# Patient Record
Sex: Female | Born: 1980 | Race: Black or African American | Hispanic: No | Marital: Married | Smoking: Never smoker
Health system: Southern US, Community
[De-identification: ages and names within clinical notes are randomized; demographics above are authoritative.]

## PROBLEM LIST (undated history)

## (undated) DIAGNOSIS — K8050 Calculus of bile duct without cholangitis or cholecystitis without obstruction: Secondary | ICD-10-CM

## (undated) DIAGNOSIS — R002 Palpitations: Secondary | ICD-10-CM

## (undated) DIAGNOSIS — E282 Polycystic ovarian syndrome: Secondary | ICD-10-CM

## (undated) DIAGNOSIS — R030 Elevated blood-pressure reading, without diagnosis of hypertension: Secondary | ICD-10-CM

## (undated) DIAGNOSIS — Z8619 Personal history of other infectious and parasitic diseases: Secondary | ICD-10-CM

## (undated) DIAGNOSIS — G4733 Obstructive sleep apnea (adult) (pediatric): Principal | ICD-10-CM

## (undated) DIAGNOSIS — E785 Hyperlipidemia, unspecified: Secondary | ICD-10-CM

## (undated) DIAGNOSIS — E538 Deficiency of other specified B group vitamins: Principal | ICD-10-CM

## (undated) DIAGNOSIS — I10 Essential (primary) hypertension: Secondary | ICD-10-CM

## (undated) HISTORY — DX: Personal history of other infectious and parasitic diseases: Z86.19

## (undated) HISTORY — DX: Deficiency of other specified B group vitamins: E53.8

## (undated) HISTORY — PX: TONSILLECTOMY: SUR1361

## (undated) HISTORY — PX: WISDOM TOOTH EXTRACTION: SHX21

## (undated) HISTORY — DX: Elevated blood-pressure reading, without diagnosis of hypertension: R03.0

## (undated) HISTORY — DX: Essential (primary) hypertension: I10

## (undated) HISTORY — DX: Calculus of bile duct without cholangitis or cholecystitis without obstruction: K80.50

## (undated) HISTORY — DX: Obstructive sleep apnea (adult) (pediatric): G47.33

## (undated) HISTORY — DX: Hyperlipidemia, unspecified: E78.5

## (undated) HISTORY — DX: Palpitations: R00.2

## (undated) HISTORY — PX: DILATION AND CURETTAGE OF UTERUS: SHX78

---
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
## 2009-04-26 ENCOUNTER — Ambulatory Visit (HOSPITAL_COMMUNITY): Admission: RE | Admit: 2009-04-26 | Discharge: 2009-04-26 | Payer: Self-pay | Admitting: Obstetrics and Gynecology

## 2009-04-26 ENCOUNTER — Encounter (INDEPENDENT_AMBULATORY_CARE_PROVIDER_SITE_OTHER): Payer: Self-pay | Admitting: Obstetrics and Gynecology

## 2010-09-20 LAB — BASIC METABOLIC PANEL
BUN: 6 mg/dL (ref 6–23)
Chloride: 105 mEq/L (ref 96–112)
GFR calc Af Amer: >60 mL/min (ref >60)
GFR calc non Af Amer: >60 mL/min (ref >60)
Potassium: 3.4 mEq/L — ABNORMAL LOW (ref 3.5–5.1)

## 2010-09-20 LAB — CBC
HCT: 35.5 % — ABNORMAL LOW (ref 36.0–46.0)
MCV: 78.4 fL (ref 78.0–100.0)
Platelets: 379 10*3/uL (ref 150–400)
RBC: 4.52 MIL/uL (ref 3.87–5.11)
WBC: 9.3 10*3/uL (ref 4.0–10.5)

## 2010-09-20 LAB — PREGNANCY, URINE: Preg Test, Ur: NEGATIVE

## 2010-10-30 ENCOUNTER — Emergency Department (HOSPITAL_BASED_OUTPATIENT_CLINIC_OR_DEPARTMENT_OTHER)
Admission: EM | Admit: 2010-10-30 | Discharge: 2010-10-30 | Disposition: A | Discharge status: Home / Self Care | Payer: Managed Care, Other (non HMO) | Attending: Emergency Medicine | Admitting: Emergency Medicine

## 2010-10-30 DIAGNOSIS — L089 Local infection of the skin and subcutaneous tissue, unspecified: Secondary | ICD-10-CM | POA: Insufficient documentation

## 2010-10-30 LAB — GLUCOSE, CAPILLARY

## 2011-03-19 LAB — HM PAP SMEAR: HM Pap smear: NORMAL

## 2011-05-15 ENCOUNTER — Emergency Department (HOSPITAL_BASED_OUTPATIENT_CLINIC_OR_DEPARTMENT_OTHER)
Admission: EM | Admit: 2011-05-15 | Discharge: 2011-05-15 | Disposition: A | Discharge status: Home / Self Care | Payer: Managed Care, Other (non HMO) | Attending: Emergency Medicine | Admitting: Emergency Medicine

## 2011-05-15 ENCOUNTER — Encounter: Payer: Self-pay | Admitting: Student

## 2011-05-15 ENCOUNTER — Emergency Department (INDEPENDENT_AMBULATORY_CARE_PROVIDER_SITE_OTHER): Payer: Managed Care, Other (non HMO)

## 2011-05-15 DIAGNOSIS — R059 Cough, unspecified: Secondary | ICD-10-CM

## 2011-05-15 DIAGNOSIS — R05 Cough: Secondary | ICD-10-CM

## 2011-05-15 DIAGNOSIS — R0989 Other specified symptoms and signs involving the circulatory and respiratory systems: Secondary | ICD-10-CM

## 2011-05-15 HISTORY — DX: Polycystic ovarian syndrome: E28.2

## 2011-05-15 MED ORDER — HYDROCOD POLST-CHLORPHEN POLST 10-8 MG/5ML PO LQCR: 5.0000 mL | Freq: Two times a day (BID) | ORAL | Status: DC | PRN

## 2011-05-15 NOTE — ED Notes (Addendum)
Pt in with c/o URI s/sx x several days with associated cough,congestion, nasal congestion, aches and chills. Pt also reports sore throat and recent exposure to flu and pneumonia from co workers.

## 2011-05-15 NOTE — ED Provider Notes (Signed)
History     CSN: [id number] Arrival date & time: 05/15/2011  4:20 PM   First MD Initiated Contact with Patient 05/15/11 1627      Chief Complaint  Patient presents with  . URI    (Consider location/radiation/quality/duration/timing/severity/associated sxs/prior treatment) Patient is a 30 y.o. female presenting with URI. The history is provided by the patient. No language interpreter was used.  URI The primary symptoms include sore throat and cough. Primary symptoms do not include nausea or vomiting. The current episode started 3 to 5 days ago. This is a new problem. The problem has not changed since onset. Symptoms associated with the illness include rhinorrhea. The illness is not associated with facial pain.    Past Medical History  Diagnosis Date  . PCOS (polycystic ovarian syndrome)     Past Surgical History  Procedure Date  . Tonsillectomy   . Wisdom tooth extraction   . Dilation and curettage of uterus     No family history on file.  History  Substance Use Topics  . Smoking status: Never Smoker   . Smokeless tobacco: Not on file  . Alcohol Use: Yes    OB History    Grav Para Term Preterm Abortions TAB SAB Ect Mult Living                  Review of Systems  HENT: Positive for sore throat and rhinorrhea.   Respiratory: Positive for cough.   Gastrointestinal: Negative for nausea and vomiting.  All other systems reviewed and are negative.    Allergies  Flagyl and Other  Home Medications   Current Outpatient Rx  Name Route Sig Dispense Refill  . ZICAM COLD REMEDY PO TBDP Oral Take 1 tablet by mouth every 3 (three) hours as needed. For congestion     . MEDROXYPROGESTERONE ACETATE 10 MG PO TABS Oral Take 10 mg by mouth daily.        BP 140/76  Pulse 125  Temp(Src) 98.5 F (36.9 C) (Oral)  Resp 18  Wt 279 lb (126.554 kg)  SpO2 98%  LMP 08/14/2010  Physical Exam  Nursing note and vitals reviewed. Constitutional: She appears well-developed and  well-nourished.  HENT:  Head: Normocephalic and atraumatic.  Right Ear: External ear normal.  Left Ear: External ear normal.  Nose: Rhinorrhea present.  Mouth/Throat: Posterior oropharyngeal erythema present.  Neck: Normal range of motion. Neck supple.  Cardiovascular: Normal rate and regular rhythm.   Pulmonary/Chest: Effort normal and breath sounds normal.  Musculoskeletal: Normal range of motion.  Neurological: She is alert.    ED Course  Procedures (including critical care time)  Labs Reviewed - No data to display Dg Chest 2 View  05/15/2011  *RADIOLOGY REPORT*  Clinical Data: Cough and chest congestion.  CHEST - 2 VIEW  Comparison: None.  Findings: Normal sized heart.  Clear lungs.  Minimal scoliosis.  IMPRESSION: No acute abnormality.  Original Report Authenticated By: Darrol Angel, M.D.     1. Cough       MDM  No acute finding noted  On x-ray will treat symptomatically:symptoms likely viral        Teressa Lower, NP 05/15/11 1830

## 2011-05-16 NOTE — ED Provider Notes (Signed)
Medical screening examination/treatment/procedure(s) were performed by non-physician practitioner and as supervising physician I was immediately available for consultation/collaboration.   Darneshia Demary, MD 05/16/11 0012 

## 2011-11-12 ENCOUNTER — Encounter (HOSPITAL_BASED_OUTPATIENT_CLINIC_OR_DEPARTMENT_OTHER): Payer: Self-pay | Admitting: *Deleted

## 2011-11-12 ENCOUNTER — Emergency Department (HOSPITAL_BASED_OUTPATIENT_CLINIC_OR_DEPARTMENT_OTHER)
Admission: EM | Admit: 2011-11-12 | Discharge: 2011-11-12 | Disposition: A | Discharge status: Home / Self Care | Payer: Managed Care, Other (non HMO) | Attending: Emergency Medicine | Admitting: Emergency Medicine

## 2011-11-12 DIAGNOSIS — J040 Acute laryngitis: Secondary | ICD-10-CM | POA: Insufficient documentation

## 2011-11-12 DIAGNOSIS — J309 Allergic rhinitis, unspecified: Secondary | ICD-10-CM | POA: Insufficient documentation

## 2011-11-12 MED ORDER — FLUTICASONE PROPIONATE 50 MCG/ACT NA SUSP: 2.0000 | Freq: Every day | NASAL | Status: DC

## 2011-11-12 NOTE — ED Notes (Signed)
MD at bedside. 

## 2011-11-12 NOTE — ED Notes (Signed)
Pt amb to triage with quick steady gait in nad. Pt reports hoarse voice x 5 days, denies sore throat or fevers or any other c/o.

## 2011-11-12 NOTE — ED Provider Notes (Signed)
History     CSN: [id number]  Arrival date & time 11/12/11  1506   First MD Initiated Contact with Patient 11/12/11 1517      Chief Complaint  Patient presents with  . Hoarse    (Consider location/radiation/quality/duration/timing/severity/associated sxs/prior treatment) HPI 31 yo female with history of seasonal allergies here with complaint of hoarseness.  States that symptoms began ~4-5 days ago.  Symptoms are starting to improve  She has had nasal congestion and drainage along with mild cough.  She denies any difficulty swallowing of shortness of breath.  No nausea or reflux symptoms.  Past Medical History  Diagnosis Date  . PCOS (polycystic ovarian syndrome)     Past Surgical History  Procedure Date  . Tonsillectomy   . Wisdom tooth extraction   . Dilation and curettage of uterus     History reviewed. No pertinent family history.  History  Substance Use Topics  . Smoking status: Never Smoker   . Smokeless tobacco: Not on file  . Alcohol Use: Yes    OB History    Grav Para Term Preterm Abortions TAB SAB Ect Mult Living                  Review of Systems  Constitutional: Negative for fever.  HENT: Positive for congestion, voice change and postnasal drip. Negative for sore throat and trouble swallowing.   Respiratory: Positive for cough. Negative for chest tightness and shortness of breath.   Cardiovascular: Negative for chest pain.    Allergies  Flagyl and Other  Home Medications   Current Outpatient Rx  Name Route Sig Dispense Refill  . LOESTRIN 24 FE PO Oral Take 1 tablet by mouth daily.    Marland Kitchen FLUTICASONE PROPIONATE 50 MCG/ACT NA SUSP Nasal Place 2 sprays into the nose daily. 16 g 0  . MEDROXYPROGESTERONE ACETATE 10 MG PO TABS Oral Take 10 mg by mouth daily.        BP 153/100  Pulse 100  Temp(Src) 98.5 F (36.9 C) (Oral)  Resp 18  Ht 5\' 4"  (1.626 m)  Wt 270 lb (122.471 kg)  BMI 46.35 kg/m2  SpO2 99%  LMP 10/13/2011  Physical Exam    Constitutional: She is oriented to person, place, and time. She appears well-nourished. No distress.       Hoarseness with speaking.   HENT:  Head: Normocephalic and atraumatic.  Mouth/Throat: Oropharynx is clear and moist.  Neck: Neck supple.  Cardiovascular: Normal rate, regular rhythm and normal heart sounds.   Pulmonary/Chest: Effort normal and breath sounds normal. No respiratory distress. She has no wheezes.  Lymphadenopathy:    She has no cervical adenopathy.  Neurological: She is alert and oriented to person, place, and time.    ED Course  Procedures (including critical care time)  Labs Reviewed - No data to display No results found.   1. Allergic rhinitis   2. Laryngitis       MDM  Laryngitis likely induced by post nasal drip.  No symptoms of reflux.  Could be viral, given symptoms of congestion, mild cough.  Will give her rx for for flonase to see if this helps improve her symptoms as she likely has some allergic rhinitis contributing to post nasal drip.  3:54 PM         Everrett Coombe, DO 11/12/11 1554

## 2011-11-12 NOTE — Discharge Instructions (Signed)
Allergic Rhinitis  Allergic rhinitis is when the mucous membranes in the nose respond to allergens. Allergens are particles in the air that cause your body to have an allergic reaction. This causes you to release allergic antibodies. Through a chain of events, these eventually cause you to release histamine into the blood stream (hence the use of antihistamines). Although meant to be protective to the body, it is this release that causes your discomfort, such as frequent sneezing, congestion and an itchy runny nose.    CAUSES    The pollen allergens may come from grasses, trees, and weeds. This is seasonal allergic rhinitis, or "hay fever." Other allergens cause year-round allergic rhinitis (perennial allergic rhinitis) such as house dust mite allergen, pet dander and mold spores.    SYMPTOMS     Nasal stuffiness (congestion).   Runny, itchy nose with sneezing and tearing of the eyes.   There is often an itching of the mouth, eyes and ears.  It cannot be cured, but it can be controlled with medications.  DIAGNOSIS    If you are unable to determine the offending allergen, skin or blood testing may find it.  TREATMENT     Avoid the allergen.   Medications and allergy shots (immunotherapy) can help.   Hay fever may often be treated with antihistamines in pill or nasal spray forms. Antihistamines block the effects of histamine. There are over-the-counter medicines that may help with nasal congestion and swelling around the eyes. Check with your caregiver before taking or giving this medicine.  If the treatment above does not work, there are many new medications your caregiver can prescribe. Stronger medications may be used if initial measures are ineffective. Desensitizing injections can be used if medications and avoidance fails. Desensitization is when a patient is given ongoing shots until the body becomes less sensitive to the allergen. Make sure you follow up with your caregiver if problems continue.  SEEK  MEDICAL CARE IF:     You develop fever (more than 100.5 F (38.1 C).   You develop a cough that does not stop easily (persistent).   You have shortness of breath.   You start wheezing.   Symptoms interfere with normal daily activities.  Document Released: 02/27/2001 Document Revised: 05/24/2011 Document Reviewed: 09/08/2008  Exit[employer] Patient Information 2012 [employer].

## 2011-11-12 NOTE — ED Provider Notes (Signed)
I , reviewed the resident's note and I agree with the findings and plan.     Nelia Shi, MD 11/12/11 [phone]

## 2012-02-27 ENCOUNTER — Telehealth: Payer: Self-pay | Admitting: *Deleted

## 2012-02-27 ENCOUNTER — Telehealth: Payer: Self-pay | Admitting: Family

## 2012-02-27 ENCOUNTER — Encounter: Payer: Self-pay | Admitting: Family

## 2012-02-27 ENCOUNTER — Ambulatory Visit (INDEPENDENT_AMBULATORY_CARE_PROVIDER_SITE_OTHER): Payer: Managed Care, Other (non HMO) | Admitting: Family

## 2012-02-27 VITALS — BP 140/98 | HR 98 | Temp 98.5°F | Resp 16 | Ht 64.5 in | Wt 266.0 lb

## 2012-02-27 DIAGNOSIS — Z23 Encounter for immunization: Secondary | ICD-10-CM

## 2012-02-27 DIAGNOSIS — E282 Polycystic ovarian syndrome: Secondary | ICD-10-CM | POA: Insufficient documentation

## 2012-02-27 DIAGNOSIS — I10 Essential (primary) hypertension: Secondary | ICD-10-CM | POA: Insufficient documentation

## 2012-02-27 DIAGNOSIS — Z0000 Encounter for general adult medical examination without abnormal findings: Secondary | ICD-10-CM | POA: Insufficient documentation

## 2012-02-27 DIAGNOSIS — R030 Elevated blood-pressure reading, without diagnosis of hypertension: Secondary | ICD-10-CM

## 2012-02-27 DIAGNOSIS — R002 Palpitations: Secondary | ICD-10-CM | POA: Insufficient documentation

## 2012-02-27 DIAGNOSIS — M722 Plantar fascial fibromatosis: Secondary | ICD-10-CM | POA: Insufficient documentation

## 2012-02-27 MED ORDER — EFLORNITHINE HCL 13.9 % EX CREA: TOPICAL_CREAM | CUTANEOUS | Status: DC

## 2012-02-27 NOTE — Progress Notes (Signed)
  Subjective:    Patient ID: Tina Figueroa, female    DOB: [birth year], 31 y.o.   MRN: [id number]  HPI  [name] is a 31 yr old female who presents today to establish care.  She reports that on Saturday she was walking in flip flops and stepped on a nail. Nail barely broke the skin.  She is requesting a tetanus shot. Sunday.  Last tetanus was 2001.    Preventative- Dr. Cherly Hensen- GYN.  Not exercising.  Diet is fair.  Pap is up to date.  Mother died 5 weeks ago from heart failure complications. She is very upset about this.    Hx PCOS- reports intolerant to metformin.  Has facial hair that she would like addressed by dermatology.  Plantar fasciitis- has hx and reports that this has worsened.  She is requesting referral to podiatry.  Hx of palpitations- reports episodes of palpitations of and on for years.  She is concerned about her mother's cardiac hx and is requesting referral to cardiology.    Review of Systems  Constitutional:       Reports 15 pound weight loss in 1 month since death of her mother  HENT: Negative for hearing loss.   Eyes: Negative for visual disturbance.  Respiratory: Negative for shortness of breath.   Cardiovascular: Negative for chest pain.  Gastrointestinal: Negative for nausea, vomiting and diarrhea.  Genitourinary: Negative for dysuria and frequency.       Periods are irregular- plans follow up with Dr. Cherly Hensen.  Musculoskeletal: Negative for myalgias and arthralgias.  Skin: Negative for rash.  Neurological: Negative for headaches.  Hematological: Negative for adenopathy.  Psychiatric/Behavioral:       Denies depression/anxiety + stress       Objective:   Physical Exam Physical Exam  Constitutional: Pleasant AA female. She is oriented to person, place, and time. She appears overweight.No distress.  HENT:  Head: Normocephalic and atraumatic.  Right Ear: Tympanic membrane and ear canal normal.  Left Ear: Tympanic membrane and ear canal normal.    Mouth/Throat: Oropharynx is clear and moist.  Eyes: Pupils are equal, round, and reactive to light. No scleral icterus.  Neck: Normal range of motion. No thyromegaly present.  Cardiovascular: Normal rate and regular rhythm.   No murmur heard. Pulmonary/Chest: Effort normal and breath sounds normal. No respiratory distress. He has no wheezes. She has no rales. She exhibits no tenderness.  Abdominal: Soft. Bowel sounds are normal. He exhibits no distension and no mass. There is no tenderness. There is no rebound and no guarding.  Musculoskeletal: She exhibits no edema.  Lymphadenopathy:    She has no cervical adenopathy.  Neurological: She is alert and oriented to person, place, and time. She has normal reflexes. She exhibits normal muscle tone. Coordination normal.  Skin: Skin is warm and dry. , hirsuit on lower face/neck. Slight shallow break in skin of right sole.  No erythema.  Psychiatric: She has a normal mood and affect. Her behavior is normal. Judgment and thought content normal.  Breast/GYN: deferred          Assessment & Plan:         Assessment & Plan:

## 2012-02-27 NOTE — Telephone Encounter (Signed)
Yes, Tina Figueroa will arrange and call her with details.

## 2012-02-27 NOTE — Assessment & Plan Note (Signed)
Refer to podiatry

## 2012-02-27 NOTE — Telephone Encounter (Signed)
Pt aware.

## 2012-02-27 NOTE — Telephone Encounter (Signed)
Please call pt and let her know that I sent rx for Vaniqa for her to save her the trip to dermatology.  I recommend that she try this for a few months.  If no improvement, she would likely need to consider laser hair removal.

## 2012-02-27 NOTE — Patient Instructions (Addendum)
Please return fasting for lab work.   Follow up in 1 month.  Welcome to [facility]!

## 2012-02-27 NOTE — Assessment & Plan Note (Signed)
EKG in office today shows NSR.  Pt is requesting referral to cardiology.

## 2012-02-27 NOTE — Assessment & Plan Note (Signed)
?   White coat htn.  Repeat in 1 month.

## 2012-02-27 NOTE — Assessment & Plan Note (Addendum)
Pt counseled on healthy diet, exercise.  Tetanus today due to recent injury with nail.Declines flu shot.  Up to date on Pap.

## 2012-02-27 NOTE — Assessment & Plan Note (Addendum)
She follows with GYN.  Pt would like to try vaniqa for her facial hair.

## 2012-02-27 NOTE — Telephone Encounter (Signed)
Pt wanted to know if we were going to refer her to dermatology and podiatry?  Please advise.

## 2012-02-28 NOTE — Telephone Encounter (Signed)
Left message on home # for pt to call me back this afternoon.

## 2012-03-06 ENCOUNTER — Telehealth: Payer: Self-pay | Admitting: *Deleted

## 2012-03-06 DIAGNOSIS — Z0000 Encounter for general adult medical examination without abnormal findings: Secondary | ICD-10-CM

## 2012-03-06 DIAGNOSIS — R7303 Prediabetes: Secondary | ICD-10-CM

## 2012-03-06 NOTE — Telephone Encounter (Signed)
Future orders entered and given to the lab. 

## 2012-03-06 NOTE — Telephone Encounter (Signed)
Message copied by Kathi Simpers on Thu Mar 06, 2012  4:08 PM ------      Message from: O'SULLIVAN, MELISSA      Created: Wed Feb 27, 2012  3:33 PM       BMET      CBC      LFT      FLP      TSH      UA with reflex micro      V70.0            A1C (diagnosis borderline diabetes)

## 2012-03-11 ENCOUNTER — Encounter: Payer: Self-pay | Admitting: *Deleted

## 2012-03-11 ENCOUNTER — Encounter: Payer: Self-pay | Admitting: Cardiology

## 2012-03-11 NOTE — Telephone Encounter (Signed)
Reached pt on her cell# and notified her of instruction below. Pt voices understanding.

## 2012-03-12 ENCOUNTER — Ambulatory Visit (INDEPENDENT_AMBULATORY_CARE_PROVIDER_SITE_OTHER): Payer: Managed Care, Other (non HMO) | Admitting: Cardiology

## 2012-03-12 ENCOUNTER — Encounter: Payer: Self-pay | Admitting: Cardiology

## 2012-03-12 VITALS — BP 140/84 | HR 90 | Ht 64.0 in | Wt 264.0 lb

## 2012-03-12 DIAGNOSIS — R002 Palpitations: Secondary | ICD-10-CM

## 2012-03-12 NOTE — Patient Instructions (Addendum)
Your physician recommends that you schedule a follow-up appointment in: 6-8 WEEKS WITH DR Jens Som IN [place]  Your physician has requested that you have an echocardiogram. Echocardiography is a painless test that uses sound waves to create images of your heart. It provides your doctor with information about the size and shape of your heart and how well your heart's chambers and valves are working. This procedure takes approximately one hour. There are no restrictions for this procedure.AT [facility] OFFICE  Your physician has recommended that you wear an event monitor. Event monitors are medical devices that record the heart's electrical activity. Doctors most often [place] these monitors to diagnose arrhythmias. Arrhythmias are problems with the speed or rhythm of the heartbeat. The monitor is a small, portable device. You can wear one while you do your normal daily activities. This is usually used to diagnose what is causing palpitations/syncope (passing out). AT [facility] OFFICE

## 2012-03-12 NOTE — Assessment & Plan Note (Signed)
Etiology unclear. Will arrange [facility] to further evaluate. Obtain records from [facility] as she sounds to potentially have had SVT in the past. Her primary care physician has ordered a TSH which is pending. Schedule echocardiogram to quantify LV function. Further recommendations based on above.

## 2012-03-12 NOTE — Progress Notes (Signed)
HPI: 31 year old female for evaluation of palpitations. Patient states that in 2004 she was seen at [facility] with what sounds to be potentially SVT. I do not have those records available. She lost her mother in July of this year. Since then she has had intermittent palpitations. They are sudden in onset and described as her heart racing and pounding. There is mild chest tightness but no dyspnea, presyncope or syncope. Her symptoms last approximately 30 minutes and resolve spontaneously. She otherwise denies dyspnea on exertion, orthopnea, PND, exertional chest pain or history of syncope. Occasional mild pedal edema.  Current Outpatient Prescriptions  Medication Sig Dispense Refill  . Eflornithine HCl 13.9 % cream Apply a thin layer to affected areas on face/neck twice daily, at least 8 hrs apart. Rub in thoroughly. Do not wash treated area for at least 4 hrs.  45 g  2  . fluticasone (FLONASE) 50 MCG/ACT nasal spray Place 2 sprays into the nose daily.  16 g  0  . Norethin Ace-Eth Estrad-FE (MINASTRIN 24 FE) 1-20 MG-MCG(24) CHEW Chew 1 tablet by mouth daily.      Marland Kitchen DISCONTD: Norethin Ace-Eth Estrad-FE (LOESTRIN 24 FE PO) Take 1 tablet by mouth daily.        Allergies  Allergen Reactions  . Flagyl (Metronidazole Hcl)     Chalky and coated tongue  . Other     Past Medical History  Diagnosis Date  . PCOS (polycystic ovarian syndrome)   . History of chicken pox   . Elevated blood pressure reading without diagnosis of hypertension   . Palpitations     Past Surgical History  Procedure Date  . Tonsillectomy   . Wisdom tooth extraction   . Dilation and curettage of uterus   . Adenoidectomy 1989    History   Social History  . Marital Status: Single    Spouse Name: N/A    Number of Children: 0  . Years of Education: N/A   Occupational History  .     Social History Main Topics  . Smoking status: Never Smoker   . Smokeless tobacco: Never Used  . Alcohol Use: Yes   Rarely  . Drug Use: Not on file  . Sexually Active: Not on file   Other Topics Concern  . 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 Heart disease Mother     CHF  . Hypertension Mother   . Depression Mother   . Arthritis Father   . Diabetes Maternal Aunt   . Arthritis Maternal Uncle   . Diabetes Paternal Uncle   . Arthritis Maternal Grandmother   . Cancer Maternal Grandmother     lung  . Arthritis Paternal Grandmother   . Cancer Paternal Grandmother     breast  . Stroke Paternal Grandmother   . Diabetes Paternal Grandmother   . Cancer Paternal Grandfather     colon and prostate  . Cancer Other     aunt    ROS:  no fevers or chills, productive cough, hemoptysis, dysphasia, odynophagia, melena, hematochezia, dysuria, hematuria, rash, seizure activity, orthopnea, PND, claudication. Remaining systems are negative.  Physical Exam:   Blood pressure 140/84, pulse 90, height 5\' 4"  (1.626 m), weight 264 lb (119.75 kg), last menstrual period 02/23/2012.  General:  Well developed/obese in NAD Skin warm/dry Patient not depressed No peripheral clubbing Back-normal HEENT-normal/normal eyelids Neck supple/normal carotid upstroke bilaterally; no bruits; no JVD; no thyromegaly chest - CTA/ normal expansion CV - RRR/normal S1 and S2; no murmurs, rubs or gallops;  PMI nondisplaced Abdomen -NT/ND, no HSM, no mass, + bowel sounds, no bruit 2+ femoral pulses, no bruits Ext-no edema, chords, 2+ DP Neuro-grossly nonfocal  ECG 02/27/2012-sinus rhythm with no ST changes.  '

## 2012-03-17 LAB — URINALYSIS, ROUTINE W REFLEX MICROSCOPIC
Glucose, UA: NEGATIVE mg/dL
Hgb urine dipstick: NEGATIVE
Leukocytes, UA: NEGATIVE
Nitrite: NEGATIVE
Protein, ur: NEGATIVE mg/dL
Urobilinogen, UA: 1 mg/dL (ref 0.0–1.0)

## 2012-03-17 LAB — HEPATIC FUNCTION PANEL
Alkaline Phosphatase: 79 U/L (ref 39–117)
Bilirubin, Direct: 0.2 mg/dL (ref 0.0–0.3)
Indirect Bilirubin: 0.5 mg/dL (ref 0.0–0.9)

## 2012-03-17 LAB — CBC WITH DIFFERENTIAL/PLATELET
Basophils Absolute: 0 10*3/uL (ref 0.0–0.1)
Basophils Relative: 0 % (ref 0–1)
MCHC: 35.4 g/dL (ref 30.0–36.0)
Neutro Abs: 5.4 10*3/uL (ref 1.7–7.7)
Neutrophils Relative %: 66 % (ref 43–77)
Platelets: 338 10*3/uL (ref 150–400)
RDW: 12.3 % (ref 11.5–15.5)
WBC: 8.3 10*3/uL (ref 4.0–10.5)

## 2012-03-17 LAB — TSH: TSH: 2.83 u[IU]/mL (ref 0.350–4.500)

## 2012-03-17 LAB — BASIC METABOLIC PANEL
Calcium: 9.1 mg/dL (ref 8.4–10.5)
Chloride: 104 mEq/L (ref 96–112)
Creat: 0.73 mg/dL (ref 0.50–1.10)
Sodium: 137 mEq/L (ref 135–145)

## 2012-03-17 LAB — LIPID PANEL
HDL: 44 mg/dL (ref >39)
LDL Cholesterol: 166 mg/dL — ABNORMAL HIGH (ref 0–99)
Triglycerides: 59 mg/dL (ref <150)
VLDL: 12 mg/dL (ref 0–40)

## 2012-03-17 NOTE — Addendum Note (Signed)
Addended by: Mervin Kung A on: 03/17/2012 09:40 AM   Modules accepted: Orders

## 2012-03-17 NOTE — Telephone Encounter (Signed)
Pt presented to the lab, future orders released. 

## 2012-03-18 ENCOUNTER — Encounter: Payer: Self-pay | Admitting: Family

## 2012-03-18 ENCOUNTER — Encounter (INDEPENDENT_AMBULATORY_CARE_PROVIDER_SITE_OTHER): Payer: Managed Care, Other (non HMO)

## 2012-03-18 ENCOUNTER — Ambulatory Visit (HOSPITAL_COMMUNITY): Payer: Managed Care, Other (non HMO) | Attending: Cardiology

## 2012-03-18 DIAGNOSIS — E282 Polycystic ovarian syndrome: Secondary | ICD-10-CM

## 2012-03-18 DIAGNOSIS — R002 Palpitations: Secondary | ICD-10-CM

## 2012-03-18 DIAGNOSIS — E785 Hyperlipidemia, unspecified: Secondary | ICD-10-CM

## 2012-03-18 DIAGNOSIS — I379 Nonrheumatic pulmonary valve disorder, unspecified: Secondary | ICD-10-CM | POA: Insufficient documentation

## 2012-03-18 DIAGNOSIS — I059 Rheumatic mitral valve disease, unspecified: Secondary | ICD-10-CM | POA: Insufficient documentation

## 2012-03-18 HISTORY — DX: Hyperlipidemia, unspecified: E78.5

## 2012-03-18 MED ORDER — EFLORNITHINE HCL 13.9 % EX CREA: TOPICAL_CREAM | CUTANEOUS | Status: DC

## 2012-03-18 NOTE — Progress Notes (Signed)
Echocardiogram performed.  

## 2012-03-18 NOTE — Telephone Encounter (Signed)
Per pt, previous rx did not transmit to pharmacy. Verified with pharmacist that they did not receive Rx. Called rx to Sam at [facility] and changed quantity to 30gm as he stated he could only get it in a 30g tube.

## 2012-03-20 ENCOUNTER — Encounter (HOSPITAL_COMMUNITY): Payer: Managed Care, Other (non HMO)

## 2012-03-25 ENCOUNTER — Ambulatory Visit (INDEPENDENT_AMBULATORY_CARE_PROVIDER_SITE_OTHER): Payer: Managed Care, Other (non HMO) | Admitting: Family

## 2012-03-25 ENCOUNTER — Encounter: Payer: Self-pay | Admitting: Family

## 2012-03-25 VITALS — BP 120/98 | HR 94 | Temp 98.9°F | Resp 16 | Ht 64.5 in | Wt 266.1 lb

## 2012-03-25 DIAGNOSIS — R002 Palpitations: Secondary | ICD-10-CM

## 2012-03-25 DIAGNOSIS — Z23 Encounter for immunization: Secondary | ICD-10-CM

## 2012-03-25 DIAGNOSIS — R404 Transient alteration of awareness: Secondary | ICD-10-CM

## 2012-03-25 DIAGNOSIS — G4733 Obstructive sleep apnea (adult) (pediatric): Secondary | ICD-10-CM

## 2012-03-25 DIAGNOSIS — R400 Somnolence: Secondary | ICD-10-CM

## 2012-03-25 DIAGNOSIS — R030 Elevated blood-pressure reading, without diagnosis of hypertension: Secondary | ICD-10-CM

## 2012-03-25 DIAGNOSIS — R0683 Snoring: Secondary | ICD-10-CM

## 2012-03-25 DIAGNOSIS — R0609 Other forms of dyspnea: Secondary | ICD-10-CM

## 2012-03-25 HISTORY — DX: Obstructive sleep apnea (adult) (pediatric): G47.33

## 2012-03-25 MED ORDER — HYDROCHLOROTHIAZIDE 25 MG PO TABS: 25.0000 mg | ORAL_TABLET | Freq: Every day | ORAL | Status: DC

## 2012-03-25 NOTE — Assessment & Plan Note (Signed)
Will order home sleep study

## 2012-03-25 NOTE — Assessment & Plan Note (Signed)
Complete event monitor- management per cardiology.

## 2012-03-25 NOTE — Patient Instructions (Addendum)
You will be contact about your referral.  Please let us know if you have not heard back within 1 week about your referral. Please follow up in 2 weeks.

## 2012-03-25 NOTE — Progress Notes (Signed)
Subjective:    Patient ID: Tina Figueroa, female    DOB: December 12, 1980, 31 y.o.   MRN: [id number]  HPI   Ms.  Weyland is a 31 yr old female who presents today for follow up of her blood pressure.   BP Readings from Last 3 Encounters:  03/25/12 120/98  03/12/12 140/84  02/27/12 140/98   Palpitations- reports that she is now wearing the event monitor.  She has experienced some palpitations since she started wearing monitor.   Snoring- reports that she has loud snoring and has been told that her breathing pauses when she sleeps. She is requesting to be tested for OSA.  Review of Systems See HPI  Past Medical History  Diagnosis Date  . PCOS (polycystic ovarian syndrome)   . History of chicken pox   . Elevated blood pressure reading without diagnosis of hypertension   . Palpitations   . Mild hyperlipidemia 03/18/2012    History   Social History  . Marital Status: Single    Spouse Name: N/A    Number of Children: 0  . Years of Education: N/A   Occupational History  .     Social History Main Topics  . Smoking status: Never Smoker   . Smokeless tobacco: Never Used  . Alcohol Use: Yes     Rarely  . Drug Use: Not on file  . Sexually Active: Not on file   Other Topics Concern  . 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 Tonsillectomy   . Wisdom tooth extraction   . Dilation and curettage of uterus   . Adenoidectomy 1989    Family History  Problem Relation Age of Onset  . Heart disease Mother     CHF  . Hypertension Mother   . Depression Mother   . Arthritis Father   . Diabetes Maternal Aunt   . Arthritis Maternal Uncle   . Diabetes Paternal Uncle   . Arthritis Maternal Grandmother   . Cancer Maternal Grandmother     lung  . Arthritis Paternal  Grandmother   . Cancer Paternal Grandmother     breast  . Stroke Paternal Grandmother   . Diabetes Paternal Grandmother   . Cancer Paternal Grandfather     colon and prostate  . Cancer Other     aunt    Allergies  Allergen Reactions  . Flagyl (Metronidazole Hcl)     Chalky and coated tongue  . Other     Current Outpatient Prescriptions on File Prior to Visit  Medication Sig Dispense Refill  . Norethin Ace-Eth Estrad-FE (MINASTRIN 24 FE) 1-20 MG-MCG(24) CHEW Chew 1 tablet by mouth daily.      . Eflornithine HCl 13.9 % cream Apply a thin layer to affected areas on face/neck twice daily, at least 8 hrs apart. Rub in thoroughly. Do not wash treated area for at least 4 hrs.  30 g  2  . fluticasone (FLONASE) 50 MCG/ACT nasal spray Place 2 sprays into the nose daily.  16 g  0  . hydrochlorothiazide (HYDRODIURIL) 25 MG tablet Take 1 tablet (25 mg total) by mouth daily.  30 tablet  0    BP 120/98  Pulse 94  Temp 98.9 F (37.2 C) (Oral)  Resp 16  Ht 5' 4.5" (  1.638 m)  Wt 266 lb 1.9 oz (120.711 kg)  BMI 44.97 kg/m2  SpO2 96%  LMP 02/23/2012       Objective:   Physical Exam  Constitutional: She appears well-developed and well-nourished. No distress.  Cardiovascular: Normal rate and regular rhythm.   No murmur heard. Musculoskeletal: She exhibits no edema.  Skin: Skin is warm and dry.  Psychiatric: She has a normal mood and affect. Her behavior is normal. Judgment and thought content normal.          Assessment & Plan:

## 2012-03-25 NOTE — Assessment & Plan Note (Signed)
Add hctz.  Follow up in 2 weeks for BP check and f/u BMET.

## 2012-03-26 ENCOUNTER — Telehealth: Payer: Self-pay | Admitting: *Deleted

## 2012-03-26 ENCOUNTER — Telehealth: Payer: Self-pay | Admitting: Cardiology

## 2012-03-26 MED ORDER — METOPROLOL SUCCINATE ER 25 MG PO TB24: 25.0000 mg | ORAL_TABLET | Freq: Every day | ORAL | Status: DC

## 2012-03-26 MED ORDER — ASPIRIN EC 81 MG PO TBEC: 81.0000 mg | DELAYED_RELEASE_TABLET | Freq: Every day | ORAL | Status: DC

## 2012-03-26 NOTE — Telephone Encounter (Signed)
Pt has quick question

## 2012-03-26 NOTE — Telephone Encounter (Signed)
Follow-up:    Patient returned your call.  Please call back. 

## 2012-03-26 NOTE — Telephone Encounter (Signed)
Spoke with pt, her PCP started her on HCTZ yesterday and the okay was given for pt to take all meds together.

## 2012-03-26 NOTE — Telephone Encounter (Signed)
Spoke with pt, aware of monitor results and new meds. appt made for pt to see dr Graciela Husbands to discuss ablation.

## 2012-03-26 NOTE — Telephone Encounter (Signed)
Left message for pt to call, pt is currently wearing a monitor which showed an episode of SVT. Per dr Jens Som pt to start toprol 25 mg once daily and aspirin 81 mg once daily. We will make a referral for the pt to see EP for consideration of ablation.

## 2012-04-01 ENCOUNTER — Ambulatory Visit (HOSPITAL_BASED_OUTPATIENT_CLINIC_OR_DEPARTMENT_OTHER): Payer: Managed Care, Other (non HMO) | Attending: Family | Admitting: Radiology

## 2012-04-01 VITALS — Ht 64.0 in | Wt 264.0 lb

## 2012-04-01 DIAGNOSIS — G4733 Obstructive sleep apnea (adult) (pediatric): Secondary | ICD-10-CM | POA: Insufficient documentation

## 2012-04-01 DIAGNOSIS — R400 Somnolence: Secondary | ICD-10-CM

## 2012-04-05 DIAGNOSIS — G4733 Obstructive sleep apnea (adult) (pediatric): Secondary | ICD-10-CM

## 2012-04-05 NOTE — Procedures (Signed)
Tina Figueroa, SHORB                  ACCOUNT NO.:  [id number]  MEDICAL RECORD NO.:  [id number]          PATIENT TYPE:  OUT  LOCATION:  SLEEP CENTER                 FACILITY:  [facility]  PHYSICIAN:  Hildy Nicholl D. Maple Hudson, MD, FCCP, FACPDATE OF BIRTH:  12-09-1978  DATE OF STUDY:  04/01/2012                           NOCTURNAL POLYSOMNOGRAM  REFERRING PHYSICIAN:  Sandford Craze, NP  REFERRING PRACTITIONER:  Sandford Craze, NP  This is an unattended home sleep study.  INDICATION FOR STUDY:  Hypersomnia with snoring, witnessed apnea, and complained of waking up choking.  EPWORTH SLEEPINESS SCORE:  7/24.  BMI 45, weight 264 pounds, height 5 feet 4 inches, neck 16.5 inches.  MEDICATIONS:  Home medications are charted and reviewed.  SLEEP ARCHITECTURE AND RESPIRATORY DATA:  See document text in epic media file.  IMPRESSION/RECOMMENDATION: 1. Severe obstructive sleep apnea/hypopnea syndrome, AHI 34.1 per     hour. 2. Moderate snoring with oxygen desaturation to a nadir of 85% and     mean oxygen saturation through the study of 94% on room air. 3. Mean heart rate 84 per minute with maximum heart rate 123 per     minute.  Rhythm assessment not available.  RECOMMENDATIONS: 1. Scores in this range would most commonly be addressed with CPAP,     with weight loss as a long-term goal.  Dedicated CPAP titration can     be arranged through the sleep disorder center if appropriate.     Secondary therapies including oral appliances and surgery may be     applicable on an individual basis. 2. The data file for this unattended home sleep study is in the media     file of epic.     Leontine Radman D. Maple Hudson, MD, Tonny Bollman, FACP Diplomate, American Board of Sleep Medicine    CDY/MEDQ  D:  04/05/2012 09:54:48  T:  04/05/2012 11:46:49  Job:  [id number]

## 2012-04-07 ENCOUNTER — Encounter: Payer: Self-pay | Admitting: Family

## 2012-04-07 ENCOUNTER — Ambulatory Visit (INDEPENDENT_AMBULATORY_CARE_PROVIDER_SITE_OTHER): Payer: Managed Care, Other (non HMO) | Admitting: Internal Medicine

## 2012-04-07 ENCOUNTER — Telehealth: Payer: Self-pay | Admitting: Family

## 2012-04-07 ENCOUNTER — Encounter: Payer: Self-pay | Admitting: Internal Medicine

## 2012-04-07 VITALS — BP 110/91 | HR 120 | Ht 64.5 in | Wt 264.8 lb

## 2012-04-07 DIAGNOSIS — I498 Other specified cardiac arrhythmias: Secondary | ICD-10-CM

## 2012-04-07 DIAGNOSIS — E8881 Metabolic syndrome: Secondary | ICD-10-CM | POA: Insufficient documentation

## 2012-04-07 DIAGNOSIS — R000 Tachycardia, unspecified: Secondary | ICD-10-CM | POA: Insufficient documentation

## 2012-04-07 DIAGNOSIS — G4733 Obstructive sleep apnea (adult) (pediatric): Secondary | ICD-10-CM

## 2012-04-07 DIAGNOSIS — R030 Elevated blood-pressure reading, without diagnosis of hypertension: Secondary | ICD-10-CM

## 2012-04-07 NOTE — Telephone Encounter (Signed)
Pls call pt and let her know that her sleep study shows severe sleep apnea.  I will refer her to pulmonary so that they can help get her set up with a CPAP mask.

## 2012-04-07 NOTE — Assessment & Plan Note (Signed)
As above.

## 2012-04-07 NOTE — Progress Notes (Signed)
ELECTROPHYSIOLOGY CONSULT NOTE  Patient ID: Tina Figueroa, MRN: [id number], DOB/AGE: 1980/12/14 30 y.o. Admit date: (Not on file) Date of Consult: 04/07/2012  Primary Physician: [name]., NP Primary Cardiologist:  [facility] Chief Complaint: Palpitations   HPI Tina Figueroa is a 31 y.o. female seen at the request of Dr. Marsa Aris because of documented SVT.  She notes a history of abrupt onset offset palpitations a couple of years ago. This occurred in the context of a separation from her accident. It was then quiet for some time and then reoccurred in the context of her mother having passed away earlier this summer. She notes the only symptoms are palpitations. This is distinct from the history that she gave Dr. Everlene Other. There is no chest discomfort lightheadedness shortness of breath or syncope. She thinks it happens primarily with stress, and noted happened again on arrival here. Past Medical History  Diagnosis Date  . PCOS (polycystic ovarian syndrome)   . History of chicken pox   . Elevated blood pressure reading without diagnosis of hypertension   . Palpitations   . Mild hyperlipidemia 03/18/2012  . OSA (obstructive sleep apnea) 03/25/2012    Severe OSA per sleep study 10/13.       Surgical History:  Past Surgical History  Procedure Date  . Tonsillectomy   . Wisdom tooth extraction   . Dilation and curettage of uterus   . Adenoidectomy 1989     Home Meds: Prior to Admission medications   Medication Sig Start Date End Date Taking? Authorizing Provider  aspirin EC 81 MG tablet Take 1 tablet (81 mg total) by mouth daily. 03/26/12  Yes Lewayne Bunting, MD  Eflornithine HCl 13.9 % cream Apply a thin layer to affected areas on face/neck twice daily, at least 8 hrs apart. Rub in thoroughly. Do not wash treated area for at least 4 hrs. 03/18/12  Yes Sandford Craze, NP  fluticasone (FLONASE) 50 MCG/ACT nasal spray Place 2 sprays into the nose as needed. 11/12/11 11/11/12 Yes Everrett Coombe, DO  hydrochlorothiazide (HYDRODIURIL) 25 MG tablet Take 1 tablet (25 mg total) by mouth daily. 03/25/12  Yes Sandford Craze, NP  metoprolol succinate (TOPROL-XL) 25 MG 24 hr tablet Take 1 tablet (25 mg total) by mouth daily. 03/26/12  Yes Lewayne Bunting, MD  Multiple Vitamin (MULTIVITAMIN) tablet Take 1 tablet by mouth daily.   Yes Historical Provider, MD  Norethin Ace-Eth Estrad-FE (MINASTRIN 24 FE) 1-20 MG-MCG(24) CHEW Chew 1 tablet by mouth daily.   Yes Historical Provider, MD     Allergies:  Allergies  Allergen Reactions  . Flagyl (Metronidazole Hcl)     Chalky and coated tongue  . Other     History   Social History  . Marital Status: Single    Spouse Name: N/A    Number of Children: 0  . Years of Education: N/A   Occupational History  .     Social History Main Topics  . Smoking status: Never Smoker   . Smokeless tobacco: Never Used  . Alcohol Use: Yes     Rarely  . Drug Use: Not on file  . Sexually Active: Not on file   Other Topics Concern  . 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  Heart disease Mother     CHF  . Hypertension Mother   . Depression Mother   . Arthritis Father   . Diabetes Maternal Aunt   . Arthritis Maternal Uncle   . Diabetes Paternal Uncle   . Arthritis Maternal Grandmother   . Cancer Maternal Grandmother     lung  . Arthritis Paternal Grandmother   . Cancer Paternal Grandmother     breast  . Stroke Paternal Grandmother   . Diabetes Paternal Grandmother   . Cancer Paternal Grandfather     colon and prostate  . Cancer Other     aunt     ROS:  Please see the history of present illness.     All other systems reviewed and negative.    Physical Exam:  Blood pressure 143/96, pulse 103,  height 5' 4.5" (1.638 m), weight 264 lb 12.8 oz (120.112 kg). General: Well developed, morbidly obese African American age appearing female in no acute distress. Head: Normocephalic, atraumatic, sclera non-icteric, no xanthomas, nares are without discharge. Lymph Nodes:  none Back: without scoliosis/kyphosis, no CVA tendersness Neck: Negative for carotid bruits. JVD 6-7 cm  Lungs: Clear bilaterally to auscultation without wheezes, rales, or rhonchi. Breathing is unlabored. Heart: RRR with S1 S2. No  murmur , rubs, or gallops appreciated. Abdomen: Soft, non-tender, non-distended with normoactive bowel sounds. No hepatomegaly. No rebound/guarding. No obvious abdominal masses. Msk:  Strength and tone appear normal for age. Extremities: No clubbing or cyanosis. No angina he was a edema.  Distal pedal pulses are 2+ and equal bilaterally. Skin: Warm and Dry; mildly hirsuit Neuro: Alert and oriented X 3. CN III-XII intact Grossly normal sensory and motor function . Psych:  Responds to questions appropriately with a normal affect.      Labs:   Radiology/Studies:  No results found.  EKG:    from August demonstrates sinus rhythm at a rate of 91 with normal intervals   Assessment and Plan: Tina Figueroa

## 2012-04-07 NOTE — Assessment & Plan Note (Addendum)
Blood pressure is elevated. In the context of her PCOS, she is manifesting the metabolic syndrome and as such probably is appropriately treated with anticholesterol therapy. I will defer this to her primary care physician

## 2012-04-07 NOTE — Assessment & Plan Note (Signed)
  The patient's monitor strips are reviewed. They're most consistent with either an atrial tachycardia or sinus tachycardia. I prefer the diagnosis of the latter based on her history of intermittent palpitations, light today where her HR is recorded 110. Furthermore orthostatic vital signs today showed a positional change in heart rate up to about 125 beats per minute. Furthermore the event recorder demonstrated variable heart rates going from 100-50 beats per minute with stable P wave morphology again consistent with a sinus mechanism. As such, I recommend that we increase her beta blocker. We will change her from 25-50 mg. She is not having any untoward affects that she recognizes as far for metoprolol succinate. She is to follow up with Dr. Everlene Other. next month and further up titration can be accomplished as her blood pressure certainly needs further control.

## 2012-04-08 ENCOUNTER — Encounter: Payer: Self-pay | Admitting: Family

## 2012-04-08 ENCOUNTER — Ambulatory Visit (INDEPENDENT_AMBULATORY_CARE_PROVIDER_SITE_OTHER): Payer: Managed Care, Other (non HMO) | Admitting: Family

## 2012-04-08 VITALS — BP 120/78 | HR 85 | Temp 98.3°F | Resp 16 | Ht 64.5 in | Wt 263.0 lb

## 2012-04-08 DIAGNOSIS — I10 Essential (primary) hypertension: Secondary | ICD-10-CM

## 2012-04-08 DIAGNOSIS — G4733 Obstructive sleep apnea (adult) (pediatric): Secondary | ICD-10-CM

## 2012-04-08 DIAGNOSIS — I498 Other specified cardiac arrhythmias: Secondary | ICD-10-CM

## 2012-04-08 DIAGNOSIS — R000 Tachycardia, unspecified: Secondary | ICD-10-CM

## 2012-04-08 LAB — BASIC METABOLIC PANEL
Calcium: 9.3 mg/dL (ref 8.4–10.5)
Chloride: 103 mEq/L (ref 96–112)
Creat: 0.72 mg/dL (ref 0.50–1.10)

## 2012-04-08 MED ORDER — HYDROCHLOROTHIAZIDE 25 MG PO TABS: 25.0000 mg | ORAL_TABLET | Freq: Every day | ORAL | Status: DC

## 2012-04-08 MED ORDER — METOPROLOL SUCCINATE ER 50 MG PO TB24: 50.0000 mg | ORAL_TABLET | Freq: Every day | ORAL | Status: DC

## 2012-04-08 NOTE — Assessment & Plan Note (Signed)
BP looks great. Continue current meds. Obtain bmet.

## 2012-04-08 NOTE — Progress Notes (Signed)
Subjective:    Patient ID: Tina Figueroa, female    DOB: September 19, 1980, 32 y.o.   MRN: [id number]  HPI  Tina Figueroa is a 31 yr old female who presents today for follow up.  1) HTN- she is maintained on toprol XL.   BP Readings from Last 3 Encounters:  04/08/12 120/78  04/07/12 110/91  03/25/12 120/98    2) OSA- new finding per home sleep study. She has been referred to pulmonary for CPAP titration.   3) Sinus tachycardia- saw Dr. Graciela Husbands for EP consult yesterday and it was recommended that her toprol xl be increased from 25mg  to 50 mg.    Review of Systems See HPI  Past Medical History  Diagnosis Date  . PCOS (polycystic ovarian syndrome)   . History of chicken pox   . Elevated blood pressure reading without diagnosis of hypertension   . Palpitations   . Mild hyperlipidemia 03/18/2012  . OSA (obstructive sleep apnea) 03/25/2012    Severe OSA per sleep study 10/13.     History   Social History  . Marital Status: Single    Spouse Name: N/A    Number of Children: 0  . Years of Education: N/A   Occupational History  .     Social History Main Topics  . Smoking status: Never Smoker   . Smokeless tobacco: Never Used  . Alcohol Use: Yes     Rarely  . Drug Use: Not on file  . Sexually Active: Not on file   Other Topics Concern  . 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 Tonsillectomy   . Wisdom tooth extraction   . Dilation and curettage of uterus   . Adenoidectomy 1989    Family History  Problem Relation Age of Onset  . Heart disease Mother     CHF  . Hypertension Mother   . Depression Mother   . Arthritis Father   . Diabetes Maternal Aunt   . Arthritis Maternal Uncle   . Diabetes Paternal Uncle   . Arthritis Maternal Grandmother   . Cancer  Maternal Grandmother     lung  . Arthritis Paternal Grandmother   . Cancer Paternal Grandmother     breast  . Stroke Paternal Grandmother   . Diabetes Paternal Grandmother   . Cancer Paternal Grandfather     colon and prostate  . Cancer Other     aunt    Allergies  Allergen Reactions  . Flagyl (Metronidazole Hcl)     Chalky and coated tongue  . Other     Current Outpatient Prescriptions on File Prior to Visit  Medication Sig Dispense Refill  . aspirin EC 81 MG tablet Take 1 tablet (81 mg total) by mouth daily.  90 tablet  3  . Eflornithine HCl 13.9 % cream Apply a thin layer to affected areas on face/neck twice daily, at least 8 hrs apart. Rub in thoroughly. Do not wash treated area for at least 4 hrs.  30 g  2  . fluticasone (FLONASE) 50 MCG/ACT nasal spray Place 2 sprays into the nose as needed.      . Multiple Vitamin (MULTIVITAMIN) tablet Take 1 tablet by mouth daily.      . Norethin Ace-Eth Estrad-FE (MINASTRIN 24 FE) 1-20  MG-MCG(24) CHEW Chew 1 tablet by mouth daily.      Marland Kitchen DISCONTD: hydrochlorothiazide (HYDRODIURIL) 25 MG tablet Take 1 tablet (25 mg total) by mouth daily.  30 tablet  0  . DISCONTD: metoprolol succinate (TOPROL-XL) 25 MG 24 hr tablet Take 1 tablet (25 mg total) by mouth daily.  30 tablet  12    BP 120/78  Pulse 85  Temp 98.3 F (36.8 C) (Oral)  Resp 16  Ht 5' 4.5" (1.638 m)  Wt 263 lb 0.6 oz (119.314 kg)  BMI 44.45 kg/m2  SpO2 99%  LMP 04/03/2012       Objective:   Physical Exam  Constitutional: She appears well-developed and well-nourished. No distress.  Cardiovascular: Normal rate and regular rhythm.   No murmur heard. Pulmonary/Chest: Effort normal and breath sounds normal.  Skin:       Some skin irritation noted at EKG lead placement sites along with some adhesive residue.   Psychiatric: She has a normal mood and affect. Her behavior is normal. Judgment and thought content normal.          Assessment & Plan:

## 2012-04-08 NOTE — Telephone Encounter (Signed)
Below instructions were discussed with pt by Provider at office visit today.

## 2012-04-08 NOTE — Assessment & Plan Note (Signed)
She has consult with pulmonary scheduled and I advised her to keep this apt.

## 2012-04-08 NOTE — Assessment & Plan Note (Signed)
Rate looks good today on toprol xl 50mg .  Continue same.

## 2012-04-08 NOTE — Patient Instructions (Addendum)
Please follow up in 3 months. Complete lab work prior to leaving.  

## 2012-04-10 ENCOUNTER — Telehealth: Payer: Self-pay | Admitting: Family

## 2012-04-10 DIAGNOSIS — E876 Hypokalemia: Secondary | ICD-10-CM

## 2012-04-10 MED ORDER — POTASSIUM CHLORIDE CRYS ER 20 MEQ PO TBCR: 20.0000 meq | EXTENDED_RELEASE_TABLET | Freq: Every day | ORAL | Status: DC

## 2012-04-10 NOTE — Telephone Encounter (Signed)
LMOM with contact name & number to inform pt of results & further physician instructions on repeat lab in [1] week [entered] and new Rx to pharmacy/SLS

## 2012-04-10 NOTE — Telephone Encounter (Signed)
Please call pt and let her know that her potassium is a little low.  I would like her to add a potassium supplement once daily and repeat bmet in 1 week. Dx Hypokalemia.

## 2012-04-16 NOTE — Addendum Note (Signed)
Addended by: Reine Just on: 04/16/2012 04:34 PM   Modules accepted: Orders

## 2012-04-30 ENCOUNTER — Ambulatory Visit: Payer: Managed Care, Other (non HMO) | Admitting: Cardiology

## 2012-05-06 ENCOUNTER — Institutional Professional Consult (permissible substitution): Payer: Managed Care, Other (non HMO) | Admitting: Pulmonary Disease

## 2012-05-06 ENCOUNTER — Ambulatory Visit (INDEPENDENT_AMBULATORY_CARE_PROVIDER_SITE_OTHER): Payer: Managed Care, Other (non HMO) | Admitting: Pulmonary Disease

## 2012-05-06 ENCOUNTER — Encounter: Payer: Self-pay | Admitting: Pulmonary Disease

## 2012-05-06 VITALS — BP 120/88 | HR 77 | Temp 98.1°F | Ht 64.5 in | Wt 265.0 lb

## 2012-05-06 DIAGNOSIS — G4733 Obstructive sleep apnea (adult) (pediatric): Secondary | ICD-10-CM

## 2012-05-06 NOTE — Assessment & Plan Note (Signed)
We will get you started with a cpap machine with nasal mask autoCPAP 5-12 cm, nasal mask of choice, humidity Download in 4 weeks  Turn in the card in 4 weeks before your next appt  Given excessive daytime somnolence, narrow pharyngeal exam, witnessed apneas & loud snoring, obstructive sleep apnea is very likely & an overnight polysomnogram will be scheduled as a split study. The pathophysiology of obstructive sleep apnea , it's cardiovascular consequences & modes of treatment including CPAP were discused with the patient in detail & they evidenced understanding. Weight loss encouraged, compliance with goal of at least 4-6 hrs every night is the expectation. Advised against medications with sedative side effects Cautioned against driving when sleepy - understanding that sleepiness will vary on a day to day basis

## 2012-05-06 NOTE — Progress Notes (Signed)
  Subjective:    Patient ID: Tina Figueroa, female    DOB: 25-Jul-1980, 31 y.o.   MRN: [id number]  HPI    Review of Systems  Constitutional: Negative for fever and unexpected weight change.  HENT: Positive for congestion, sore throat, postnasal drip and sinus pressure. Negative for ear pain, nosebleeds, rhinorrhea, sneezing, trouble swallowing and dental problem.   Eyes: Negative for redness and itching.  Respiratory: Positive for cough. Negative for chest tightness, shortness of breath and wheezing.   Cardiovascular: Positive for palpitations. Negative for leg swelling.  Gastrointestinal: Negative for nausea and vomiting.  Genitourinary: Negative for dysuria.  Musculoskeletal: Negative for joint swelling.  Skin: Negative for rash.  Neurological: Negative for headaches.  Hematological: Does not bruise/bleed easily.  Psychiatric/Behavioral: Negative for dysphoric mood. The patient is not nervous/anxious.        Objective:   Physical Exam        Assessment & Plan:

## 2012-05-06 NOTE — Progress Notes (Signed)
  Subjective:    Patient ID: Tina Figueroa, female    DOB: [birth year], 31 y.o.   MRN: [id number]  HPI 31 y.o obese woman, hypertensive referred for evaluation of obstructive sleep apnea  Family members have noted loud snoring, witnessed apneas & she has woken herself up with coughing & gasping episodes. 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 ESS 5 Bedtime is 1am, sleep latency variable & can be as long as 1 h, she sleeps on her stomach or side with 3-4 pillows, 3-4 awakenings, spontaneous, oob at 8am feeling tired, no dryness or headaches Wt has fluctuated from 230-278 lbs, 265 today No excess c afffeinated beverages Home study showed AHI 34/h with nadir satn of 85% There is no history suggestive of cataplexy, sleep paralysis or parasomnias   Past Medical History  Diagnosis Date  . PCOS (polycystic ovarian syndrome)   . History of chicken pox   . Elevated blood pressure reading without diagnosis of hypertension   . Palpitations   . Mild hyperlipidemia 03/18/2012  . OSA (obstructive sleep apnea) 03/25/2012    Severe OSA per sleep study 10/13.    Past Surgical History  Procedure Date  . Tonsillectomy   . Wisdom tooth extraction   . Dilation and curettage of uterus   . Adenoidectomy 1989   Allergies  Allergen Reactions  . Flagyl (Metronidazole Hcl)     Chalky and coated tongue  . Other     History   Social History  . Marital Status: Single    Spouse Name: N/A    Number of Children: 0  . Years of Education: N/A   Occupational History  .     Social History Main Topics  . Smoking status: Never Smoker   . Smokeless tobacco: Never Used  . Alcohol Use: Yes     Comment: Rarely  . Drug Use: Not on file  . Sexually Active: Not on file   Other Topics Concern  . 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 Also denies presyncope, palpitations, heartburn, abdominal pain, nausea, vomiting, diarrhea or change in bowel or urinary habits, dysuria,hematuria, rash, arthralgias, visual complaints, headache, numbness weakness or ataxia.     Objective:   Physical Exam  Gen. Pleasant, obese, in no distress, normal affect ENT - no lesions, no post nasal drip, class 2-3 airway Neck: No JVD, no thyromegaly, no carotid bruits Lungs: no use of accessory muscles, no dullness to percussion, decreased without rales or rhonchi  Cardiovascular: Rhythm regular, heart sounds  normal, no murmurs or gallops, no peripheral edema Abdomen: soft and non-tender, no hepatosplenomegaly, BS normal. Musculoskeletal: No deformities, no cyanosis or clubbing Neuro:  alert, non focal, no tremors       Assessment & Plan:

## 2012-05-06 NOTE — Patient Instructions (Signed)
We will get you started with a cpap machine with nasal mask Turn in the card in 4 weeks before your next appt

## 2012-05-19 ENCOUNTER — Encounter: Payer: Self-pay | Admitting: Family

## 2012-05-19 NOTE — Telephone Encounter (Signed)
Nicki Guadalajara- could you pls call her pharmacy and cancel rx for metoprolol 25mg . Thanks.

## 2012-05-19 NOTE — Telephone Encounter (Signed)
Message has been left on pharmacy voicemail that pt should currently be taking Metoprolol 50mg  and to call if any questions. Please advise re: additional question.

## 2012-06-05 LAB — HM PAP SMEAR: HM Pap smear: NORMAL

## 2012-06-25 ENCOUNTER — Encounter: Payer: Managed Care, Other (non HMO) | Admitting: Cardiology

## 2012-06-25 NOTE — Progress Notes (Signed)
HPI: 32 year old female for fu of palpitations. Patient states that in 2004 she was seen at [facility] with what sounds to be potentially SVT. I do not have those records available. TSH in September of 2013 was normal. Echocardiogram in October 2013 showed normal LV function, grade 1 diastolic dysfunction. CardioNet in October of 2013 showed sinus rhythm with question atrial flutter. Patient seen by Dr. Graciela Husbands and SVT felt secondary to either atrial tachycardia or sinus tachycardia. He felt the latter was more likely. He increased her beta blocker. Since then,   Current Outpatient Prescriptions  Medication Sig Dispense Refill  . aspirin EC 81 MG tablet Take 1 tablet (81 mg total) by mouth daily.  90 tablet  3  . Eflornithine HCl 13.9 % cream Apply a thin layer to affected areas on face/neck twice daily, at least 8 hrs apart. Rub in thoroughly. Do not wash treated area for at least 4 hrs.  30 g  2  . fluticasone (FLONASE) 50 MCG/ACT nasal spray Place 2 sprays into the nose as needed.      . hydrochlorothiazide (HYDRODIURIL) 25 MG tablet Take 1 tablet (25 mg total) by mouth daily.  30 tablet  5  . metoprolol succinate (TOPROL-XL) 50 MG 24 hr tablet Take 1 tablet (50 mg total) by mouth daily. Take with or immediately following a meal.  30 tablet  2  . Multiple Vitamin (MULTIVITAMIN) tablet Take 1 tablet by mouth daily.      . Norethin Ace-Eth Estrad-FE (MINASTRIN 24 FE) 1-20 MG-MCG(24) CHEW Chew 1 tablet by mouth daily.      . potassium chloride SA (K-DUR,KLOR-CON) 20 MEQ tablet Take 1 tablet (20 mEq total) by mouth daily.  30 tablet  2     Past Medical History  Diagnosis Date  . PCOS (polycystic ovarian syndrome)   . History of chicken pox   . Elevated blood pressure reading without diagnosis of hypertension   . Palpitations   . Mild hyperlipidemia 03/18/2012  . OSA (obstructive sleep apnea) 03/25/2012    Severe OSA per sleep study 10/13.   Marland Kitchen Hypertension     Past Surgical History    Procedure Date  . Tonsillectomy   . Wisdom tooth extraction   . Dilation and curettage of uterus   . Adenoidectomy 1989    History   Social History  . Marital Status: Single    Spouse Name: N/A    Number of Children: 0  . Years of Education: N/A   Occupational History  . Government social research officer    Social History Main Topics  . Smoking status: Never Smoker   . Smokeless tobacco: Never Used  . Alcohol Use: Yes     Comment: Rarely  . Drug Use: No  . Sexually Active: Not on file   Other Topics Concern  . 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 Remaining systems are negative.  Physical Exam: Well-developed well-nourished in no acute distress.  Skin is warm and dry.  HEENT is normal.  Neck is supple.  Chest is clear to auscultation with normal expansion.  Cardiovascular exam is regular rate and rhythm.  Abdominal exam nontender or distended. No masses palpated.  Extremities show no edema. neuro grossly intact  ECG     This encounter was created in error - please disregard.

## 2012-07-07 ENCOUNTER — Encounter: Payer: Self-pay | Admitting: Family

## 2012-07-07 ENCOUNTER — Ambulatory Visit (INDEPENDENT_AMBULATORY_CARE_PROVIDER_SITE_OTHER): Payer: Managed Care, Other (non HMO) | Admitting: Family

## 2012-07-07 VITALS — BP 120/82 | HR 89 | Temp 98.0°F | Resp 16 | Ht 64.5 in | Wt 268.0 lb

## 2012-07-07 DIAGNOSIS — E876 Hypokalemia: Secondary | ICD-10-CM

## 2012-07-07 DIAGNOSIS — I10 Essential (primary) hypertension: Secondary | ICD-10-CM

## 2012-07-07 DIAGNOSIS — G4733 Obstructive sleep apnea (adult) (pediatric): Secondary | ICD-10-CM

## 2012-07-07 NOTE — Patient Instructions (Addendum)
Please return to complete your blood work tomorrow morning. Follow up in 3 months.

## 2012-07-07 NOTE — Assessment & Plan Note (Signed)
Feeling better since starting CPAP, continue same.

## 2012-07-07 NOTE — Progress Notes (Signed)
Subjective:    Patient ID: Tina Figueroa, female    DOB: 08/29/1980, 32 y.o.   MRN: [id number]  HPI  Tina Figueroa is a 32 yr old female here today for follow up of her HTN. She is tolerating hctz and toprol without any difficulty. She denies CP, SOB, LE edema.  OSA- started CPAP.  Feels better during the day. Reports that she is trying to get used to it.    Review of Systems See HPI  Past Medical History  Diagnosis Date  . PCOS (polycystic ovarian syndrome)   . History of chicken pox   . Elevated blood pressure reading without diagnosis of hypertension   . Palpitations   . Mild hyperlipidemia 03/18/2012  . OSA (obstructive sleep apnea) 03/25/2012    Severe OSA per sleep study 10/13.   Marland Kitchen Hypertension     History   Social History  . Marital Status: Single    Spouse Name: N/A    Number of Children: 0  . Years of Education: N/A   Occupational History  . Government social research officer    Social History Main Topics  . Smoking status: Never Smoker   . Smokeless tobacco: Never Used  . Alcohol Use: Yes     Comment: Rarely  . Drug Use: No  . Sexually Active: Not on file   Other Topics Concern  . 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 Tonsillectomy   . Wisdom tooth extraction   . Dilation and curettage of uterus   . Adenoidectomy 1989    Family History  Problem Relation Age of Onset  . Heart disease Mother     CHF  . Hypertension Mother   . Depression Mother   . Arthritis Father   . Diabetes Maternal Aunt   . Arthritis Maternal Uncle   . Diabetes Paternal Uncle   . Arthritis Maternal Grandmother   . Cancer Maternal Grandmother     lung  . Arthritis Paternal Grandmother   . Cancer Paternal Grandmother     breast  . Stroke Paternal Grandmother   . Diabetes  Paternal Grandmother   . Cancer Paternal Grandfather     colon and prostate  . Cancer Other     aunt  . Asthma Mother   . Polycystic ovary syndrome Mother     Allergies  Allergen Reactions  . Flagyl (Metronidazole Hcl)     Chalky and coated tongue  . Other     Current Outpatient Prescriptions on File Prior to Visit  Medication Sig Dispense Refill  . aspirin EC 81 MG tablet Take 1 tablet (81 mg total) by mouth daily.  90 tablet  3  . Eflornithine HCl 13.9 % cream Apply a thin layer to affected areas on face/neck twice daily, at least 8 hrs apart. Rub in thoroughly. Do not wash treated area for at least 4 hrs.  30 g  2  . fluticasone (FLONASE) 50 MCG/ACT nasal spray Place 2 sprays into the nose as needed.      . hydrochlorothiazide (HYDRODIURIL) 25 MG tablet Take 1 tablet (25 mg total) by mouth daily.  30 tablet  5  . metoprolol succinate (TOPROL-XL) 50 MG 24 hr tablet Take 1 tablet (50 mg total) by mouth daily. Take with or immediately following  a meal.  30 tablet  2  . Multiple Vitamin (MULTIVITAMIN) tablet Take 1 tablet by mouth daily.      . Norethin Ace-Eth Estrad-FE (MINASTRIN 24 FE) 1-20 MG-MCG(24) CHEW Chew 1 tablet by mouth daily.      . potassium chloride SA (K-DUR,KLOR-CON) 20 MEQ tablet Take 1 tablet (20 mEq total) by mouth daily.  30 tablet  2    BP 120/82  Pulse 89  Temp 98 F (36.7 C) (Oral)  Resp 16  Ht 5' 4.5" (1.638 m)  Wt 268 lb (121.564 kg)  BMI 45.29 kg/m2  SpO2 99%  LMP 07/04/2011       Objective:   Physical Exam  Constitutional: She appears well-developed and well-nourished. No distress.  Cardiovascular: Normal rate and regular rhythm.   No murmur heard. Pulmonary/Chest: Effort normal and breath sounds normal. No respiratory distress. She has no wheezes. She has no rales. She exhibits no tenderness.  Musculoskeletal: She exhibits no edema.  Psychiatric: She has a normal mood and affect. Her behavior is normal. Judgment and thought content normal.            Assessment & Plan:

## 2012-07-07 NOTE — Assessment & Plan Note (Addendum)
BP is stable on current meds.  Obtain bmet. 

## 2012-07-15 ENCOUNTER — Ambulatory Visit: Payer: Managed Care, Other (non HMO) | Admitting: Pulmonary Disease

## 2012-07-19 ENCOUNTER — Other Ambulatory Visit: Payer: Self-pay | Admitting: Family

## 2012-08-11 ENCOUNTER — Telehealth: Payer: Self-pay | Admitting: Pulmonary Disease

## 2012-08-11 NOTE — Telephone Encounter (Signed)
Nothing further needed and I will advise Dr. Vassie Loll. Carron Curie, CMA

## 2012-08-11 NOTE — Telephone Encounter (Signed)
Jonette Eva @ [facility] returned Jennifer's call & states that pt never mailed them the download card to obtain the download.  A message was left for pt regarding the download.  Pt never responded to their calls requesting this.  Order was Cx since pt never responded & this info was faxed to [facility] on 07/02/12.  Antionette Fairy

## 2012-08-11 NOTE — Telephone Encounter (Signed)
I LMTCB with Kasey at [facility]. Pt was supposed to have download done before she f/u with [facility] tomorrow in [place]. I need download to be faxed to [place]. Carron Curie, CMA

## 2012-08-12 ENCOUNTER — Ambulatory Visit: Payer: Managed Care, Other (non HMO) | Admitting: Pulmonary Disease

## 2012-08-18 ENCOUNTER — Telehealth: Payer: Self-pay | Admitting: Family

## 2012-08-18 MED ORDER — METOPROLOL SUCCINATE ER 50 MG PO TB24: ORAL_TABLET | ORAL | Status: DC

## 2012-08-18 NOTE — Telephone Encounter (Signed)
Refill- metoprolol er succinate 50mg  tabs. Take one tablet by mouth daily. Take with or immediately following a meal. Qty 30 last fill 2.3.14

## 2012-08-18 NOTE — Telephone Encounter (Signed)
Informed patient of medication refill and she did schedule an appointment for 09/29/12

## 2012-08-18 NOTE — Telephone Encounter (Signed)
Pt is due for a follow up in April. Please call pt to arrange appt. Refill sent.

## 2012-09-15 ENCOUNTER — Telehealth: Payer: Self-pay | Admitting: Family

## 2012-09-15 MED ORDER — METOPROLOL SUCCINATE ER 50 MG PO TB24: ORAL_TABLET | ORAL | Status: DC

## 2012-09-15 NOTE — Telephone Encounter (Signed)
Rx request to pharmacy/[facility]  

## 2012-09-15 NOTE — Telephone Encounter (Signed)
Patient has appt on 4-14  Refill metoprolol er succinate 50 mg tab take 1 tablet by mouth every day take with or immediately following a meal  Qty 30

## 2012-09-29 ENCOUNTER — Ambulatory Visit: Payer: Managed Care, Other (non HMO) | Admitting: Family

## 2012-09-29 DIAGNOSIS — Z0289 Encounter for other administrative examinations: Secondary | ICD-10-CM

## 2013-03-09 ENCOUNTER — Encounter: Payer: Self-pay | Admitting: Family

## 2013-03-09 ENCOUNTER — Ambulatory Visit (INDEPENDENT_AMBULATORY_CARE_PROVIDER_SITE_OTHER): Payer: BC Managed Care – PPO | Admitting: Family

## 2013-03-09 VITALS — BP 146/80 | HR 82 | Temp 98.0°F | Ht 64.25 in | Wt 272.8 lb

## 2013-03-09 DIAGNOSIS — Z0000 Encounter for general adult medical examination without abnormal findings: Secondary | ICD-10-CM

## 2013-03-09 DIAGNOSIS — Z3009 Encounter for other general counseling and advice on contraception: Secondary | ICD-10-CM

## 2013-03-09 DIAGNOSIS — E785 Hyperlipidemia, unspecified: Secondary | ICD-10-CM

## 2013-03-09 DIAGNOSIS — I10 Essential (primary) hypertension: Secondary | ICD-10-CM

## 2013-03-09 DIAGNOSIS — Z23 Encounter for immunization: Secondary | ICD-10-CM

## 2013-03-09 LAB — BASIC METABOLIC PANEL WITH GFR
CO2: 25 mEq/L (ref 19–32)
Creat: 0.7 mg/dL (ref 0.50–1.10)
GFR, Est African American: >89 mL/min
GFR, Est Non African American: >89 mL/min
Potassium: 3.7 mEq/L (ref 3.5–5.3)
Sodium: 136 mEq/L (ref 135–145)

## 2013-03-09 LAB — HEPATIC FUNCTION PANEL
ALT: 13 U/L (ref 0–35)
Albumin: 4.1 g/dL (ref 3.5–5.2)
Indirect Bilirubin: 0.6 mg/dL (ref 0.0–0.9)
Total Protein: 7.5 g/dL (ref 6.0–8.3)

## 2013-03-09 LAB — CBC WITH DIFFERENTIAL/PLATELET
Basophils Absolute: 0 10*3/uL (ref 0.0–0.1)
Eosinophils Absolute: 0.1 10*3/uL (ref 0.0–0.7)
Eosinophils Relative: 1 % (ref 0–5)
Hemoglobin: 14.3 g/dL (ref 12.0–15.0)
Lymphs Abs: 2.3 10*3/uL (ref 0.7–4.0)
Monocytes Relative: 5 % (ref 3–12)
Neutro Abs: 6.8 10*3/uL (ref 1.7–7.7)
Neutrophils Relative %: 71 % (ref 43–77)
RBC: 4.58 MIL/uL (ref 3.87–5.11)

## 2013-03-09 LAB — LIPID PANEL
HDL: 53 mg/dL (ref >39)
LDL Cholesterol: 132 mg/dL — ABNORMAL HIGH (ref 0–99)
Triglycerides: 42 mg/dL (ref <150)

## 2013-03-09 LAB — TSH: TSH: 1.402 u[IU]/mL (ref 0.350–4.500)

## 2013-03-09 MED ORDER — HYDROCHLOROTHIAZIDE 25 MG PO TABS: 25.0000 mg | ORAL_TABLET | Freq: Every day | ORAL | Status: DC

## 2013-03-09 MED ORDER — NORETHINDRONE ACET-ETHINYL EST 1-20 MG-MCG PO TABS: 1.0000 | ORAL_TABLET | Freq: Every day | ORAL | Status: DC

## 2013-03-09 MED ORDER — POTASSIUM CHLORIDE CRYS ER 20 MEQ PO TBCR: 20.0000 meq | EXTENDED_RELEASE_TABLET | Freq: Every day | ORAL | Status: DC

## 2013-03-09 MED ORDER — METOPROLOL SUCCINATE ER 50 MG PO TB24: ORAL_TABLET | ORAL | Status: DC

## 2013-03-09 NOTE — Progress Notes (Signed)
Subjective:    Patient ID: Tina Figueroa, female    DOB: 1980/12/18, 32 y.o.   MRN: [id number]  HPI  Tina Figueroa is a 32 year old female who presents today with for an annual physical exam and follow up to a few medical problems.  1)Diet- Reports unhealthy diet lately, stopped weight watchers in April 2014, plans to start up again.  2)Exercise- Denies recent exercise, reports she used to walk and work out at [employer]  Plans to start up again.  3)HTN- Patient reports she has stopped taking her medications for several months due to lack of insurance and inability to afford them. Patient hypertensive in office today 146/80. Denies chest pain, SOB, palpitations, dizziness.    4)Hyperlipidemia- Patient denies recent exercise and diet management.  Lipid panel elevated at last visit, patient to get fasting labs today.     Review of Systems  Constitutional: Negative for activity change, fatigue and unexpected weight change.  HENT: Negative for ear pain, sore throat, rhinorrhea and sinus pressure.   Respiratory: Negative for shortness of breath.   Cardiovascular: Negative for chest pain.  Gastrointestinal: Negative for abdominal pain.  Genitourinary: Positive for menstrual problem. Negative for flank pain.       Irregular, LMP was April 2014. Patient has not been taking birth control due to inability to afford.  Neurological: Negative for dizziness, light-headedness and headaches.  Psychiatric/Behavioral: Negative for behavioral problems. The patient is not nervous/anxious.    Past Medical History  Diagnosis Date  . PCOS (polycystic ovarian syndrome)   . History of chicken pox   . Elevated blood pressure reading without diagnosis of hypertension   . Palpitations   . Mild hyperlipidemia 03/18/2012  . OSA (obstructive sleep apnea) 03/25/2012    Severe OSA per sleep study 10/13.   Marland Kitchen Hypertension     History   Social History  . Marital Status: Single    Spouse Name: N/A    Number of  Children: 0  . Years of Education: N/A   Occupational History  . Government social research officer    Social History Main Topics  . Smoking status: Never Smoker   . Smokeless tobacco: Never Used  . Alcohol Use: Yes     Comment: Rarely  . Drug Use: No  . Sexual Activity: Not on file   Other Topics Concern  . 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 Tonsillectomy    . Wisdom tooth extraction    . Dilation and curettage of uterus    . Adenoidectomy  1989    Family History  Problem Relation Age of Onset  . Heart disease Mother     CHF  . Hypertension Mother   . Depression Mother   . Arthritis Father   . Diabetes Maternal Aunt   . Arthritis Maternal Uncle   . Diabetes Paternal Uncle   . Arthritis Maternal Grandmother   . Cancer Maternal Grandmother     lung  . Arthritis Paternal Grandmother   . Cancer Paternal Grandmother     breast  . Stroke Paternal Grandmother   . Diabetes Paternal Grandmother   .  Cancer Paternal Grandfather     colon and prostate  . Cancer Other     aunt  . Asthma Mother   . Polycystic ovary syndrome Mother     Allergies  Allergen Reactions  . Flagyl [Metronidazole Hcl]     Chalky and coated tongue  . Other     Current Outpatient Prescriptions on File Prior to Visit  Medication Sig Dispense Refill  . Multiple Vitamin (MULTIVITAMIN) tablet Take 1 tablet by mouth daily.      . Eflornithine HCl 13.9 % cream Apply a thin layer to affected areas on face/neck twice daily, at least 8 hrs apart. Rub in thoroughly. Do not wash treated area for at least 4 hrs.  30 g  2  . fluticasone (FLONASE) 50 MCG/ACT nasal spray Place 2 sprays into the nose as needed.      . Norethin Ace-Eth Estrad-FE (MINASTRIN  24 FE) 1-20 MG-MCG(24) CHEW Chew 1 tablet by mouth daily.       No current facility-administered medications on file prior to visit.    BP 146/80  Pulse 82  Temp(Src) 98 F (36.7 C) (Oral)  Ht 5' 4.25" (1.632 m)  Wt 272 lb 12 oz (123.719 kg)  BMI 46.45 kg/m2  SpO2 98%       Objective:   Physical Exam  Constitutional: She is oriented to person, place, and time. She appears well-nourished.  HENT:  Head: Normocephalic.  Right Ear: External ear normal.  Left Ear: External ear normal.  Nose: Nose normal.  Mouth/Throat: Oropharynx is clear and moist.  Eyes: Pupils are equal, round, and reactive to light.  Neck: Neck supple. No thyromegaly present.  Cardiovascular: Normal rate, regular rhythm, normal heart sounds and intact distal pulses.   Pulmonary/Chest: Effort normal and breath sounds normal.  Abdominal: Soft. Bowel sounds are normal. There is no tenderness.  Musculoskeletal: She exhibits no edema.  Lymphadenopathy:    She has no cervical adenopathy.  Neurological: She is alert and oriented to person, place, and time. She has normal reflexes.  Skin: Skin is warm and dry. No rash noted.  hirsiutism noted on face/neck  Psychiatric: She has a normal mood and affect. Her behavior is normal.          Assessment & Plan:  I have personally seen and examined this pt.  I agree with assessment and plan as noted by Vernona Rieger NP student.

## 2013-03-09 NOTE — Assessment & Plan Note (Addendum)
Denies headaches, dizziness, chest pain. Patient stopped taking HCTZ and Toprol XL several months ago due to inability to afford. Patient now has insurance.  Resume toprol XL, HCTZ, and potassium. Refills sent to pharmacy, patient to follow up in 1 month for recheck.

## 2013-03-09 NOTE — Patient Instructions (Addendum)
Please complete your lab work prior to leaving. Restart medications. Follow up in 3 months.

## 2013-03-09 NOTE — Assessment & Plan Note (Addendum)
Cholesterol elevated from last visit. Patient plans to resume diet and exercise regimen through weight watchers. Patient counseled on importance of weight loss.  Fasting labs obtained today in office.  Plan to follow up if lipid panel still elevated.

## 2013-03-10 ENCOUNTER — Encounter: Payer: Self-pay | Admitting: Family

## 2013-03-10 DIAGNOSIS — Z0000 Encounter for general adult medical examination without abnormal findings: Secondary | ICD-10-CM | POA: Insufficient documentation

## 2013-03-10 LAB — URINALYSIS, MICROSCOPIC ONLY
Bacteria, UA: NONE SEEN
Casts: NONE SEEN
Crystals: NONE SEEN

## 2013-03-10 LAB — PREGNANCY, URINE: Preg Test, Ur: NEGATIVE

## 2013-03-10 LAB — URINALYSIS, ROUTINE W REFLEX MICROSCOPIC
Bilirubin Urine: NEGATIVE
Glucose, UA: NEGATIVE mg/dL
Ketones, ur: NEGATIVE mg/dL
Nitrite: NEGATIVE
Specific Gravity, Urine: 1.019 (ref 1.005–1.030)
Urobilinogen, UA: 1 mg/dL (ref 0.0–1.0)
pH: 5.5 (ref 5.0–8.0)

## 2013-03-10 NOTE — Assessment & Plan Note (Signed)
Obtain fasting labs.  Healthy diet and exercise discussed.  Flu shot given.

## 2013-03-10 NOTE — Progress Notes (Deleted)
  Subjective:    Patient ID: Tina Figueroa, female    DOB: 1980/07/04, 31 y.o.   MRN: [id number]  HPI    Review of Systems     Objective:   Physical Exam        Assessment & Plan:

## 2013-06-09 ENCOUNTER — Ambulatory Visit: Payer: BC Managed Care – PPO | Admitting: Family

## 2013-06-22 ENCOUNTER — Ambulatory Visit: Payer: BC Managed Care – PPO | Admitting: Family

## 2013-06-22 DIAGNOSIS — Z0289 Encounter for other administrative examinations: Secondary | ICD-10-CM

## 2013-09-09 ENCOUNTER — Ambulatory Visit (INDEPENDENT_AMBULATORY_CARE_PROVIDER_SITE_OTHER): Payer: BC Managed Care – PPO | Admitting: Family

## 2013-09-09 ENCOUNTER — Encounter: Payer: Self-pay | Admitting: Family

## 2013-09-09 VITALS — BP 120/90 | HR 88 | Temp 98.6°F | Ht 64.25 in | Wt 267.0 lb

## 2013-09-09 DIAGNOSIS — I10 Essential (primary) hypertension: Secondary | ICD-10-CM

## 2013-09-09 DIAGNOSIS — F32A Depression, unspecified: Secondary | ICD-10-CM

## 2013-09-09 DIAGNOSIS — F3289 Other specified depressive episodes: Secondary | ICD-10-CM

## 2013-09-09 DIAGNOSIS — F329 Major depressive disorder, single episode, unspecified: Secondary | ICD-10-CM

## 2013-09-09 LAB — BASIC METABOLIC PANEL
BUN: 7 mg/dL (ref 6–23)
CALCIUM: 9 mg/dL (ref 8.4–10.5)
CO2: 29 mEq/L (ref 19–32)
Chloride: 100 mEq/L (ref 96–112)
Creat: 0.81 mg/dL (ref 0.50–1.10)
Glucose, Bld: 78 mg/dL (ref 70–99)
Potassium: 3.2 mEq/L — ABNORMAL LOW (ref 3.5–5.3)
SODIUM: 139 meq/L (ref 135–145)

## 2013-09-09 MED ORDER — BETAMETHASONE DIPROPIONATE 0.05 % EX CREA: TOPICAL_CREAM | Freq: Two times a day (BID) | CUTANEOUS | Status: DC

## 2013-09-09 MED ORDER — ESCITALOPRAM OXALATE 10 MG PO TABS
ORAL_TABLET | ORAL | Status: DC
Start: 2013-09-09 — End: 2013-10-09

## 2013-09-09 NOTE — Progress Notes (Signed)
Pre visit review using our clinic review tool, if applicable. No additional management support is needed unless otherwise documented below in the visit note. 

## 2013-09-09 NOTE — Progress Notes (Signed)
Subjective:    Patient ID: Tina Figueroa, female    DOB: 01/13/1981, 33 y.o.   MRN: [id number]  HPI  Tina Figueroa is a 34 yr old female who presents today for follow up.  Mom died 29 months ago. Recently learned that her Dad was evicted from his appartment. Now he is living with a woman who she has never met.  Not sleeping well, crying frequently.  Denies SI/HI.  Able to get up and out of bed. Has  financial stressors. boyfriend is emotionally supportive.  She denies panic attacks.     Review of Systems See HPi  Past Medical History  Diagnosis Date  . PCOS (polycystic ovarian syndrome)   . History of chicken pox   . Elevated blood pressure reading without diagnosis of hypertension   . Palpitations   . Mild hyperlipidemia 03/18/2012  . OSA (obstructive sleep apnea) 03/25/2012    Severe OSA per sleep study 10/13.   Marland Kitchen Hypertension     History   Social History  . Marital Status: Single    Spouse Name: N/A    Number of Children: 0  . Years of Education: N/A   Occupational History  . Surveyor, minerals    Social History Main Topics  . Smoking status: Never Smoker   . Smokeless tobacco: Never Used  . Alcohol Use: Yes     Comment: Rarely  . Drug Use: No  . Sexual Activity: Not on file   Other Topics Concern  . 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 Tonsillectomy    . Wisdom tooth extraction    . Dilation and curettage of uterus    . Adenoidectomy  1989    Family History  Problem Relation Age of Onset  . Heart disease Mother     CHF  . Hypertension Mother   . Depression Mother   . Arthritis Father   . Diabetes Maternal Aunt   . Arthritis Maternal Uncle   . Diabetes Paternal Uncle   . Arthritis  Maternal Grandmother   . Cancer Maternal Grandmother     lung  . Arthritis Paternal Grandmother   . Cancer Paternal Grandmother     breast  . Stroke Paternal Grandmother   . Diabetes Paternal Grandmother   . Cancer Paternal Grandfather     colon and prostate  . Cancer Other     aunt  . Asthma Mother   . Polycystic ovary syndrome Mother     Allergies  Allergen Reactions  . Flagyl [Metronidazole Hcl]     Chalky and coated tongue  . Other     Current Outpatient Prescriptions on File Prior to Visit  Medication Sig Dispense Refill  . Eflornithine HCl 13.9 % cream Apply a thin layer to affected areas on face/neck twice daily, at least 8 hrs apart. Rub in thoroughly. Do not wash treated area for at least 4 hrs.  30 g  2  . hydrochlorothiazide (HYDRODIURIL) 25 MG tablet Take 1 tablet (25 mg total) by mouth daily.  30 tablet  3  . metoprolol succinate (TOPROL-XL) 50 MG 24 hr  tablet TAKE 1 TABLET BY MOUTH EVERY DAY . TAKE WITH OR IMMEDIATELY FOLLOWING A MEAL .  30 tablet  3  . Multiple Vitamin (MULTIVITAMIN) tablet Take 1 tablet by mouth daily.      . Norethin Ace-Eth Estrad-FE (MINASTRIN 24 FE) 1-20 MG-MCG(24) CHEW Chew 1 tablet by mouth daily.      . norethindrone-ethinyl estradiol (MICROGESTIN,JUNEL,LOESTRIN) 1-20 MG-MCG tablet Take 1 tablet by mouth daily.  1 Package  5  . potassium chloride SA (K-DUR,KLOR-CON) 20 MEQ tablet Take 1 tablet (20 mEq total) by mouth daily.  30 tablet  3  . fluticasone (FLONASE) 50 MCG/ACT nasal spray Place 2 sprays into the nose as needed.       No current facility-administered medications on file prior to visit.    BP 120/90  Pulse 88  Temp(Src) 98.6 F (37 C) (Oral)  Ht 5' 4.25" (1.632 m)  Wt 267 lb (121.11 kg)  BMI 45.47 kg/m2  SpO2 99%  LMP 09/02/2013       Objective:   Physical Exam  Constitutional: She appears well-developed and well-nourished.  Psychiatric:  Tearful but pleasant          Assessment & Plan:

## 2013-09-09 NOTE — Patient Instructions (Signed)
Please complete lab work prior to leaving. Follow up in 1 month.  

## 2013-09-10 ENCOUNTER — Telehealth: Payer: Self-pay | Admitting: Family

## 2013-09-10 DIAGNOSIS — E876 Hypokalemia: Secondary | ICD-10-CM

## 2013-09-10 NOTE — Telephone Encounter (Signed)
Relevant patient education assigned to patient using Emmi. ° °

## 2013-09-10 NOTE — Telephone Encounter (Signed)
Potassium is low. Is she taking K Dur?  If not, needs to restart. If she is taking daily, then take 2 tabs today, then return to 1 tab daily. Repeat bmet in 1 week, dx hypokalemia.

## 2013-09-11 MED ORDER — POTASSIUM CHLORIDE 20 MEQ/15ML (10%) PO LIQD: 20.0000 meq | Freq: Every day | ORAL | Status: DC

## 2013-09-11 NOTE — Telephone Encounter (Signed)
Attempted to reach pt at [phone] per pt's request and pt has not arrived yet. Was asked to call back in 30 minutes.

## 2013-09-11 NOTE — Telephone Encounter (Signed)
Pt called back and was notified and voices understanding.

## 2013-09-11 NOTE — Telephone Encounter (Signed)
Notified pt. She states she has been out of the potassium. States that she had a hard time swallowing the Potassium pills and she wants to know if there is anything else she can take in place of it. Lab order entered.  Please advise.

## 2013-09-11 NOTE — Telephone Encounter (Signed)
Rx sent for potassium liquid in place of pills.

## 2013-09-13 DIAGNOSIS — F329 Major depressive disorder, single episode, unspecified: Secondary | ICD-10-CM | POA: Insufficient documentation

## 2013-09-13 DIAGNOSIS — F32A Depression, unspecified: Secondary | ICD-10-CM | POA: Insufficient documentation

## 2013-09-13 NOTE — Assessment & Plan Note (Signed)
Will initiate lexapro. I instructed pt to start 1/2 tablet once daily for 1 week and then increase to a full tablet once daily on week two as tolerated.  We discussed common side effects such as nausea, drowsiness and weight gain.  Also discussed rare but serious side effect of suicide ideation.  She is instructed to discontinue medication go directly to ED if this occurs.  Pt verbalizes understanding.  Plan follow up in 1 month to evaluate progress.  20 minutes spent with pt today.  >50% of this time was spent counseling pt on her depression.

## 2013-10-08 ENCOUNTER — Encounter: Payer: Self-pay | Admitting: Family

## 2013-10-09 MED ORDER — ESCITALOPRAM OXALATE 10 MG PO TABS: ORAL_TABLET | ORAL | Status: DC

## 2013-10-12 ENCOUNTER — Ambulatory Visit: Payer: BC Managed Care – PPO | Admitting: Family

## 2013-10-20 ENCOUNTER — Encounter: Payer: Self-pay | Admitting: Family

## 2013-10-20 MED ORDER — NORETHINDRONE ACET-ETHINYL EST 1-20 MG-MCG PO TABS: 1.0000 | ORAL_TABLET | Freq: Every day | ORAL | Status: DC

## 2013-11-13 ENCOUNTER — Other Ambulatory Visit: Payer: Self-pay | Admitting: Family

## 2013-11-13 NOTE — Telephone Encounter (Signed)
Did not want to leave message on work phone. Will send message through [facility]

## 2013-11-13 NOTE — Telephone Encounter (Signed)
Refill sent for Lexapro. Pt is due for follow up, please call pt to arrange.

## 2013-12-11 ENCOUNTER — Other Ambulatory Visit: Payer: Self-pay | Admitting: Family

## 2013-12-11 NOTE — Telephone Encounter (Signed)
Rx sent.  Please call pt to arrange appt within 1 week.

## 2013-12-11 NOTE — Telephone Encounter (Signed)
Can give 7 day supply- past due for OV.

## 2013-12-11 NOTE — Telephone Encounter (Signed)
Refill request for escitalopram Last filled by MD on- 11/13/2013 #30 x0 Last Appt: 09/09/2013 Next Appt: none Please advise refills?

## 2013-12-14 ENCOUNTER — Encounter: Payer: Self-pay | Admitting: Family

## 2013-12-14 ENCOUNTER — Telehealth: Payer: Self-pay | Admitting: Family

## 2013-12-14 ENCOUNTER — Ambulatory Visit (INDEPENDENT_AMBULATORY_CARE_PROVIDER_SITE_OTHER): Payer: BC Managed Care – PPO | Admitting: Family

## 2013-12-14 VITALS — BP 128/90 | HR 88 | Temp 98.3°F | Resp 16 | Ht 64.25 in | Wt 260.0 lb

## 2013-12-14 DIAGNOSIS — F329 Major depressive disorder, single episode, unspecified: Secondary | ICD-10-CM

## 2013-12-14 DIAGNOSIS — I10 Essential (primary) hypertension: Secondary | ICD-10-CM

## 2013-12-14 DIAGNOSIS — G4733 Obstructive sleep apnea (adult) (pediatric): Secondary | ICD-10-CM

## 2013-12-14 DIAGNOSIS — F3289 Other specified depressive episodes: Secondary | ICD-10-CM

## 2013-12-14 DIAGNOSIS — F32A Depression, unspecified: Secondary | ICD-10-CM

## 2013-12-14 MED ORDER — ESCITALOPRAM OXALATE 20 MG PO TABS: 20.0000 mg | ORAL_TABLET | Freq: Every day | ORAL | Status: DC

## 2013-12-14 NOTE — Progress Notes (Signed)
Pre visit review using our clinic review tool, if applicable. No additional management support is needed unless otherwise documented below in the visit note. 

## 2013-12-14 NOTE — Telephone Encounter (Signed)
See my chart message

## 2013-12-14 NOTE — Assessment & Plan Note (Signed)
Resume CPAP- see mychart message.

## 2013-12-14 NOTE — Assessment & Plan Note (Signed)
Improving.  Will increase lexapro from 10mg  to 20mg .

## 2013-12-14 NOTE — Patient Instructions (Signed)
Please return on Thursday for blood work. Increase lexapro to 20mg  once daily.  Follow up in 1 month.

## 2013-12-14 NOTE — Assessment & Plan Note (Signed)
BP stable on current meds. She will return for lab work so we can recheck electrolytes.

## 2013-12-14 NOTE — Progress Notes (Signed)
Subjective:    Patient ID: Tina MillinPamela J Figueroa, female    DOB: 01-09-1981, 33 y.o.   MRN: [id number]  HPI Tina Figueroa is a 33 yo female here for follow up of depression and hypertension.  HTN- She is maintained on HCTZ 25 mg and metoprolol succinate 50 mg. Taking 20 meq of potassium also for potassium of 3.2 on 09/09/2013.  BP Readings from Last 3 Encounters:  12/14/13 128/90  09/09/13 120/90  03/09/13 146/80   Depression- Currently taking escitalopram 10 mg daily which was started in April 2015. Feels like depression is stabilized. Had a very stressful week last week and felt anxious and emotional. She is interested in increasing her dose.    Review of Systems  Constitutional: Negative for fever, chills, diaphoresis and fatigue.  Respiratory: Negative for cough and shortness of breath.   Cardiovascular: Negative for chest pain, palpitations and leg swelling.   HTN -  BP Readings from Last 3 Encounters:  12/14/13 128/90  09/09/13 120/90  03/09/13 146/80   Wt Readings from Last 3 Encounters:  12/14/13 260 lb (117.935 kg)  09/09/13 267 lb (121.11 kg)  03/09/13 272 lb 12 oz (123.719 kg)   Compliant with anti hypertensive medication. No lightheadedness or other adverse medication effect described.  Seeing nutritionist who is helping her with diet in light of her comorbidities.  Not using CPAP. Exercise encompasses 30 minutes 2-3 times per week walking  Denies significant headaches, epistaxis, chest pain, palpitations, exertional dyspnea, claudication, paroxysmal nocturnal dyspnea, or edema.     Past Medical History  Diagnosis Date  . PCOS (polycystic ovarian syndrome)   . History of chicken pox   . Elevated blood pressure reading without diagnosis of hypertension   . Palpitations   . Mild hyperlipidemia 03/18/2012  . OSA (obstructive sleep apnea) 03/25/2012    Severe OSA per sleep study 10/13.   Marland Kitchen. Hypertension     History   Social History  . Marital Status: Single    Spouse  Name: N/A    Number of Children: 0  . Years of Education: N/A   Occupational History  . Government social research officerffice Assistant    Social History Main Topics  . Smoking status: Never Smoker   . Smokeless tobacco: Never Used  . Alcohol Use: Yes     Comment: Rarely  . Drug Use: No  . Sexual Activity: Not on file   Other Topics Concern  . 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 Tonsillectomy    . Wisdom tooth extraction    . Dilation and curettage of uterus    . Adenoidectomy  1989    Family History  Problem Relation Age of Onset  . Heart disease Mother     CHF  . Hypertension Mother   . Depression Mother   . Arthritis Father   . Diabetes Maternal Aunt   . Arthritis Maternal Uncle   . Diabetes Paternal Uncle   . Arthritis Maternal Grandmother   . Cancer Maternal Grandmother     lung  . Arthritis Paternal Grandmother   . Cancer Paternal Grandmother  breast  . Stroke Paternal Grandmother   . Diabetes Paternal Grandmother   . Cancer Paternal Grandfather     colon and prostate  . Cancer Other     aunt  . Asthma Mother   . Polycystic ovary syndrome Mother     Allergies  Allergen Reactions  . Flagyl [Metronidazole Hcl]     Chalky and coated tongue  . Other     Current Outpatient Prescriptions on File Prior to Visit  Medication Sig Dispense Refill  . betamethasone dipropionate (DIPROLENE) 0.05 % cream Apply topically 2 (two) times daily.  30 g  0  . Eflornithine HCl 13.9 % cream Apply a thin layer to affected areas on face/neck twice daily, at least 8 hrs apart. Rub in thoroughly. Do not wash treated area for at least 4 hrs.  30 g  2  . hydrochlorothiazide (HYDRODIURIL) 25 MG tablet Take 1 tablet (25 mg total) by  mouth daily.  30 tablet  3  . metoprolol succinate (TOPROL-XL) 50 MG 24 hr tablet TAKE 1 TABLET BY MOUTH EVERY DAY . TAKE WITH OR IMMEDIATELY FOLLOWING A MEAL .  30 tablet  3  . Multiple Vitamin (MULTIVITAMIN) tablet Take 1 tablet by mouth daily.      . Norethin Ace-Eth Estrad-FE (MINASTRIN 24 FE) 1-20 MG-MCG(24) CHEW Chew 1 tablet by mouth daily.      . norethindrone-ethinyl estradiol (MICROGESTIN,JUNEL,LOESTRIN) 1-20 MG-MCG tablet Take 1 tablet by mouth daily.  1 Package  5  . potassium chloride 20 MEQ/15ML (10%) solution Take 15 mLs (20 mEq total) by mouth daily.  500 mL  2  . fluticasone (FLONASE) 50 MCG/ACT nasal spray Place 2 sprays into the nose as needed.       No current facility-administered medications on file prior to visit.    BP 128/90  Pulse 88  Temp(Src) 98.3 F (36.8 C) (Oral)  Resp 16  Ht 5' 4.25" (1.632 m)  Wt 260 lb (117.935 kg)  BMI 44.28 kg/m2  SpO2 99%  LMP 12/07/2013    Objective:   Physical Exam  Constitutional: She is oriented to person, place, and time. She appears well-developed. No distress.  Obese.  HENT:  Head: Normocephalic.  Mouth/Throat: No oropharyngeal exudate.  Eyes: Pupils are equal, round, and reactive to light. No scleral icterus.  Cardiovascular: Normal rate, regular rhythm and normal heart sounds.  Exam reveals no gallop.   No murmur heard. Pulmonary/Chest: Effort normal and breath sounds normal. No respiratory distress. She has no wheezes. She has no rales.  Lymphadenopathy:    She has no cervical adenopathy.  Neurological: She is alert and oriented to person, place, and time.  Skin: Skin is warm and dry. She is not diaphoretic.  Psychiatric: She has a normal mood and affect. Her behavior is normal.          Assessment & Plan:  Patient seen by [facility] Whitmire NP-student.  I have personally seen and examined patient and agree with Ms. Whitmire's assessment and plan.

## 2013-12-15 ENCOUNTER — Other Ambulatory Visit: Payer: Self-pay | Admitting: Family

## 2013-12-16 ENCOUNTER — Encounter: Payer: Self-pay | Admitting: Family

## 2013-12-18 LAB — BASIC METABOLIC PANEL
BUN: 5 mg/dL — ABNORMAL LOW (ref 6–23)
CO2: 24 meq/L (ref 19–32)
Calcium: 8.6 mg/dL (ref 8.4–10.5)
Chloride: 105 mEq/L (ref 96–112)
Creat: 0.74 mg/dL (ref 0.50–1.10)
Glucose, Bld: 124 mg/dL — ABNORMAL HIGH (ref 70–99)
POTASSIUM: 3.7 meq/L (ref 3.5–5.3)
SODIUM: 134 meq/L — AB (ref 135–145)

## 2013-12-22 ENCOUNTER — Other Ambulatory Visit: Payer: Self-pay

## 2013-12-22 DIAGNOSIS — E871 Hypo-osmolality and hyponatremia: Secondary | ICD-10-CM

## 2013-12-22 DIAGNOSIS — R739 Hyperglycemia, unspecified: Secondary | ICD-10-CM

## 2014-01-06 NOTE — Addendum Note (Signed)
Addended by: Sandford Craze'SULLIVAN, Keirah Konitzer on: 01/06/2014 10:52 AM   Modules accepted: Orders

## 2014-01-11 ENCOUNTER — Other Ambulatory Visit: Payer: Self-pay | Admitting: Family

## 2014-01-13 ENCOUNTER — Ambulatory Visit: Payer: BC Managed Care – PPO | Admitting: Family

## 2014-01-25 ENCOUNTER — Encounter: Payer: Self-pay | Admitting: Family

## 2014-01-25 ENCOUNTER — Ambulatory Visit (INDEPENDENT_AMBULATORY_CARE_PROVIDER_SITE_OTHER): Payer: BC Managed Care – PPO | Admitting: Family

## 2014-01-25 VITALS — BP 140/100 | HR 111 | Temp 98.0°F | Resp 16 | Ht 64.25 in | Wt 256.0 lb

## 2014-01-25 DIAGNOSIS — F32A Depression, unspecified: Secondary | ICD-10-CM

## 2014-01-25 DIAGNOSIS — I10 Essential (primary) hypertension: Secondary | ICD-10-CM

## 2014-01-25 DIAGNOSIS — R739 Hyperglycemia, unspecified: Secondary | ICD-10-CM

## 2014-01-25 DIAGNOSIS — R7309 Other abnormal glucose: Secondary | ICD-10-CM

## 2014-01-25 DIAGNOSIS — F3289 Other specified depressive episodes: Secondary | ICD-10-CM

## 2014-01-25 DIAGNOSIS — E871 Hypo-osmolality and hyponatremia: Secondary | ICD-10-CM

## 2014-01-25 DIAGNOSIS — F329 Major depressive disorder, single episode, unspecified: Secondary | ICD-10-CM

## 2014-01-25 MED ORDER — ESCITALOPRAM OXALATE 20 MG PO TABS: ORAL_TABLET | ORAL | Status: DC

## 2014-01-25 MED ORDER — METOPROLOL SUCCINATE ER 50 MG PO TB24
ORAL_TABLET | ORAL | Status: DC
Start: 2014-01-25 — End: 2014-05-08

## 2014-01-25 NOTE — Progress Notes (Signed)
Subjective:    Patient ID: Tina Figueroa, female    DOB: 30-Sep-1980, 33 y.o.   MRN: [id number]  HPI  TinaFigueroa is a 33 yr old female who presents today for follow up of her depression.  Last visit her lexapro dose was increased form 10mg  to 20 mg. Reports that she initially felt drowsy, now feels better.  Feels less anxious, less emotional, sleeping well.    BP Readings from Last 3 Encounters:  12/14/13 128/90  09/09/13 120/90  03/09/13 146/80   Hyperglycemia- last visit her random sugar was noted to be elevated at 124.  She does have a family history of diabetes.   Review of Systems See HPI  Past Medical History  Diagnosis Date  . PCOS (polycystic ovarian syndrome)   . History of chicken pox   . Elevated blood pressure reading without diagnosis of hypertension   . Palpitations   . Mild hyperlipidemia 03/18/2012  . OSA (obstructive sleep apnea) 03/25/2012    Severe OSA per sleep study 10/13.   Marland Kitchen. Hypertension     History   Social History  . Marital Status: Single    Spouse Name: N/A    Number of Children: 0  . Years of Education: N/A   Occupational History  . Government social research officerffice Assistant    Social History Main Topics  . Smoking status: Never Smoker   . Smokeless tobacco: Never Used  . Alcohol Use: Yes     Comment: Rarely  . Drug Use: No  . Sexual Activity: Not on file   Other Topics Concern  . 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 Tonsillectomy    . Wisdom tooth extraction    . Dilation and curettage of uterus    . Adenoidectomy  1989    Family History  Problem Relation Age of Onset  . Heart disease Mother     CHF  . Hypertension Mother   . Depression Mother   . Arthritis Father   . Diabetes  Maternal Aunt   . Arthritis Maternal Uncle   . Diabetes Paternal Uncle   . Arthritis Maternal Grandmother   . Cancer Maternal Grandmother     lung  . Arthritis Paternal Grandmother   . Cancer Paternal Grandmother     breast  . Stroke Paternal Grandmother   . Diabetes Paternal Grandmother   . Cancer Paternal Grandfather     colon and prostate  . Cancer Other     aunt  . Asthma Mother   . Polycystic ovary syndrome Mother     Allergies  Allergen Reactions  . Flagyl [Metronidazole Hcl]     Chalky and coated tongue  . Other     Current Outpatient Prescriptions on File Prior to Visit  Medication Sig Dispense Refill  . betamethasone dipropionate (DIPROLENE) 0.05 % cream Apply topically 2 (two) times daily.  30 g  0  . Eflornithine HCl 13.9 % cream Apply a thin layer to affected areas on face/neck twice daily, at least 8 hrs apart. Rub in thoroughly. Do not wash treated area for at least 4 hrs.  30 g  2  . escitalopram (LEXAPRO) 20 MG tablet TAKE 1 TABLET BY MOUTH DAILY  30 tablet  3  . hydrochlorothiazide (HYDRODIURIL) 25 MG tablet Take 1 tablet (25 mg total) by mouth daily.  30 tablet  3  . metoprolol succinate (TOPROL-XL) 50 MG 24 hr tablet TAKE 1 TABLET BY MOUTH EVERY DAY . TAKE WITH OR IMMEDIATELY FOLLOWING A MEAL .  30 tablet  3  . Multiple Vitamin (MULTIVITAMIN) tablet Take 1 tablet by mouth daily.      . Norethin Ace-Eth Estrad-FE (MINASTRIN 24 FE) 1-20 MG-MCG(24) CHEW Chew 1 tablet by mouth daily.      . norethindrone-ethinyl estradiol (MICROGESTIN,JUNEL,LOESTRIN) 1-20 MG-MCG tablet Take 1 tablet by mouth daily.  1 Package  5  . potassium chloride 20 MEQ/15ML (10%) solution Take 15 mLs (20 mEq total) by mouth daily.  500 mL  2  . fluticasone (FLONASE) 50 MCG/ACT nasal spray Place 2 sprays into the nose as needed.       No current facility-administered medications on file prior to visit.    Pulse 111  Temp(Src) 98 F (36.7 C) (Oral)  Resp 16  Ht 5' 4.25" (1.632 m)  Wt  256 lb (116.121 kg)  BMI 43.60 kg/m2  SpO2 99%  LMP 12/28/2013       Objective:   Physical Exam  Constitutional: She is oriented to person, place, and time. She appears well-developed and well-nourished. No distress.  HENT:  Head: Normocephalic and atraumatic.  Cardiovascular: Normal rate and regular rhythm.   No murmur heard. Pulmonary/Chest: Effort normal and breath sounds normal. No respiratory distress. She has no wheezes. She has no rales. She exhibits no tenderness.  Musculoskeletal: She exhibits no edema.  Neurological: She is alert and oriented to person, place, and time.  Psychiatric: She has a normal mood and affect. Her behavior is normal. Judgment and thought content normal.          Assessment & Plan:

## 2014-01-25 NOTE — Patient Instructions (Signed)
Return to lab at your earliest convenience. Increase metoprolol to 1.5 tabs once daily. Follow up in 1 month for Blood pressure check.

## 2014-01-25 NOTE — Progress Notes (Signed)
Pre visit review using our clinic review tool, if applicable. No additional management support is needed unless otherwise documented below in the visit note. 

## 2014-01-27 DIAGNOSIS — E871 Hypo-osmolality and hyponatremia: Secondary | ICD-10-CM | POA: Insufficient documentation

## 2014-01-27 DIAGNOSIS — R739 Hyperglycemia, unspecified: Secondary | ICD-10-CM | POA: Insufficient documentation

## 2014-01-27 NOTE — Assessment & Plan Note (Signed)
Stable on hctz, continue same.  

## 2014-01-27 NOTE — Assessment & Plan Note (Signed)
Mild, likely secondary to hctz, obtain follow up bmet.

## 2014-01-27 NOTE — Assessment & Plan Note (Signed)
Obtain A1c.  

## 2014-01-27 NOTE — Assessment & Plan Note (Signed)
Stable and improved on lexapro 20mg . Continue same.

## 2014-03-01 ENCOUNTER — Ambulatory Visit (INDEPENDENT_AMBULATORY_CARE_PROVIDER_SITE_OTHER): Payer: BC Managed Care – PPO | Admitting: Family

## 2014-03-01 VITALS — BP 128/82

## 2014-03-01 DIAGNOSIS — I10 Essential (primary) hypertension: Secondary | ICD-10-CM

## 2014-03-01 NOTE — Progress Notes (Signed)
Pre visit review using our clinic review tool, if applicable. No additional management support is needed unless otherwise documented below in the visit note. 

## 2014-03-02 ENCOUNTER — Encounter: Payer: Self-pay | Admitting: Pulmonary Disease

## 2014-03-02 ENCOUNTER — Ambulatory Visit (INDEPENDENT_AMBULATORY_CARE_PROVIDER_SITE_OTHER): Payer: BC Managed Care – PPO | Admitting: Pulmonary Disease

## 2014-03-02 VITALS — BP 122/79 | HR 77 | Temp 98.0°F | Ht 64.5 in | Wt 261.6 lb

## 2014-03-02 DIAGNOSIS — G4733 Obstructive sleep apnea (adult) (pediatric): Secondary | ICD-10-CM

## 2014-03-02 NOTE — Assessment & Plan Note (Signed)
Home sleep study CPAP titration study We will then set you up with cpap   The pathophysiology of obstructive sleep apnea , it's cardiovascular consequences & modes of treatment including CPAP were discused with the patient in detail & they evidenced understanding.

## 2014-03-02 NOTE — Progress Notes (Signed)
   Subjective:    Patient ID: Tina Figueroa, female    DOB: 1980-09-01, 33 y.o.   MRN: [id number]  HPI  Tina Figueroa presents today for nurse visit BP check.    Review of Systems     Objective:   Physical Exam        Assessment & Plan:

## 2014-03-02 NOTE — Patient Instructions (Signed)
Home sleep study CPAP titration study We will then set you up with cpap

## 2014-03-02 NOTE — Assessment & Plan Note (Signed)
Improved. Continue toprol xl along with hctz.

## 2014-03-02 NOTE — Progress Notes (Signed)
   Subjective:    Patient ID: Tina Figueroa, female    DOB: 04-Aug-1980, 33 y.o.   MRN: [id number]  HPI Initial OV 04/2012  33 y.o obese woman, hypertensive referred for evaluation of obstructive sleep apnea   Home study showed AHI 34/h with nadir satn of 85% Placed on autoCPAP, but did not tolerate and returned the machine. She now presents for reevaluation since symptoms have worsened  Chief Complaint  Patient presents with  . Follow-up    Pt used to be on cpap but turned this in back in March 2014. Pt states PCP recommended that she follow-up again due to having difficulty sleeping.    ESS 6  Bedtime is 1am, sleep latency variable & can be as long as 1 h, she sleeps on her stomach or side with 3-4 pillows, 3-4 awakenings, spontaneous, oob at 8am feeling tired, no dryness or headaches  Wt has fluctuated from 230-278 lbs, 261 today  No excess cafffeinated beverages  fianc has noted loud snoring, witnessed apneas & she has woken herself up with coughing & gasping episodes.  Past Medical History  Diagnosis Date  . PCOS (polycystic ovarian syndrome)   . History of chicken pox   . Elevated blood pressure reading without diagnosis of hypertension   . Palpitations   . Mild hyperlipidemia 03/18/2012  . OSA (obstructive sleep apnea) 03/25/2012    Severe OSA per sleep study 10/13.   Marland Kitchen Hypertension     Review of Systems neg for any significant sore throat, dysphagia, itching, sneezing, nasal congestion or excess/ purulent secretions, fever, chills, sweats, unintended wt loss, pleuritic or exertional cp, hempoptysis, orthopnea pnd or change in chronic leg swelling. Also denies presyncope, palpitations, heartburn, abdominal pain, nausea, vomiting, diarrhea or change in bowel or urinary habits, dysuria,hematuria, rash, arthralgias, visual complaints, headache, numbness weakness or ataxia.     Objective:   Physical Exam  Gen. Pleasant, obese, in no distress ENT - no lesions, no post nasal  drip Neck: No JVD, no thyromegaly, no carotid bruits Lungs: no use of accessory muscles, no dullness to percussion, decreased without rales or rhonchi  Cardiovascular: Rhythm regular, heart sounds  normal, no murmurs or gallops, no peripheral edema Musculoskeletal: No deformities, no cyanosis or clubbing , no tremors       Assessment & Plan:

## 2014-03-08 ENCOUNTER — Ambulatory Visit: Payer: BC Managed Care – PPO

## 2014-03-09 ENCOUNTER — Ambulatory Visit: Payer: BC Managed Care – PPO

## 2014-03-11 ENCOUNTER — Other Ambulatory Visit: Payer: Self-pay | Admitting: Pulmonary Disease

## 2014-03-11 DIAGNOSIS — G4733 Obstructive sleep apnea (adult) (pediatric): Secondary | ICD-10-CM

## 2014-03-18 ENCOUNTER — Other Ambulatory Visit: Payer: Self-pay | Admitting: Family

## 2014-03-19 NOTE — Telephone Encounter (Signed)
Ok to send 1 month supply no refills, but needs follow up please for BP recheck.

## 2014-03-19 NOTE — Telephone Encounter (Signed)
Medication Detail      Disp Refills Start End     hydrochlorothiazide (HYDRODIURIL) 25 MG tablet 30 tablet 3 03/09/2013     Sig - Route: Take 1 tablet (25 mg total) by mouth daily. - Oral    E-Prescribing Status: Receipt confirmed by pharmacy (03/09/2013 11:57 AM EDT)    Patient last seen 08.10.15, to return in [1] month for blood pressure check; requested Rx last filled 09.22.2014, #30x3/SLS Please Advise on refills.

## 2014-03-23 NOTE — Telephone Encounter (Signed)
  HTN (hypertension) - Sandford CrazeMelissa O'Sullivan, NP at 03/02/2014 4:00 PM     Status: Written Related Problem: HTN (hypertension)    Improved. Continue toprol xl along with hctz                     BP 128/82   Rx request to pharmacy/SLS  Please call patient and schedule Nurse Visit for BP Check per provider/SLS Thanks.

## 2014-03-23 NOTE — Telephone Encounter (Signed)
Informed patient of this and scheduled nurse visit for tomorrow

## 2014-03-24 ENCOUNTER — Ambulatory Visit (INDEPENDENT_AMBULATORY_CARE_PROVIDER_SITE_OTHER): Payer: BC Managed Care – PPO

## 2014-03-24 DIAGNOSIS — Z23 Encounter for immunization: Secondary | ICD-10-CM

## 2014-03-26 ENCOUNTER — Other Ambulatory Visit: Payer: Self-pay | Admitting: Family

## 2014-03-26 NOTE — Telephone Encounter (Signed)
Rx request to pharmacy/[facility]  

## 2014-04-14 ENCOUNTER — Other Ambulatory Visit: Payer: Self-pay | Admitting: Family

## 2014-04-14 DIAGNOSIS — E871 Hypo-osmolality and hyponatremia: Secondary | ICD-10-CM

## 2014-04-14 DIAGNOSIS — R739 Hyperglycemia, unspecified: Secondary | ICD-10-CM

## 2014-04-14 MED ORDER — HYDROCHLOROTHIAZIDE 25 MG PO TABS: ORAL_TABLET | ORAL | Status: DC

## 2014-04-19 ENCOUNTER — Other Ambulatory Visit (INDEPENDENT_AMBULATORY_CARE_PROVIDER_SITE_OTHER): Payer: BC Managed Care – PPO

## 2014-04-19 DIAGNOSIS — R739 Hyperglycemia, unspecified: Secondary | ICD-10-CM

## 2014-04-19 DIAGNOSIS — E871 Hypo-osmolality and hyponatremia: Secondary | ICD-10-CM

## 2014-04-19 LAB — BASIC METABOLIC PANEL
BUN: 10 mg/dL (ref 6–23)
CO2: 22 mEq/L (ref 19–32)
Calcium: 8.9 mg/dL (ref 8.4–10.5)
Chloride: 102 mEq/L (ref 96–112)
Creatinine, Ser: 0.9 mg/dL (ref 0.4–1.2)
GFR: 89.41 mL/min (ref >60.00)
Glucose, Bld: 137 mg/dL — ABNORMAL HIGH (ref 70–99)
Potassium: 3.7 mEq/L (ref 3.5–5.1)
SODIUM: 136 meq/L (ref 135–145)

## 2014-04-19 LAB — HEMOGLOBIN A1C: Hgb A1c MFr Bld: 4.8 % (ref 4.6–6.5)

## 2014-04-20 ENCOUNTER — Encounter: Payer: Self-pay | Admitting: Family

## 2014-05-08 ENCOUNTER — Other Ambulatory Visit: Payer: Self-pay | Admitting: Family

## 2014-05-10 ENCOUNTER — Encounter: Payer: Self-pay | Admitting: Family

## 2014-05-10 MED ORDER — MELOXICAM 7.5 MG PO TABS: 7.5000 mg | ORAL_TABLET | Freq: Every day | ORAL | Status: DC

## 2014-05-10 NOTE — Telephone Encounter (Signed)
Medication Detail      Disp Refills Start End     metoprolol succinate (TOPROL-XL) 50 MG 24 hr tablet 45 tablet 2 01/25/2014     Sig: 1.5 tabs (75mg )by mouth once daily    E-Prescribing Status: Receipt confirmed by pharmacy (01/25/2014 4:24 PM EDT)      Rx request to pharmacy/SLS

## 2014-05-12 ENCOUNTER — Other Ambulatory Visit: Payer: Self-pay | Admitting: Family

## 2014-05-12 NOTE — Telephone Encounter (Signed)
HCTZ refill sent to pharmacy. Pt was last seen for BP check in 02/2014 and has no future appts scheduled with PCP. When should pt follow up in the office again?

## 2014-05-15 NOTE — Telephone Encounter (Signed)
Pt needs 3 month follow up please.

## 2014-05-17 NOTE — Telephone Encounter (Signed)
Please call pt to arrange appt. 

## 2014-05-18 ENCOUNTER — Encounter (HOSPITAL_BASED_OUTPATIENT_CLINIC_OR_DEPARTMENT_OTHER): Payer: BC Managed Care – PPO

## 2014-05-23 ENCOUNTER — Other Ambulatory Visit: Payer: Self-pay | Admitting: Family

## 2014-05-26 ENCOUNTER — Other Ambulatory Visit: Payer: Self-pay | Admitting: Family

## 2014-06-08 ENCOUNTER — Other Ambulatory Visit: Payer: Self-pay | Admitting: Family

## 2014-06-08 NOTE — Telephone Encounter (Signed)
Ok to send refill. Lets bring her back in January pls

## 2014-06-08 NOTE — Telephone Encounter (Signed)
Please advise Meloxicam refill. #14 tabs given 05/24/14.  Pt last seen by us in 01/2014. When should she follow up in the office?

## 2014-06-08 NOTE — Telephone Encounter (Signed)
Refill sent. Please call pt to arrange appt as below.

## 2014-06-23 ENCOUNTER — Other Ambulatory Visit: Payer: Self-pay | Admitting: Family

## 2014-06-23 NOTE — Telephone Encounter (Signed)
Pt last seen 01/25/14 and advised 1 month follow up of BP. Pt is past due. Meloxicam refill denied.

## 2014-07-11 ENCOUNTER — Other Ambulatory Visit: Payer: Self-pay | Admitting: Family

## 2014-07-12 MED ORDER — MELOXICAM 7.5 MG PO TABS: 7.5000 mg | ORAL_TABLET | Freq: Every day | ORAL | Status: DC

## 2014-07-12 NOTE — Telephone Encounter (Signed)
Pt last seen in August and is past due for f/u of BP.  Please advise Meloxicam request.

## 2014-07-13 NOTE — Telephone Encounter (Signed)
Please call pt to arrange follow up of her BP as she is past due.  Thanks!

## 2014-07-14 NOTE — Telephone Encounter (Signed)
Left detailed message informing patient of med refill and to call our office to schedule appointment. °

## 2014-07-15 LAB — HM PAP SMEAR: HM Pap smear: NORMAL

## 2014-07-21 ENCOUNTER — Ambulatory Visit: Payer: Self-pay | Admitting: Family

## 2014-07-23 ENCOUNTER — Other Ambulatory Visit: Payer: Self-pay | Admitting: Family

## 2014-07-23 NOTE — Telephone Encounter (Signed)
Tina Figueroa-- please advise meloxicam refill request. Pt has upcoming appt on 07/30/14.

## 2014-07-27 ENCOUNTER — Encounter (HOSPITAL_BASED_OUTPATIENT_CLINIC_OR_DEPARTMENT_OTHER): Payer: BC Managed Care – PPO

## 2014-07-30 ENCOUNTER — Ambulatory Visit (INDEPENDENT_AMBULATORY_CARE_PROVIDER_SITE_OTHER): Payer: BLUE CROSS/BLUE SHIELD | Admitting: Family

## 2014-07-30 ENCOUNTER — Telehealth: Payer: Self-pay | Admitting: Family

## 2014-07-30 ENCOUNTER — Encounter: Payer: Self-pay | Admitting: Family

## 2014-07-30 VITALS — BP 129/84 | HR 97 | Temp 98.6°F | Ht 64.5 in | Wt 262.8 lb

## 2014-07-30 DIAGNOSIS — I10 Essential (primary) hypertension: Secondary | ICD-10-CM

## 2014-07-30 DIAGNOSIS — F32A Depression, unspecified: Secondary | ICD-10-CM

## 2014-07-30 DIAGNOSIS — G4733 Obstructive sleep apnea (adult) (pediatric): Secondary | ICD-10-CM

## 2014-07-30 DIAGNOSIS — F329 Major depressive disorder, single episode, unspecified: Secondary | ICD-10-CM

## 2014-07-30 LAB — BASIC METABOLIC PANEL
BUN: 10 mg/dL (ref 6–23)
CHLORIDE: 101 meq/L (ref 96–112)
CO2: 29 mEq/L (ref 19–32)
Calcium: 9.2 mg/dL (ref 8.4–10.5)
Creatinine, Ser: 0.85 mg/dL (ref 0.40–1.20)
GFR: 97.8 mL/min (ref >60.00)
GLUCOSE: 93 mg/dL (ref 70–99)
Potassium: 2.8 mEq/L — CL (ref 3.5–5.1)
Sodium: 137 mEq/L (ref 135–145)

## 2014-07-30 NOTE — Telephone Encounter (Signed)
Received call from lab, potassium is 2.8.  Please contact patient and ask if she has been taking her potassium supplement.  I would recommend that she take 5430ml's of her potassium solution now and 30 ml's in 4 hours. Beginning tomorrow restart 15mls once daily.  Repeat bmet in 1 week. Dx is hypokalemia.

## 2014-07-30 NOTE — Patient Instructions (Signed)
Please complete lab work prior to leaving. Keep working on [employer] diet, exercise, and weight loss. Schedule a fasting physical at the front desk.

## 2014-07-30 NOTE — Assessment & Plan Note (Signed)
Stable on current meds.  Obtain bmet.  

## 2014-07-30 NOTE — Telephone Encounter (Signed)
Patient returned phone call. Best# [phone]

## 2014-07-30 NOTE — Telephone Encounter (Signed)
Pt notified and made aware.  Melissa's recommendations discussed with patient.  She stated understanding and was able to read back recommendations with accuracy.  She has an CPE appointment scheduled for Tuesday. Will repeat BMET at that time.

## 2014-07-30 NOTE — Assessment & Plan Note (Signed)
Stable on lexapro, continue same.  

## 2014-07-30 NOTE — Telephone Encounter (Signed)
Left a message for call back.  

## 2014-07-30 NOTE — Progress Notes (Signed)
Pre visit review using our clinic review tool, if applicable. No additional management support is needed unless otherwise documented below in the visit note. 

## 2014-07-30 NOTE — Progress Notes (Signed)
Subjective:    Patient ID: Tina Figueroa, female    DOB: 06-Jul-1980, 34 y.o.   MRN: [id number]  HPI Ms. Braziel is here today for follow up.  1. HYPERTENSION: Reports compliance with metoprolol 50 mg and HCTZ. 25 mg. Denies chest pain, dyspnea, or lower extremity edema. Walks 3-4 days per week for 30-45 minutes. Avoids sweets and junk food. BP Readings from Last 3 Encounters:  07/30/14 129/84  03/02/14 122/79  03/01/14 128/82   Wt Readings from Last 3 Encounters:  07/30/14 262 lb 12.8 oz (119.205 kg)  03/02/14 261 lb 9.6 oz (118.661 kg)  01/25/14 256 lb (116.121 kg)   2. OSA: She has not received CPAP. She state her new insurance is making her have another sleep study in April in the lab rather than at home.  3. Depression: She is maintained on lexapro 20 mg and reports that her mood is good.   4. Reports waking from sleep at night for no reason and then is up for 1-2 hours. This happens every night. She doesn't know what she is waking. Reports daytime sleepiness.  Review of Systems  Constitutional: Positive for fatigue. Negative for fever and chills.  Respiratory: Negative for cough and shortness of breath.   Cardiovascular: Negative for chest pain, palpitations and leg swelling.       Past Medical History  Diagnosis Date  . PCOS (polycystic ovarian syndrome)   . History of chicken pox   . Elevated blood pressure reading without diagnosis of hypertension   . Palpitations   . Mild hyperlipidemia 03/18/2012  . OSA (obstructive sleep apnea) 03/25/2012    Severe OSA per sleep study 10/13.   Marland Kitchen Hypertension     History   Social History  . Marital Status: Single    Spouse Name: N/A  . Number of Children: 0  . Years of Education: N/A   Occupational History  . Government social research officer    Social History Main Topics  . Smoking status: Never Smoker   . Smokeless tobacco: Never Used  . Alcohol Use: Yes     Comment: Rarely  . Drug Use: No  . Sexual Activity: Not on file    Other Topics Concern  . 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 Tonsillectomy    . Wisdom tooth extraction    . Dilation and curettage of uterus    . Adenoidectomy  1989    Family History  Problem Relation Age of Onset  . Heart disease Mother     CHF  . Hypertension Mother   . Depression Mother   . Arthritis Father   . Diabetes Maternal Aunt   . Arthritis Maternal Uncle   . Diabetes Paternal Uncle   . Arthritis Maternal Grandmother   . Cancer Maternal Grandmother     lung  . Arthritis Paternal Grandmother   . Cancer Paternal Grandmother     breast  . Stroke Paternal Grandmother   . Diabetes Paternal Grandmother   . Cancer Paternal Grandfather     colon and prostate  . Cancer Other     aunt  .  Asthma Mother   . Polycystic ovary syndrome Mother     Allergies  Allergen Reactions  . Flagyl [Metronidazole Hcl]     Chalky and coated tongue  . Other     Current Outpatient Prescriptions on File Prior to Visit  Medication Sig Dispense Refill  . betamethasone dipropionate (DIPROLENE) 0.05 % cream Apply topically 2 (two) times daily. 30 g 0  . Eflornithine HCl 13.9 % cream Apply a thin layer to affected areas on face/neck twice daily, at least 8 hrs apart. Rub in thoroughly. Do not wash treated area for at least 4 hrs. 30 g 2  . escitalopram (LEXAPRO) 20 MG tablet TAKE 1 TABLET BY MOUTH DAILY 30 tablet 0  . fluticasone (FLONASE) 50 MCG/ACT nasal spray Place 2 sprays into the nose as needed.    . hydrochlorothiazide (HYDRODIURIL) 25 MG tablet TAKE 1 TABLET BY MOUTH EVERY DAY 30 tablet 2  . meloxicam (MOBIC) 7.5 MG tablet TAKE 1 TABLET BY MOUTH DAILY 14 tablet 0  . metoprolol succinate (TOPROL-XL) 50 MG 24  hr tablet TAKE 1.5 TABLETS BY MOUTH ONCE DAILY 45 tablet 2  . MICROGESTIN 1-20 MG-MCG tablet TAKE 1 TABLET BY MOUTH EVERY DAY 21 tablet 5  . Multiple Vitamin (MULTIVITAMIN) tablet Take 1 tablet by mouth daily.    . potassium chloride 20 MEQ/15ML (10%) solution Take 15 mLs (20 mEq total) by mouth daily. 500 mL 2   No current facility-administered medications on file prior to visit.    BP 129/84 mmHg  Pulse 97  Temp(Src) 98.6 F (37 C) (Oral)  Ht 5' 4.5" (1.638 m)  Wt 262 lb 12.8 oz (119.205 kg)  BMI 44.43 kg/m2  SpO2 90%  LMP 07/07/2014    Objective:   Physical Exam  Constitutional: She appears well-developed and well-nourished.  Cardiovascular: Normal rate, regular rhythm and normal heart sounds.   No murmur heard. Pulmonary/Chest: Effort normal and breath sounds normal. No respiratory distress. She has no wheezes.  Psychiatric: She has a normal mood and affect. Her behavior is normal. Judgment and thought content normal.          Assessment & Plan:

## 2014-07-30 NOTE — Assessment & Plan Note (Signed)
She has upcoming in lab sleep study.advised pt to keep this appointment. Getting her OSA controlled, will help her sleep, fatigue and BP. We also discussed the importance of diet/exercise/weight loss.

## 2014-08-03 ENCOUNTER — Encounter: Payer: BLUE CROSS/BLUE SHIELD | Admitting: Family

## 2014-08-07 ENCOUNTER — Other Ambulatory Visit: Payer: Self-pay | Admitting: Family

## 2014-08-08 ENCOUNTER — Other Ambulatory Visit: Payer: Self-pay | Admitting: Family

## 2014-08-16 ENCOUNTER — Encounter: Payer: Self-pay | Admitting: Family

## 2014-08-16 NOTE — Telephone Encounter (Signed)
Ok to send 90 day supplies of refills. Please contact pt re: scheduling cpx.

## 2014-08-17 MED ORDER — ESCITALOPRAM OXALATE 20 MG PO TABS: 20.0000 mg | ORAL_TABLET | Freq: Every day | ORAL | Status: DC

## 2014-08-17 MED ORDER — HYDROCHLOROTHIAZIDE 25 MG PO TABS: 25.0000 mg | ORAL_TABLET | Freq: Every day | ORAL | Status: DC

## 2014-08-17 MED ORDER — NORETHINDRONE ACET-ETHINYL EST 1-20 MG-MCG PO TABS: 1.0000 | ORAL_TABLET | Freq: Every day | ORAL | Status: DC

## 2014-08-17 MED ORDER — POTASSIUM CHLORIDE 20 MEQ/15ML (10%) PO SOLN: 20.0000 meq | Freq: Every day | ORAL | Status: DC

## 2014-08-17 MED ORDER — METOPROLOL SUCCINATE ER 50 MG PO TB24: ORAL_TABLET | ORAL | Status: DC

## 2014-08-17 NOTE — Telephone Encounter (Signed)
Refills sent. No available CPE slots this week.

## 2014-08-18 ENCOUNTER — Other Ambulatory Visit: Payer: Self-pay | Admitting: Family

## 2014-08-18 NOTE — Telephone Encounter (Signed)
Please advise?   Medication name:  Name from pharmacy:  meloxicam (MOBIC) 7.5 MG tablet MELOXICAM 7.5MG  TABLETS     Sig: TAKE 1 TABLET BY MOUTH EVERY DAY    Dispense: 14 tablet   Refills: 0   Start: 08/18/2014   Class: Normal    Requested on: 08/18/2014    Originally ordered on: 05/10/2014 08/07/2014

## 2014-08-27 ENCOUNTER — Ambulatory Visit (INDEPENDENT_AMBULATORY_CARE_PROVIDER_SITE_OTHER): Payer: BLUE CROSS/BLUE SHIELD | Admitting: Family

## 2014-08-27 ENCOUNTER — Encounter: Payer: Self-pay | Admitting: Family

## 2014-08-27 VITALS — BP 108/70 | HR 88 | Temp 99.0°F | Resp 18 | Ht 64.5 in | Wt 262.6 lb

## 2014-08-27 DIAGNOSIS — Z0000 Encounter for general adult medical examination without abnormal findings: Secondary | ICD-10-CM

## 2014-08-27 LAB — BASIC METABOLIC PANEL
BUN: 4 mg/dL — ABNORMAL LOW (ref 6–23)
CO2: 25 mEq/L (ref 19–32)
Calcium: 8.9 mg/dL (ref 8.4–10.5)
Chloride: 107 mEq/L (ref 96–112)
Creatinine, Ser: 0.77 mg/dL (ref 0.40–1.20)
GFR: 109.57 mL/min (ref >60.00)
Glucose, Bld: 80 mg/dL (ref 70–99)
POTASSIUM: 3 meq/L — AB (ref 3.5–5.1)
Sodium: 137 mEq/L (ref 135–145)

## 2014-08-27 LAB — CBC WITH DIFFERENTIAL/PLATELET
Basophils Absolute: 0 10*3/uL (ref 0.0–0.1)
Basophils Relative: 0.5 % (ref 0.0–3.0)
Eosinophils Absolute: 0.2 10*3/uL (ref 0.0–0.7)
Eosinophils Relative: 1.8 % (ref 0.0–5.0)
HCT: 40.2 % (ref 36.0–46.0)
HEMOGLOBIN: 13.8 g/dL (ref 12.0–15.0)
LYMPHS PCT: 30.1 % (ref 12.0–46.0)
Lymphs Abs: 2.6 10*3/uL (ref 0.7–4.0)
MCHC: 34.3 g/dL (ref 30.0–36.0)
MCV: 90.9 fl (ref 78.0–100.0)
MONOS PCT: 4.4 % (ref 3.0–12.0)
Monocytes Absolute: 0.4 10*3/uL (ref 0.1–1.0)
NEUTROS ABS: 5.4 10*3/uL (ref 1.4–7.7)
NEUTROS PCT: 63.2 % (ref 43.0–77.0)
Platelets: 306 10*3/uL (ref 150.0–400.0)
RBC: 4.43 Mil/uL (ref 3.87–5.11)
RDW: 12.6 % (ref 11.5–15.5)
WBC: 8.5 10*3/uL (ref 4.0–10.5)

## 2014-08-27 LAB — URINALYSIS, ROUTINE W REFLEX MICROSCOPIC
HGB URINE DIPSTICK: NEGATIVE
KETONES UR: NEGATIVE
Leukocytes, UA: NEGATIVE
Nitrite: NEGATIVE
PH: 5.5 (ref 5.0–8.0)
Specific Gravity, Urine: 1.02 (ref 1.000–1.030)
Urine Glucose: NEGATIVE
Urobilinogen, UA: 1 (ref 0.0–1.0)

## 2014-08-27 LAB — HEPATIC FUNCTION PANEL
ALK PHOS: 91 U/L (ref 39–117)
ALT: 55 U/L — AB (ref 0–35)
AST: 22 U/L (ref 0–37)
Albumin: 3.8 g/dL (ref 3.5–5.2)
BILIRUBIN DIRECT: 0.2 mg/dL (ref 0.0–0.3)
Total Bilirubin: 0.6 mg/dL (ref 0.2–1.2)
Total Protein: 7.4 g/dL (ref 6.0–8.3)

## 2014-08-27 LAB — LIPID PANEL
CHOLESTEROL: 219 mg/dL — AB (ref 0–200)
HDL: 45.2 mg/dL (ref >39.00)
LDL Cholesterol: 162 mg/dL — ABNORMAL HIGH (ref 0–99)
NonHDL: 173.8
Total CHOL/HDL Ratio: 5
Triglycerides: 60 mg/dL (ref 0.0–149.0)
VLDL: 12 mg/dL (ref 0.0–40.0)

## 2014-08-27 LAB — TSH: TSH: 2.59 u[IU]/mL (ref 0.35–4.50)

## 2014-08-27 NOTE — Patient Instructions (Signed)
Please complete lab work prior to leaving.  Follow up in 6 months.  

## 2014-08-27 NOTE — Progress Notes (Signed)
Subjective:    Patient ID: Tina Figueroa, female    DOB: Nov 15, 1980, 34 y.o.   MRN: [id number]  HPI  Patient presents today for complete physical.  Immunizations:up to date Diet: inconsistent  Wt Readings from Last 3 Encounters:  08/27/14 262 lb 9.6 oz (119.115 kg)  07/30/14 262 lb 12.8 oz (119.205 kg)  03/02/14 261 lb 9.6 oz (118.661 kg)  Exercise: some when she can Pap Smear: 1/16   Review of Systems  Constitutional: Negative for unexpected weight change.  HENT: Negative for hearing loss and rhinorrhea.   Eyes: Negative for visual disturbance.  Respiratory: Negative for cough.   Cardiovascular: Negative for leg swelling.  Gastrointestinal: Negative for nausea, diarrhea and constipation.  Genitourinary: Negative for dysuria and frequency.       Irregular periods  Musculoskeletal: Negative for myalgias and arthralgias.  Skin: Negative for rash.  Neurological: Negative for headaches.  Hematological: Negative for adenopathy.  Psychiatric/Behavioral: Negative for dysphoric mood and agitation.   Past Medical History  Diagnosis Date  . PCOS (polycystic ovarian syndrome)   . History of chicken pox   . Elevated blood pressure reading without diagnosis of hypertension   . Palpitations   . Mild hyperlipidemia 03/18/2012  . OSA (obstructive sleep apnea) 03/25/2012    Severe OSA per sleep study 10/13.   Marland Kitchen Hypertension     History   Social History  . Marital Status: Single    Spouse Name: N/A  . Number of Children: 0  . Years of Education: N/A   Occupational History  . Government social research officer    Social History Main Topics  . Smoking status: Never Smoker   . Smokeless tobacco: Never Used  . Alcohol Use: Yes     Comment: Rarely  . Drug Use: No  . Sexual Activity: Not on file   Other Topics Concern  . 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 Tonsillectomy    . Wisdom tooth extraction    . Dilation and curettage of uterus    . Adenoidectomy  1989    Family History  Problem Relation Age of Onset  . Heart disease Mother     CHF  . Hypertension Mother   . Depression Mother   . Arthritis Father   . Diabetes Maternal Aunt   . Arthritis Maternal Uncle   . Diabetes Paternal Uncle   . Arthritis Maternal Grandmother   . Cancer Maternal Grandmother     lung  . Arthritis Paternal Grandmother   . Cancer Paternal Grandmother     breast  . Stroke Paternal Grandmother   . Diabetes Paternal Grandmother   . Cancer Paternal Grandfather     colon and prostate  . Cancer Other     aunt  . Asthma Mother   . Polycystic ovary syndrome Mother     Allergies  Allergen Reactions  . Flagyl [Metronidazole Hcl]     Chalky and coated tongue  . Other     Current Outpatient Prescriptions on File Prior to Visit  Medication Sig Dispense Refill  . betamethasone dipropionate (DIPROLENE) 0.05 % cream Apply topically  2 (two) times daily. 30 g 0  . Eflornithine HCl 13.9 % cream Apply a thin layer to affected areas on face/neck twice daily, at least 8 hrs apart. Rub in thoroughly. Do not wash treated area for at least 4 hrs. 30 g 2  . escitalopram (LEXAPRO) 20 MG tablet Take 1 tablet (20 mg total) by mouth daily. 90 tablet 0  . fluticasone (FLONASE) 50 MCG/ACT nasal spray Place 2 sprays into the nose as needed.    . hydrochlorothiazide (HYDRODIURIL) 25 MG tablet Take 1 tablet (25 mg total) by mouth daily. 90 tablet 0  . meloxicam (MOBIC) 7.5 MG tablet TAKE 1 TABLET BY MOUTH EVERY DAY 14 tablet 0  . metoprolol succinate (TOPROL-XL) 50 MG 24 hr tablet Take 1 and 1/2 tablets by mouth daily with or immediately following a meal. 135 tablet 0  . Multiple Vitamin (MULTIVITAMIN) tablet Take 1 tablet by mouth daily.     . norethindrone-ethinyl estradiol (MICROGESTIN) 1-20 MG-MCG tablet Take 1 tablet by mouth daily. 3 Package 0  . potassium chloride 20 MEQ/15ML (10%) SOLN Take 15 mLs (20 mEq total) by mouth daily. 1500 mL 0   No current facility-administered medications on file prior to visit.    BP 108/70 mmHg  Pulse 125  Temp(Src) 99 F (37.2 C) (Oral)  Resp 18  Ht 5' 4.5" (1.638 m)  Wt 262 lb 9.6 oz (119.115 kg)  BMI 44.40 kg/m2  SpO2 98%  LMP 08/27/2014       Objective:   Physical Exam  Physical Exam  Constitutional: She is oriented to person, place, and time. She appears well-developed and well-nourished. No distress.  HENT:  Head: Normocephalic and atraumatic.  Right Ear: Tympanic membrane and ear canal normal.  Left Ear: Tympanic membrane and ear canal normal.  Mouth/Throat: Oropharynx is clear and moist.  Eyes: Pupils are equal, round, and reactive to light. No scleral icterus.  Neck: Normal range of motion. No thyromegaly present.  Cardiovascular: Normal rate and regular rhythm.   No murmur heard. Pulmonary/Chest: Effort normal and breath sounds normal. No respiratory distress. He has no wheezes. She has no rales. She exhibits no tenderness.  Abdominal: Soft. Bowel sounds are normal. He exhibits no distension and no mass. There is no tenderness. There is no rebound and no guarding.  Musculoskeletal: She exhibits no edema.  Lymphadenopathy:    She has no cervical adenopathy.  Neurological: She is alert and oriented to person, place, and time. She has normal patellar reflexes. She exhibits normal muscle tone. Coordination normal.  Skin: Skin is warm and dry.  Psychiatric: She has a normal mood and affect. Her behavior is normal. Judgment and thought content normal.  Breasts: Examined lying Right: Without masses, retractions, discharge or axillary adenopathy.  Left: Without masses, retractions, discharge or axillary adenopathy.      Assessment & Plan:        Assessment &  Plan:

## 2014-08-27 NOTE — Progress Notes (Signed)
Pre visit review using our clinic review tool, if applicable. No additional management support is needed unless otherwise documented below in the visit note. 

## 2014-08-28 NOTE — Assessment & Plan Note (Signed)
Discussed healthy diet, exercise, weight loss. Pap up to date.  Immunizations reviewed and up to date.  Obtain routine lab work.

## 2014-08-29 ENCOUNTER — Telehealth: Payer: Self-pay | Admitting: Family

## 2014-08-29 DIAGNOSIS — R945 Abnormal results of liver function studies: Secondary | ICD-10-CM

## 2014-08-29 DIAGNOSIS — R7989 Other specified abnormal findings of blood chemistry: Secondary | ICD-10-CM

## 2014-08-29 DIAGNOSIS — E876 Hypokalemia: Secondary | ICD-10-CM

## 2014-08-29 MED ORDER — POTASSIUM CHLORIDE CRYS ER 20 MEQ PO TBCR: 20.0000 meq | EXTENDED_RELEASE_TABLET | Freq: Every day | ORAL | Status: DC

## 2014-08-29 NOTE — Telephone Encounter (Signed)
LFT abnormal.  Recommend abd us to further evaluate.  Also, K is low.  Add Kdur 20 mEQ- 2 tabs by mouth today, then one tab by mouth once daily, repeat bmet in 1 week, dx hypokalemia.

## 2014-08-30 NOTE — Telephone Encounter (Signed)
Notified pt and she is agreeable to proceed with u/s. Lab appt scheduled for 09/08/14 at 8:15am and future order entered.

## 2014-08-31 ENCOUNTER — Encounter: Payer: BLUE CROSS/BLUE SHIELD | Admitting: Family

## 2014-09-04 ENCOUNTER — Encounter: Payer: Self-pay | Admitting: Family

## 2014-09-04 ENCOUNTER — Ambulatory Visit (HOSPITAL_BASED_OUTPATIENT_CLINIC_OR_DEPARTMENT_OTHER)
Admission: RE | Admit: 2014-09-04 | Discharge: 2014-09-04 | Disposition: A | Discharge status: Home / Self Care | Payer: BLUE CROSS/BLUE SHIELD | Source: Ambulatory Visit | Attending: Family | Admitting: Family

## 2014-09-04 DIAGNOSIS — K760 Fatty (change of) liver, not elsewhere classified: Secondary | ICD-10-CM | POA: Insufficient documentation

## 2014-09-04 DIAGNOSIS — K7689 Other specified diseases of liver: Secondary | ICD-10-CM | POA: Diagnosis not present

## 2014-09-04 DIAGNOSIS — R7989 Other specified abnormal findings of blood chemistry: Secondary | ICD-10-CM | POA: Diagnosis present

## 2014-09-07 ENCOUNTER — Other Ambulatory Visit: Payer: Self-pay | Admitting: Family

## 2014-09-08 ENCOUNTER — Telehealth: Payer: Self-pay | Admitting: *Deleted

## 2014-09-08 ENCOUNTER — Other Ambulatory Visit (INDEPENDENT_AMBULATORY_CARE_PROVIDER_SITE_OTHER): Payer: BLUE CROSS/BLUE SHIELD

## 2014-09-08 DIAGNOSIS — E876 Hypokalemia: Secondary | ICD-10-CM

## 2014-09-08 LAB — BASIC METABOLIC PANEL
BUN: 12 mg/dL (ref 6–23)
CHLORIDE: 102 meq/L (ref 96–112)
CO2: 26 meq/L (ref 19–32)
Calcium: 9.1 mg/dL (ref 8.4–10.5)
Creatinine, Ser: 0.86 mg/dL (ref 0.40–1.20)
GFR: 96.43 mL/min (ref >60.00)
Glucose, Bld: 133 mg/dL — ABNORMAL HIGH (ref 70–99)
Potassium: 2.8 mEq/L — CL (ref 3.5–5.1)
Sodium: 137 mEq/L (ref 135–145)

## 2014-09-08 NOTE — Telephone Encounter (Signed)
Please call patient and let her know that her potassium is very low.  I would like her to take KDur now, then repeat in 4 hours.  Stop hctz.  Return to lab tomorrow for follow up bmet.  Is she taking the potassium tab or liquid?

## 2014-09-08 NOTE — Telephone Encounter (Signed)
-----   Message from Eustace Quailavid J Reabold, RMA sent at 09/08/2014 11:46 AM EDT ----- Regarding: CRITICAL  Potassium 2.8

## 2014-09-08 NOTE — Telephone Encounter (Signed)
Notified pt and she voices understanding. Lab appt scheduled for 09/09/14 at 1:15pm.  Future lab order entered.

## 2014-09-09 ENCOUNTER — Other Ambulatory Visit (INDEPENDENT_AMBULATORY_CARE_PROVIDER_SITE_OTHER): Payer: BLUE CROSS/BLUE SHIELD

## 2014-09-09 DIAGNOSIS — E876 Hypokalemia: Secondary | ICD-10-CM | POA: Diagnosis not present

## 2014-09-09 LAB — BASIC METABOLIC PANEL
BUN: 10 mg/dL (ref 6–23)
CALCIUM: 9.1 mg/dL (ref 8.4–10.5)
CO2: 25 meq/L (ref 19–32)
Chloride: 107 mEq/L (ref 96–112)
Creatinine, Ser: 0.74 mg/dL (ref 0.40–1.20)
GFR: 114.69 mL/min (ref >60.00)
GLUCOSE: 69 mg/dL — AB (ref 70–99)
Potassium: 3.3 mEq/L — ABNORMAL LOW (ref 3.5–5.1)
SODIUM: 138 meq/L (ref 135–145)

## 2014-09-10 ENCOUNTER — Encounter: Payer: Self-pay | Admitting: Family

## 2014-09-10 NOTE — Telephone Encounter (Signed)
Please contact pt to arrange a nurse visit in 1 week (see mychart message).

## 2014-09-20 ENCOUNTER — Other Ambulatory Visit: Payer: Self-pay | Admitting: Family

## 2014-09-20 NOTE — Telephone Encounter (Signed)
Tina Figueroa-- It looks like we have sent #14 meloxicam twice a month for the last 2 months. Do we need to increase quantity and ok for pt to take daily?

## 2014-09-21 NOTE — Telephone Encounter (Signed)
Ok to change to 1 tab by mouth once daily prn #30 with 2 refills pls.

## 2014-09-21 NOTE — Telephone Encounter (Signed)
Quantity changed and sent to pharmacy.

## 2014-10-01 ENCOUNTER — Encounter (HOSPITAL_BASED_OUTPATIENT_CLINIC_OR_DEPARTMENT_OTHER): Payer: Self-pay

## 2014-11-22 ENCOUNTER — Other Ambulatory Visit: Payer: Self-pay | Admitting: Family

## 2014-11-23 ENCOUNTER — Other Ambulatory Visit: Payer: Self-pay | Admitting: Family

## 2014-11-25 ENCOUNTER — Other Ambulatory Visit: Payer: Self-pay | Admitting: Family

## 2014-11-26 NOTE — Telephone Encounter (Signed)
Rx sent to the pharmacy by e-script.//AB/CMA 

## 2015-02-28 ENCOUNTER — Ambulatory Visit: Payer: BLUE CROSS/BLUE SHIELD | Admitting: Family

## 2015-04-01 ENCOUNTER — Ambulatory Visit (INDEPENDENT_AMBULATORY_CARE_PROVIDER_SITE_OTHER): Payer: PRIVATE HEALTH INSURANCE | Admitting: Family

## 2015-04-01 ENCOUNTER — Encounter: Payer: Self-pay | Admitting: Family

## 2015-04-01 ENCOUNTER — Ambulatory Visit: Payer: Self-pay | Admitting: Family

## 2015-04-01 VITALS — BP 130/90 | HR 99 | Temp 98.6°F | Resp 16 | Ht 65.0 in | Wt 261.8 lb

## 2015-04-01 DIAGNOSIS — E785 Hyperlipidemia, unspecified: Secondary | ICD-10-CM

## 2015-04-01 DIAGNOSIS — M722 Plantar fascial fibromatosis: Secondary | ICD-10-CM | POA: Diagnosis not present

## 2015-04-01 DIAGNOSIS — Z23 Encounter for immunization: Secondary | ICD-10-CM | POA: Diagnosis not present

## 2015-04-01 DIAGNOSIS — I10 Essential (primary) hypertension: Secondary | ICD-10-CM | POA: Diagnosis not present

## 2015-04-01 DIAGNOSIS — E282 Polycystic ovarian syndrome: Secondary | ICD-10-CM

## 2015-04-01 LAB — BASIC METABOLIC PANEL
BUN: 7 mg/dL (ref 6–23)
CALCIUM: 9.5 mg/dL (ref 8.4–10.5)
CHLORIDE: 105 meq/L (ref 96–112)
CO2: 25 meq/L (ref 19–32)
CREATININE: 0.77 mg/dL (ref 0.40–1.20)
GFR: 109.2 mL/min (ref >60.00)
Glucose, Bld: 86 mg/dL (ref 70–99)
Potassium: 3.5 mEq/L (ref 3.5–5.1)
Sodium: 139 mEq/L (ref 135–145)

## 2015-04-01 LAB — LIPID PANEL
CHOL/HDL RATIO: 4
Cholesterol: 225 mg/dL — ABNORMAL HIGH (ref 0–200)
HDL: 54.9 mg/dL (ref >39.00)
LDL CALC: 160 mg/dL — AB (ref 0–99)
NonHDL: 169.91
Triglycerides: 51 mg/dL (ref 0.0–149.0)
VLDL: 10.2 mg/dL (ref 0.0–40.0)

## 2015-04-01 MED ORDER — ESCITALOPRAM OXALATE 20 MG PO TABS: 20.0000 mg | ORAL_TABLET | Freq: Every day | ORAL | Status: DC

## 2015-04-01 MED ORDER — MELOXICAM 7.5 MG PO TABS: 7.5000 mg | ORAL_TABLET | Freq: Every day | ORAL | Status: DC | PRN

## 2015-04-01 MED ORDER — METOPROLOL SUCCINATE ER 50 MG PO TB24: ORAL_TABLET | ORAL | Status: DC

## 2015-04-01 MED ORDER — NORETHINDRONE ACET-ETHINYL EST 1-20 MG-MCG PO TABS: 1.0000 | ORAL_TABLET | Freq: Every day | ORAL | Status: DC

## 2015-04-01 MED ORDER — SPIRONOLACTONE 50 MG PO TABS: 25.0000 mg | ORAL_TABLET | Freq: Two times a day (BID) | ORAL | Status: DC

## 2015-04-01 NOTE — Assessment & Plan Note (Signed)
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  Do not restart K+ due to risk of hyperkalemia, obtain bmet today.

## 2015-04-01 NOTE — Assessment & Plan Note (Signed)
Obtain follow up lipid panel.  

## 2015-04-01 NOTE — Progress Notes (Signed)
Subjective:    Patient ID: Tina Figueroa, female    DOB: [birth year], 34 y.o.   MRN: [id number]  HPI  Tina Figueroa is a 34 yr old female who presents today for follow up.  1) HTN- on toprol xl- ran out of meds 2 months ago.  BP Readings from Last 3 Encounters:  04/01/15 130/90  08/27/14 108/70  07/30/14 129/84   2) Hyperlipidemia- Not on statin.   Lab Results  Component Value Date   CHOL 219* 08/27/2014   HDL 45.20 08/27/2014   LDLCALC 162* 08/27/2014   TRIG 60.0 08/27/2014   CHOLHDL 5 08/27/2014   3) Depression- currently maintained on lexapro 20mg . She is maintained lexapro- managed to keep this rx going despite brief loss of insurance.    Requests refill of meloxicam prn.  For bilateral foot pain, "worse with this weather change."  4) PCOS- notes recent worsening of her hirsuitism and her new insurance will not cover eflornithine cream. Has to shave every day.   Review of Systems See HPI  Past Medical History  Diagnosis Date  . PCOS (polycystic ovarian syndrome)   . History of chicken pox   . Elevated blood pressure reading without diagnosis of hypertension   . Palpitations   . Mild hyperlipidemia 03/18/2012  . OSA (obstructive sleep apnea) 03/25/2012    Severe OSA per sleep study 10/13.   Marland Kitchen. Hypertension     Social History   Social History  . Marital Status: Single    Spouse Name: N/A  . Number of Children: 0  . Years of Education: N/A   Occupational History  . Government social research officerffice Assistant    Social History Main Topics  . Smoking status: Never Smoker   . Smokeless tobacco: Never Used  . Alcohol Use: Yes     Comment: Rarely  . Drug Use: No  . Sexual Activity: Not on file   Other Topics Concern  . 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 Tonsillectomy    . Wisdom tooth extraction    . Dilation and curettage of uterus    . Adenoidectomy  1989    Family History  Problem Relation Age of Onset  . Heart disease Mother     CHF  . Hypertension Mother   . Depression Mother   . Arthritis Father   . Diabetes Maternal Aunt   . Arthritis Maternal Uncle   . Diabetes Paternal Uncle   . Arthritis Maternal Grandmother   . Cancer Maternal Grandmother     lung  . Arthritis Paternal Grandmother   . Cancer Paternal Grandmother     breast  . Stroke Paternal Grandmother   . Diabetes Paternal Grandmother   . Cancer Paternal Grandfather     colon and prostate  . Cancer Other     aunt  . Asthma Mother   . Polycystic ovary syndrome Mother     Allergies  Allergen Reactions  . Flagyl [Metronidazole Hcl]     Chalky and coated tongue    Current Outpatient Prescriptions on File Prior to Visit  Medication Sig Dispense Refill  .  Multiple Vitamin (MULTIVITAMIN) tablet Take 1 tablet by mouth daily.    . fluticasone (FLONASE) 50 MCG/ACT nasal spray Place 2 sprays into the nose as needed.     No current facility-administered medications on file prior to visit.    BP 130/90 mmHg  Pulse 99  Temp(Src) 98.6 F (37 C) (Oral)  Resp 16  Ht  (1.651 m)  Wt 261 lb 12.8 oz (118.752 kg)  BMI 43.57 kg/m2  SpO2 99%  LMP 02/26/2015       Objective:   Physical Exam  Constitutional: She is oriented to person, place, and time. She appears well-developed and well-nourished.  HENT:  Head: Normocephalic and atraumatic.  Eyes: No scleral icterus.  Cardiovascular: Normal rate, regular rhythm and normal heart sounds.   No murmur heard. Pulmonary/Chest: Effort normal and breath sounds normal. No respiratory distress. She has no wheezes.  Musculoskeletal: She exhibits no edema.  Neurological: She is alert and oriented to person, place, and time.  Skin:  + facial hirsuitism noted  Psychiatric:  She has a normal mood and affect. Her behavior is normal. Judgment and thought content normal.          Assessment & Plan:

## 2015-04-01 NOTE — Progress Notes (Signed)
Pre visit review using our clinic review tool, if applicable. No additional management support is needed unless otherwise documented below in the visit note. 

## 2015-04-01 NOTE — Patient Instructions (Signed)
Add aldactone 1/2 tab twice daily. Do not restart potassium supplement.  Restart your toprol xl.   Complete alb work prior to leaving.

## 2015-04-01 NOTE — Assessment & Plan Note (Signed)
BP up to daty, resume toprol xl.

## 2015-04-01 NOTE — Assessment & Plan Note (Signed)
Resume meloxicam on a prn basis.

## 2015-04-02 ENCOUNTER — Encounter: Payer: Self-pay | Admitting: Family

## 2015-04-15 ENCOUNTER — Ambulatory Visit (INDEPENDENT_AMBULATORY_CARE_PROVIDER_SITE_OTHER): Payer: PRIVATE HEALTH INSURANCE | Admitting: Family

## 2015-04-15 ENCOUNTER — Encounter: Payer: Self-pay | Admitting: Family

## 2015-04-15 VITALS — BP 128/82 | HR 81 | Temp 98.2°F | Resp 16 | Ht 65.0 in | Wt 262.0 lb

## 2015-04-15 DIAGNOSIS — N926 Irregular menstruation, unspecified: Secondary | ICD-10-CM | POA: Diagnosis not present

## 2015-04-15 LAB — BASIC METABOLIC PANEL
BUN: 11 mg/dL (ref 6–23)
CHLORIDE: 105 meq/L (ref 96–112)
CO2: 26 mEq/L (ref 19–32)
CREATININE: 0.82 mg/dL (ref 0.40–1.20)
Calcium: 9.9 mg/dL (ref 8.4–10.5)
GFR: 101.53 mL/min (ref >60.00)
Glucose, Bld: 80 mg/dL (ref 70–99)
POTASSIUM: 4.5 meq/L (ref 3.5–5.1)
Sodium: 139 mEq/L (ref 135–145)

## 2015-04-15 MED ORDER — BUPROPION HCL 75 MG PO TABS: ORAL_TABLET | ORAL | Status: DC

## 2015-04-15 NOTE — Progress Notes (Signed)
Pre visit review using our clinic review tool, if applicable. No additional management support is needed unless otherwise documented below in the visit note. 

## 2015-04-15 NOTE — Progress Notes (Signed)
Subjective:    Patient ID: Tina Figueroa, female    DOB: 1981-04-04, 35 y.o.   MRN: [id number]  HPI  Tina Figueroa is a 34 yr old female who presents today for follow up.  PCOS/Hirsuitism- last visit aldactone was added. Pt notes menstrual bleeding x 8 days since starting aldactone.   HTN- BP was up last visit and pt was instructed to resume aldactone.   BP Readings from Last 3 Encounters:  04/15/15 128/82  04/01/15 130/90  08/27/14 108/70     Review of Systems    see HPI  Past Medical History  Diagnosis Date  . PCOS (polycystic ovarian syndrome)   . History of chicken pox   . Elevated blood pressure reading without diagnosis of hypertension   . Palpitations   . Mild hyperlipidemia 03/18/2012  . OSA (obstructive sleep apnea) 03/25/2012    Severe OSA per sleep study 10/13.   Marland Kitchen Hypertension     Social History   Social History  . Marital Status: Single    Spouse Name: N/A  . Number of Children: 0  . Years of Education: N/A   Occupational History  . Government social research officer    Social History Main Topics  . Smoking status: Never Smoker   . Smokeless tobacco: Never Used  . Alcohol Use: Yes     Comment: Rarely  . Drug Use: No  . Sexual Activity: Not on file   Other Topics Concern  . 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 Tonsillectomy    . Wisdom tooth extraction    . Dilation and curettage of uterus    . Adenoidectomy  1989    Family History  Problem Relation Age of Onset  . Heart disease Mother     CHF  . Hypertension Mother   . Depression Mother   . Arthritis Father   . Diabetes Maternal Aunt   . Arthritis Maternal Uncle   . Diabetes Paternal Uncle   . Arthritis Maternal Grandmother   . Cancer  Maternal Grandmother     lung  . Arthritis Paternal Grandmother   . Cancer Paternal Grandmother     breast  . Stroke Paternal Grandmother   . Diabetes Paternal Grandmother   . Cancer Paternal Grandfather     colon and prostate  . Cancer Other     aunt  . Asthma Mother   . Polycystic ovary syndrome Mother     Allergies  Allergen Reactions  . Flagyl [Metronidazole Hcl]     Chalky and coated tongue    Current Outpatient Prescriptions on File Prior to Visit  Medication Sig Dispense Refill  . escitalopram (LEXAPRO) 20 MG tablet Take 1 tablet (20 mg total) by mouth daily. 90 tablet 1  . meloxicam (MOBIC) 7.5 MG tablet Take 1 tablet (7.5 mg total) by mouth daily as needed for pain. 30 tablet 1  . metoprolol succinate (TOPROL-XL) 50 MG 24 hr tablet TAKE 1 1/2 TABLETS BY MOUTH EVERY DAY 135 tablet 1  . Multiple Vitamin (MULTIVITAMIN) tablet Take 1 tablet by mouth daily.    . norethindrone-ethinyl estradiol (MICROGESTIN)  1-20 MG-MCG tablet Take 1 tablet by mouth daily. 63 tablet 5  . spironolactone (ALDACTONE) 50 MG tablet Take 0.5 tablets (25 mg total) by mouth 2 (two) times daily. 30 tablet 2  . fluticasone (FLONASE) 50 MCG/ACT nasal spray Place 2 sprays into the nose as needed.     No current facility-administered medications on file prior to visit.    BP 128/82 mmHg  Pulse 81  Temp(Src) 98.2 F (36.8 C) (Oral)  Resp 16  Ht 5\' 5"  (1.651 m)  Wt 262 lb (118.842 kg)  BMI 43.60 kg/m2  SpO2 99%  LMP 04/07/2015    Objective:   Physical Exam  Constitutional: She is oriented to person, place, and time. She appears well-developed and well-nourished.  HENT:  Head: Normocephalic and atraumatic.  Cardiovascular: Normal rate, regular rhythm and normal heart sounds.   No murmur heard. Pulmonary/Chest: Effort normal and breath sounds normal. No respiratory distress. She has no wheezes.  Musculoskeletal: She exhibits no edema.  Neurological: She is alert and oriented to person,  place, and time.  Psychiatric: She has a normal mood and affect. Her behavior is normal. Judgment and thought content normal.          Assessment & Plan:  Irregular menstrual bleeding- will obtain urine pregnancy test. Could be secondary to aldactone.  Advised pt ok to hold aldactone for a few weeks,  And if she wishes to retry in a few weeks she can.  Repeat bmet due to being on aldactone to assess K+

## 2015-04-15 NOTE — Patient Instructions (Signed)
Please complete lab work prior to leaving. Hold aldactone for a few weeks.  If bleeding stops and you wish to restart aldactone in a few weeks you can.

## 2015-04-16 LAB — PREGNANCY, URINE: PREG TEST UR: NEGATIVE

## 2015-04-17 ENCOUNTER — Encounter: Payer: Self-pay | Admitting: Family

## 2015-05-03 ENCOUNTER — Telehealth: Payer: Self-pay | Admitting: *Deleted

## 2015-05-03 MED ORDER — ESCITALOPRAM OXALATE 20 MG PO TABS: 20.0000 mg | ORAL_TABLET | Freq: Every day | ORAL | Status: DC

## 2015-05-03 MED ORDER — MELOXICAM 7.5 MG PO TABS: 7.5000 mg | ORAL_TABLET | Freq: Every day | ORAL | Status: DC | PRN

## 2015-05-03 MED ORDER — BUPROPION HCL 75 MG PO TABS: ORAL_TABLET | ORAL | Status: DC

## 2015-05-03 MED ORDER — METOPROLOL SUCCINATE ER 50 MG PO TB24: ORAL_TABLET | ORAL | Status: DC

## 2015-05-03 MED ORDER — NORETHINDRONE ACET-ETHINYL EST 1-20 MG-MCG PO TABS: 1.0000 | ORAL_TABLET | Freq: Every day | ORAL | Status: DC

## 2015-05-03 NOTE — Telephone Encounter (Signed)
Received faxes requesting new RXs of:  Bupropion, noerth/eth/ES, escitalopram, metoprolol and meloxicam be sent to [facility]. Confirmed with pt that she will be using [facility].  Rxs sent for above meds except meloxicam.  Is it ok to send #90 x 1 refill?

## 2015-05-03 NOTE — Telephone Encounter (Signed)
Rx sent for 90 day supple of mobic.  Please advise pt to start otc zantac 75mg  bid to protect her stomach against ulcers since she is using meloxicam regularly.

## 2015-07-18 ENCOUNTER — Ambulatory Visit (INDEPENDENT_AMBULATORY_CARE_PROVIDER_SITE_OTHER): Payer: PRIVATE HEALTH INSURANCE | Admitting: Family

## 2015-07-18 ENCOUNTER — Encounter: Payer: Self-pay | Admitting: Family

## 2015-07-18 VITALS — BP 144/72 | HR 93 | Temp 98.7°F | Resp 18 | Ht 65.0 in | Wt 271.0 lb

## 2015-07-18 DIAGNOSIS — E282 Polycystic ovarian syndrome: Secondary | ICD-10-CM

## 2015-07-18 DIAGNOSIS — G4733 Obstructive sleep apnea (adult) (pediatric): Secondary | ICD-10-CM

## 2015-07-18 DIAGNOSIS — I10 Essential (primary) hypertension: Secondary | ICD-10-CM | POA: Diagnosis not present

## 2015-07-18 DIAGNOSIS — F329 Major depressive disorder, single episode, unspecified: Secondary | ICD-10-CM | POA: Diagnosis not present

## 2015-07-18 DIAGNOSIS — F32A Depression, unspecified: Secondary | ICD-10-CM

## 2015-07-18 MED ORDER — METOPROLOL SUCCINATE ER 50 MG PO TB24: 100.0000 mg | ORAL_TABLET | Freq: Every day | ORAL | Status: DC

## 2015-07-18 NOTE — Assessment & Plan Note (Signed)
Has a new insurance, without CPAP, will initiate CPAP and refer back to Dr. Vassie Loll.

## 2015-07-18 NOTE — Progress Notes (Signed)
Subjective:    Patient ID: Tina Figueroa, female    DOB: Jul 13, 1980, 35 y.o.   MRN: [id number]  HPI  Tina Figueroa is a 35 yr old female who presents today for follow up.  1) HTN- She is currently on metoprolol  1.5 tabs once daily.   BP Readings from Last 3 Encounters:  07/18/15 144/72  04/15/15 128/82  04/01/15 130/90   2) Depression-maintained on wellbutrin.  Reports good mood on wellbutrin.   3) OSA-  Not using CPAP.  Having insurance issues.    4) Hirsuitism-  Initially had some irregular menstrual bleeding after starting aldactone. Reports that she stopped aldactone then restarted and had bleeding again.  She stopped  Aldactone completing.  She is looking into laser hair removal but believes it will be took costly.    Review of Systems See HPI      Past Medical History  Diagnosis Date  . PCOS (polycystic ovarian syndrome)   . History of chicken pox   . Elevated blood pressure reading without diagnosis of hypertension   . Palpitations   . Mild hyperlipidemia 03/18/2012  . OSA (obstructive sleep apnea) 03/25/2012    Severe OSA per sleep study 10/13.   Marland Kitchen Hypertension     Social History   Social History  . Marital Status: Single    Spouse Name: Tina Figueroa  . Number of Children: 0  . Years of Education: Tina Figueroa   Occupational History  . Government social research officer    Social History Main Topics  . Smoking status: Never Smoker   . Smokeless tobacco: Never Used  . Alcohol Use: Yes     Comment: Rarely  . Drug Use: No  . Sexual Activity: Not on file   Other Topics Concern  . 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 Tonsillectomy    . Wisdom tooth extraction    . Dilation and curettage of uterus    . Adenoidectomy   1989    Family History  Problem Relation Age of Onset  . Heart disease Mother     CHF  . Hypertension Mother   . Depression Mother   . Arthritis Father   . Diabetes Maternal Aunt   . Arthritis Maternal Uncle   . Diabetes Paternal Uncle   . Arthritis Maternal Grandmother   . Cancer Maternal Grandmother     lung  . Arthritis Paternal Grandmother   . Cancer Paternal Grandmother     breast  . Stroke Paternal Grandmother   . Diabetes Paternal Grandmother   . Cancer Paternal Grandfather     colon and prostate  . Cancer Other     aunt  . Asthma Mother   . Polycystic ovary syndrome Mother     Allergies  Allergen Reactions  . Flagyl [Metronidazole Hcl]     Chalky and coated tongue  . Spironolactone     Increased menstrual bleeding.    Current Outpatient Prescriptions on File Prior to Visit  Medication Sig Dispense Refill  . buPROPion (WELLBUTRIN) 75 MG tablet Take 1 tablet twice daily. 180 tablet 1  . escitalopram (LEXAPRO) 20  MG tablet Take 1 tablet (20 mg total) by mouth daily. 90 tablet 1  . meloxicam (MOBIC) 7.5 MG tablet Take 1 tablet (7.5 mg total) by mouth daily as needed for pain. 90 tablet 1  . metoprolol succinate (TOPROL-XL) 50 MG 24 hr tablet TAKE 1 1/2 TABLETS BY MOUTH EVERY DAY 135 tablet 1  . Multiple Vitamin (MULTIVITAMIN) tablet Take 1 tablet by mouth daily.    . norethindrone-ethinyl estradiol (MICROGESTIN) 1-20 MG-MCG tablet Take 1 tablet by mouth daily. 63 tablet 1  . fluticasone (FLONASE) 50 MCG/ACT nasal spray Place 2 sprays into the nose as needed.     No current facility-administered medications on file prior to visit.    BP 144/72 mmHg  Pulse 93  Temp(Src) 98.7 F (37.1 C) (Oral)  Resp 18  Ht  (1.651 m)  Wt 271 lb (122.925 kg)  BMI 45.10 kg/m2  SpO2 100%  LMP 07/11/2015    Objective:   Physical Exam  Constitutional: She is oriented to person, place, and time. She appears well-developed and well-nourished.  HENT:  Head:  Normocephalic and atraumatic.  Cardiovascular: Normal rate, regular rhythm and normal heart sounds.   No murmur heard. Pulmonary/Chest: Effort normal and breath sounds normal. No respiratory distress. She has no wheezes.  Neurological: She is alert and oriented to person, place, and time.  Skin: Skin is warm and dry.  + Hirsuitism (Face/neck)  Psychiatric: She has a normal mood and affect. Her behavior is normal. Judgment and thought content normal.          Assessment & Plan:

## 2015-07-18 NOTE — Assessment & Plan Note (Signed)
Stable on wellbutrin, continue same.  

## 2015-07-18 NOTE — Patient Instructions (Signed)
You will be contacted about your referral for home CPAP and to see Dr. Alva for your sleep apnea. Increase metoprolol  to Vassie Loll tabs once daily.

## 2015-07-18 NOTE — Assessment & Plan Note (Signed)
+   hirsuitism, she is looking into laser hair removal because she did not tolerate aldactone.

## 2015-07-18 NOTE — Assessment & Plan Note (Signed)
BP mildly elevated.  Increase metoprolol from  to  once daily.

## 2015-07-19 ENCOUNTER — Encounter: Payer: Self-pay | Admitting: Family

## 2015-08-12 ENCOUNTER — Other Ambulatory Visit: Payer: Self-pay | Admitting: Family

## 2015-08-15 ENCOUNTER — Ambulatory Visit (INDEPENDENT_AMBULATORY_CARE_PROVIDER_SITE_OTHER): Payer: PRIVATE HEALTH INSURANCE | Admitting: Family

## 2015-08-15 ENCOUNTER — Encounter: Payer: Self-pay | Admitting: Family

## 2015-08-15 VITALS — BP 126/78 | HR 92 | Temp 98.2°F | Resp 16 | Ht 65.0 in | Wt 273.0 lb

## 2015-08-15 DIAGNOSIS — I10 Essential (primary) hypertension: Secondary | ICD-10-CM | POA: Diagnosis not present

## 2015-08-15 MED ORDER — METOPROLOL SUCCINATE ER 50 MG PO TB24: 100.0000 mg | ORAL_TABLET | Freq: Every day | ORAL | Status: DC

## 2015-08-15 NOTE — Patient Instructions (Signed)
Continue current dose of metoprolol.   

## 2015-08-15 NOTE — Progress Notes (Signed)
Subjective:    Patient ID: Tina Figueroa, female    DOB: 1980/10/17, 35 y.o.   MRN: [id number]  HPI  Tina Figueroa is a 35 yr old female who presents today for follow up of her blood pressure. She is currently maintained on toprol xl.  Last visit her metoprolol dose was increased from  to .    Review of Systems  Respiratory: Negative for shortness of breath.   Cardiovascular: Negative for chest pain and leg swelling.   Past Medical History  Diagnosis Date  . PCOS (polycystic ovarian syndrome)   . History of chicken pox   . Elevated blood pressure reading without diagnosis of hypertension   . Palpitations   . Mild hyperlipidemia 03/18/2012  . OSA (obstructive sleep apnea) 03/25/2012    Severe OSA per sleep study 10/13.   Marland Kitchen Hypertension     Social History   Social History  . Marital Status: Single    Spouse Name: N/A  . Number of Children: 0  . Years of Education: N/A   Occupational History  . Government social research officer    Social History Main Topics  . Smoking status: Never Smoker   . Smokeless tobacco: Never Used  . Alcohol Use: Yes     Comment: Rarely  . Drug Use: No  . Sexual Activity: Not on file   Other Topics Concern  . 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 Tonsillectomy    . Wisdom tooth extraction    . Dilation and curettage of uterus    . Adenoidectomy  1989    Family History  Problem Relation Age of Onset  . Heart disease Mother     CHF  . Hypertension Mother   . Depression Mother   . Arthritis Father   . Diabetes Maternal Aunt   . Arthritis Maternal Uncle   . Diabetes Paternal Uncle   . Arthritis Maternal Grandmother   . Cancer Maternal Grandmother     lung  . Arthritis Paternal  Grandmother   . Cancer Paternal Grandmother     breast  . Stroke Paternal Grandmother   . Diabetes Paternal Grandmother   . Cancer Paternal Grandfather     colon and prostate  . Cancer Other     aunt  . Asthma Mother   . Polycystic ovary syndrome Mother     Allergies  Allergen Reactions  . Flagyl [Metronidazole Hcl]     Chalky and coated tongue  . Spironolactone     Increased menstrual bleeding.    Current Outpatient Prescriptions on File Prior to Visit  Medication Sig Dispense Refill  . buPROPion (WELLBUTRIN) 75 MG tablet Take 1 tablet twice daily. 180 tablet 1  . escitalopram (LEXAPRO) 20 MG tablet Take 1 tablet (20 mg total) by mouth daily. 90 tablet 1  . metoprolol succinate (TOPROL-XL) 50 MG 24 hr tablet Take 2 tablets (100 mg total) by mouth daily. TAKE 1 1/2 TABLETS BY MOUTH EVERY DAY 135 tablet 1  . Multiple Vitamin (MULTIVITAMIN) tablet Take 1 tablet by mouth daily.    . norethindrone-ethinyl estradiol (MICROGESTIN,JUNEL,LOESTRIN) 1-20 MG-MCG tablet  TAKE 1 TABLET DAILY 63 tablet 1  . fluticasone (FLONASE) 50 MCG/ACT nasal spray Place 2 sprays into the nose as needed.     No current facility-administered medications on file prior to visit.    BP 112/82 mmHg  Pulse 92  Temp(Src) 98.2 F (36.8 C) (Oral)  Resp 16  Ht  (1.651 m)  Wt 273 lb (123.832 kg)  BMI 45.43 kg/m2  SpO2 98%  LMP 08/12/2015       exam   Physical Exam  Constitutional: She is oriented to person, place, and time. She appears well-developed and well-nourished.  HENT:  Head: Normocephalic and atraumatic.  Cardiovascular: Normal rate, regular rhythm and normal heart sounds.   No murmur heard. Pulmonary/Chest: Effort normal and breath sounds normal. No respiratory distress. She has no wheezes.  Neurological: She is alert and oriented to person, place, and time.  Psychiatric: She has a normal mood and affect. Her behavior is normal. Judgment and thought content normal.            Assessment & Plan:

## 2015-08-15 NOTE — Progress Notes (Signed)
Pre visit review using our clinic review tool, if applicable. No additional management support is needed unless otherwise documented below in the visit note. 

## 2015-08-15 NOTE — Assessment & Plan Note (Signed)
Improved.  Continue current dose of metoprolol.

## 2015-08-19 ENCOUNTER — Encounter: Payer: Self-pay | Admitting: Family

## 2015-08-19 ENCOUNTER — Encounter: Payer: PRIVATE HEALTH INSURANCE | Attending: Family | Admitting: *Deleted

## 2015-08-19 ENCOUNTER — Encounter: Payer: Self-pay | Admitting: *Deleted

## 2015-08-19 DIAGNOSIS — E282 Polycystic ovarian syndrome: Secondary | ICD-10-CM

## 2015-08-19 DIAGNOSIS — E669 Obesity, unspecified: Secondary | ICD-10-CM

## 2015-08-19 NOTE — Progress Notes (Signed)
  Medical Nutrition Therapy:  Appt start time: 0900 end time:  1000.  Assessment:  Primary concerns today: Tina Figueroa is here for nutrition counseling.  There is a family history of heart disease and PCOS.  Tina Figueroa herself also has PCOS.  She is concerned about her weight and her health.  Has HTN, but is controlled with medication and has history of hyperlipidemia, but is controlled now.  Is aware that her PCOS makes her more at risk for heart disease and diabetes.  Made some dietary changes in the past by reducing butter and switched to vegetable spread.  Has been eating more fruits and vegetables, but feels she is still gaining weight and is frustrated  Lives with fiance.  Share grocery shopping and cooking responsibilities.  When at home eats in living room or bedroom and not at table.  Feels she is a slow to medium paced eater. Is not being treated for her PCOS.  States she's allergic to spironolactone.  Is taking birth control pills, but not Metformin.  Has some misinformation about PCOS Feels she might be lactose intolerant  Preferred Learning Style:   No preference indicated   Learning Readiness:  Ready/Change in progress   MEDICATIONS: see list   DIETARY INTAKE:  Usual eating pattern includes 3 meals and 1 snacks per day.  Everyday foods include proteins, straches.  Avoided foods include none.    24-hr recall:  B ( AM): oatmeal with brow sugar and cinnamon and butter, cranberry juice  Snk ( AM): string cheese and almonds with water  L ( PM): water, Mal[place]urkey sandwich on ww bread and mayo Snk ( PM): none D ( PM): fried drumsticks and yellow rice with chickpeas Snk ( PM): none Beverages: water, tea, juice  Usual physical activity: none   Nutritional Diagnosis:  NB-1.1 Food and nutrition-related knowledge deficit As related to proper balance of fats, carbohydrates, and proteins.  As evidenced by dietary recall.    Intervention:  Nutrition counseling provided.  Discussed very  briefly treatment for PCOS and suggested talking with PCP about treatment options. Discussed MyPlate recommendations for meal planning, focusing on increasing fiber-rich foods and balancing with protein.  Recommended eating adequately and not restricting certain types of foods as that leads to binge-eating behaviors.  Discussed importance of variety in her diet and eating without restriction/diet mentality.  Recommended adequate hydration and mindful eating at the table without distractions. Recommended adequate physical activity.  She is considering joining [employer] with friends.  Suggested working out with friends, when able, but also on her own to stay consistent.  Recommended 30 minutes moderate intensity most days of the week.  Suggested weight training, when able  Teaching Method Utilized:  Visual Auditory   Handouts given during visit include:  MyPlate  Snack handout  Barriers to learning/adherence to lifestyle change: none  Demonstrated degree of understanding via:  Teach Back   Monitoring/Evaluation:  Dietary intake, exercise, and body weight in 6 week(s) to discuss PCOS more.

## 2015-09-15 ENCOUNTER — Encounter: Payer: Self-pay | Admitting: *Deleted

## 2015-09-15 ENCOUNTER — Encounter: Payer: PRIVATE HEALTH INSURANCE | Admitting: *Deleted

## 2015-09-15 DIAGNOSIS — E669 Obesity, unspecified: Secondary | ICD-10-CM

## 2015-09-15 DIAGNOSIS — E282 Polycystic ovarian syndrome: Secondary | ICD-10-CM

## 2015-09-15 NOTE — Progress Notes (Signed)
  Medical Nutrition Therapy:  Appt start time: 0800 end time:  0900.  Assessment:  Primary concerns today: Tina Figueroa is here for follow up nutrition counseling.  There is a family history of heart disease and PCOS.  Tina Figueroa herself also has PCOS and is interested in learning more about management. She has an appointment with endocrinology in 10/11/15 to discuss medication management.    Preferred Learning Style:   No preference indicated   Learning Readiness:  Ready/Change in progress   MEDICATIONS: see list   DIETARY INTAKE: Usual eating pattern includes 3 meals and 1-2 snacks per day.  Everyday foods include proteins, straches.  Avoided foods include none.    24-hr recall:  B: [place]key bacon, scrambled eggs, and orange, water S: almonds string cheese L: salad with rotisserie chicken salad with cheese and assorted veggies, ranch; water S: pinepapple D: thin crust veggie pizza, 1/2 tea  Usual physical activity: walking or other outside activity daily   Nutritional Diagnosis:  NB-1.1 Food and nutrition-related knowledge deficit As related to proper balance of fats, carbohydrates, and proteins.  As evidenced by dietary recall.    Intervention:  Nutrition counseling provided.   Discussed physiology of carbohydrate metabolism and how it is affected by PCOS.  Discussed hormonal imbalances associated with PCOS and how those imbalances present themselves with hirsutism, body acne, menstrual irregularity, obesity, and poor glycemic control.  Dicussed possible increased risk for CVD and the importance of nutrition management for overall health.   Recommended the Mediterranean style eating plan: MUFAs, whole grains, fruits, vegetables, legumes, lean proteins, and low-fat dairy.  Recommended limiting refined carbohydrates and concentrated sweets in favor of low-glycemic index foods.  Suggested regularly scheduled meals and snacks and to avoid meal skipping.  Recommended fiber and lean protein with  all meals and to include non-starchy vegetables with most meals.    Recommended regular physical activity of 150 minutes/week.  Discussed reading food labels: focusing on fiber and limited sugars and saturated, trans fats.   Teaching Method Utilized:  Visual Auditory   Handouts given during visit include:  Mediterranean diet  [employer] handout  Barriers to learning/adherence to lifestyle change: none  Demonstrated degree of understanding via:  Teach Back   Monitoring/Evaluation:  Dietary intake, exercise, and body weight in 2 month(s)

## 2015-09-22 ENCOUNTER — Encounter: Payer: Self-pay | Admitting: Family

## 2015-09-29 ENCOUNTER — Institutional Professional Consult (permissible substitution): Payer: PRIVATE HEALTH INSURANCE | Admitting: Pulmonary Disease

## 2015-09-29 ENCOUNTER — Encounter: Payer: Self-pay | Admitting: Family

## 2015-09-29 DIAGNOSIS — N924 Excessive bleeding in the premenopausal period: Secondary | ICD-10-CM

## 2015-09-29 DIAGNOSIS — E282 Polycystic ovarian syndrome: Secondary | ICD-10-CM

## 2015-10-03 NOTE — Addendum Note (Signed)
Addended by: Sandford Craze'SULLIVAN, Quincey Quesinberry on: 10/03/2015 03:46 PM   Modules accepted: Orders

## 2015-10-10 ENCOUNTER — Encounter: Payer: Self-pay | Admitting: Obstetrics & Gynecology

## 2015-10-10 ENCOUNTER — Ambulatory Visit (INDEPENDENT_AMBULATORY_CARE_PROVIDER_SITE_OTHER): Payer: PRIVATE HEALTH INSURANCE | Admitting: Obstetrics & Gynecology

## 2015-10-10 VITALS — BP 147/90 | HR 107 | Ht 64.5 in | Wt 256.0 lb

## 2015-10-10 DIAGNOSIS — Z113 Encounter for screening for infections with a predominantly sexual mode of transmission: Secondary | ICD-10-CM

## 2015-10-10 DIAGNOSIS — N939 Abnormal uterine and vaginal bleeding, unspecified: Secondary | ICD-10-CM | POA: Diagnosis not present

## 2015-10-10 DIAGNOSIS — I10 Essential (primary) hypertension: Secondary | ICD-10-CM | POA: Diagnosis not present

## 2015-10-10 MED ORDER — NORGESTREL-ETHINYL ESTRADIOL 0.3-30 MG-MCG PO TABS: 1.0000 | ORAL_TABLET | Freq: Every day | ORAL | Status: DC

## 2015-10-10 MED ORDER — MEGESTROL ACETATE 40 MG PO TABS: 40.0000 mg | ORAL_TABLET | Freq: Two times a day (BID) | ORAL | Status: DC

## 2015-10-10 NOTE — Patient Instructions (Signed)

## 2015-10-10 NOTE — Addendum Note (Signed)
Addended by: Anell BarrHOWARD, JENNIFER L on: 10/10/2015 11:33 AM   Modules accepted: Orders

## 2015-10-10 NOTE — Progress Notes (Signed)
Patient ID: Tina Figueroa, female   DOB: 1980/10/14, 35 y.o.   MRN: [id number] History:  35 y.o. G0P0000 here today for pt c/o bleeding beginning off and on from the 4th of April. Pt reports that she is passing clots.  Had similar issus in 2012.Has been wearing super tampons and passing palm sized clots. Pt dx'd with PCOS in 1990's.  Pt in lost dose OCP's.  Currently sexually active. Monogamous for 3 years.  Pt denies abnormal discharge. No pain.  Prior to April pt was having cycles q 28 days for 2-3 days with occ clot.  Today the bleeding is less but, with small clots still.  Pt has prev had an endo bx and D&C for eval and management of the bleeding.   Last PAP last year.  05/2015  The following portions of the patient's history were reviewed and updated as appropriate: allergies, current medications, past family history, past medical history, past social history, past surgical history and problem list.  Review of Systems:  Pt is scheduled to see the endocrinologist, nutritionist and her promary care provider in the near future   Objective:  Physical Exam Blood pressure 150/80, pulse 98, height 5' 4.5" (1.638 m), weight 256 lb (116.121 kg), last menstrual period 09/21/2015.  Repeat BP- still elevated 147/90 Gen: NAD Abd: Soft, nontender and nondistended. Obese Pelvic: Normal appearing external genitalia; normal appearing vaginal mucosa and cervix.  Normal discharge.  Small uterus, no other palpable masses, no uterine or adnexal tenderness  Labs and Imaging No results found.  Assessment & Plan:  AUB- suspect due to ongoing PCOS.  Pt on low dose OCPs.  Will try to stop bleeding with Megace x 1 month and then cycle on OCP's at a higher dosage.  BO elevated. Pt with chronic HTN- seeing her primary care procvider tomorrow  Megace 40mg  bid x 1 month LoOvral 1 po q day F/u in 3-4 months Obtain records from Dr[facility]  Tina Figueroa, M.D., Evern CoreFACOG

## 2015-10-11 ENCOUNTER — Encounter: Payer: Self-pay | Admitting: Family

## 2015-10-11 ENCOUNTER — Ambulatory Visit (INDEPENDENT_AMBULATORY_CARE_PROVIDER_SITE_OTHER): Payer: PRIVATE HEALTH INSURANCE | Admitting: Internal Medicine

## 2015-10-11 ENCOUNTER — Encounter: Payer: Self-pay | Admitting: Internal Medicine

## 2015-10-11 VITALS — BP 130/68 | HR 89 | Temp 98.0°F | Resp 12 | Ht 64.5 in | Wt 277.0 lb

## 2015-10-11 DIAGNOSIS — E282 Polycystic ovarian syndrome: Secondary | ICD-10-CM

## 2015-10-11 DIAGNOSIS — E559 Vitamin D deficiency, unspecified: Secondary | ICD-10-CM

## 2015-10-11 LAB — T3, FREE: T3 FREE: 4 pg/mL (ref 2.3–4.2)

## 2015-10-11 LAB — TSH: TSH: 2.6 u[IU]/mL (ref 0.35–4.50)

## 2015-10-11 LAB — T4, FREE: Free T4: 0.93 ng/dL (ref 0.60–1.60)

## 2015-10-11 LAB — GC/CHLAMYDIA PROBE AMP (~~LOC~~) NOT AT ARMC
CHLAMYDIA, DNA PROBE: NEGATIVE
Neisseria Gonorrhea: NEGATIVE

## 2015-10-11 LAB — VITAMIN D 25 HYDROXY (VIT D DEFICIENCY, FRACTURES): VITD: 11.07 ng/mL — AB (ref 30.00–100.00)

## 2015-10-11 LAB — HEMOGLOBIN A1C: Hgb A1c MFr Bld: 4.6 % (ref 4.6–6.5)

## 2015-10-11 MED ORDER — METFORMIN HCL 500 MG PO TABS: 1000.0000 mg | ORAL_TABLET | Freq: Two times a day (BID) | ORAL | Status: DC

## 2015-10-11 NOTE — Patient Instructions (Signed)
Please start Metformin 500 mg with dinner x 4 days. If you tolerate this well, add another Metformin tablet (500 mg) with breakfast x 4 days. If you tolerate this well, add another metformin tablet with dinner (total 1000 mg) x 4 days. If you tolerate this well, add another metformin tablet with breakfast (total 1000 mg). Continue with 1000 mg of metformin 2x a day with breakfast and dinner.  Please continue the oral contraceptive.  Please stop at the lab.  Please consider the following ways to cut down carbs and fat and increase fiber and micronutrients in your diet: - substitute whole grain for white bread or pasta - substitute brown rice for white rice - substitute 90-calorie flat bread pieces for slices of bread when possible - substitute sweet potatoes or yams for white potatoes - substitute humus for margarine - substitute tofu for cheese when possible - substitute almond or rice milk for regular milk (would not drink soy milk daily due to concern for soy estrogen influence on breast cancer risk) - substitute dark chocolate for other sweets when possible - substitute water - can add lemon or orange slices for taste - for diet sodas (artificial sweeteners will trick your body that you can eat sweets without getting calories and will lead you to overeating and weight gain in the long run) - do not skip breakfast or other meals (this will slow down the metabolism and will result in more weight gain over time)  - can try smoothies made from fruit and almond/rice milk in am instead of regular breakfast - can also try old-fashioned (not instant) oatmeal made with almond/rice milk in am - order the dressing on the side when eating salad at a restaurant (pour less than half of the dressing on the salad) - eat as little meat as possible - can try juicing, but should not forget that juicing will get rid of the fiber, so would alternate with eating raw veg./fruits or drinking smoothies - use as  little oil as possible, even when using olive oil - can dress a salad with a mix of balsamic vinegar and lemon juice, for e.g. - use agave nectar, stevia sugar, or regular sugar rather than artificial sweateners - steam or broil/roast veggies  - snack on veggies/fruit/nuts (unsalted, preferably) when possible, rather than processed foods - reduce or eliminate aspartame in diet (it is in diet sodas, chewing gum, etc) Read the labels!  Try to read Dr. Katherina RightNeal Barnard's book: "Program for Reversing Diabetes" for other ideas for healthy eating.

## 2015-10-11 NOTE — Progress Notes (Signed)
Patient ID: Tina Figueroa, female   DOB: 1981/05/27, 35 y.o.   MRN: [id number]  HPI: Tina Figueroa is a 35 y.o. female, referred by her PCP, [name]., NP for evaluation for PCOS.  She was dx'ed with PCOS in 1994 by blood work, by Dr.  in [facility] remember name).   She was seeing Dr. Cherly Hensen in the past, now Dr. Willodean Rosenthal.   Fertility/Menstrual cycles: - irregular menses until she started OCPs in 2010 after a D&C - heavy menses >> just started Megace - no h/o ovarian cysts - children: 0 - miscarriages: 0 - contraception: OCPs  Acne: - no  Hirsutism: - upper lip and chin only  - initially waxing it, now shaving - electrolysis too expensive  Weight gain: - stable - no steroid use - no weight loss meds - Meals: - Breakfast: Scrambled eggs, [place] sausage, wheat toast and apple juice - Lunch: Chef salad - Dinner: Spaghetti with garlic bread - Snacks: Peanuts, nabs, yogurt, fruit  - Diets tried: Weight watchers >> frustrated as she lost weight, then gained; now introduced fruit and veggies - Exercise: no - started to walk during the weekends  She saw nutrition. She was recommended Ovasitol >> did not try it yet  Treatments tried: - tried Metformin 2010 >> nausea  - tried Spironolactone >> heavy bleeding - tried [place] in 2013 >> not working well, expensive - not on OCPs  Other meds: - SSRIs: Lexapro and Wellbutrin  - Last thyroid test normal: Lab Results  Component Value Date   TSH 2.59 08/27/2014   - Last set of lipids - high LDL    Component Value Date/Time   CHOL 225* 04/01/2015 1000   TRIG 51.0 04/01/2015 1000   HDL 54.90 04/01/2015 1000   CHOLHDL 4 04/01/2015 1000   VLDL 10.2 04/01/2015 1000   LDLCALC 160* 04/01/2015 1000   - Last HbA1c normal: Lab Results  Component Value Date   HGBA1C 4.8 04/19/2014   Patient also has a history of obesity,Hypertension, hyperlipidemia, and severe OSA per sleep study in 2013,  irregular heartbeat.  She has FH of PCOS and infertility in mother. She has FH of DM in PGM, maternal aunt, uncle.  ROS: Constitutional: No weight gain or loss, +  fatigue, no subjective hyperthermia/hypothermia+ Poor sleep  Eyes: no blurry vision, no xerophthalmia ENT: no sore throat, no nodules palpated in throat, no dysphagia/odynophagia, no hoarseness Cardiovascular: no CP/SOB/palpitations/leg swelling Respiratory: no cough/SOB Gastrointestinal: no N/V/D/C/+ Acid reflux  Musculoskeletal: no muscle/joint aches Skin:  No acne, + hair on face+ Easy bruising  Neurological: no tremors/numbness/tingling/dizziness Psychiatric:Plus both:n/anxiety  Past Medical History  Diagnosis Date  . PCOS (polycystic ovarian syndrome)   . History of chicken pox   . Elevated blood pressure reading without diagnosis of hypertension   . Palpitations   . Mild hyperlipidemia 03/18/2012  . OSA (obstructive sleep apnea) 03/25/2012    Severe OSA per sleep study 10/13.   Marland Kitchen Hypertension    Past Surgical History  Procedure Laterality Date  . Tonsillectomy    . Wisdom tooth extraction    . Dilation and curettage of uterus    . Adenoidectomy  1989   Social History   Social History  . Marital Status: Single    Spouse Name: N/A  . Number of Children: 0   Occupational History  Customer service representative     Social History Main Topics  . Smoking status: Never Smoker   . Smokeless tobacco:  Never Used  . Alcohol Use: Yes     Comment: Rarely  . Drug Use: No   Social History Narrative   Regular exercise;  No   Caffeine Use:  1-2 daily   Office assistant at [employer]   No children   Single   college   Current Outpatient Prescriptions on File Prior to Visit  Medication Sig Dispense Refill  . buPROPion (WELLBUTRIN) 75 MG tablet Take 1 tablet twice daily. 180 tablet 1  . escitalopram (LEXAPRO) 20 MG tablet Take 1 tablet (20 mg total) by mouth daily. 90 tablet 1  . fluticasone (FLONASE) 50  MCG/ACT nasal spray Place 2 sprays into the nose as needed.    . megestrol (MEGACE) 40 MG tablet Take 1 tablet (40 mg total) by mouth 2 (two) times daily. 60 tablet 0  . metoprolol succinate (TOPROL-XL) 50 MG 24 hr tablet Take 2 tablets (100 mg total) by mouth daily. 180 tablet 1  . Multiple Vitamin (MULTIVITAMIN) tablet Take 1 tablet by mouth daily.    Melene Muller. [START ON 11/09/2015] norgestrel-ethinyl estradiol (LO/OVRAL,CRYSELLE) 0.3-30 MG-MCG tablet Take 1 tablet by mouth daily. Begin after Megace completed 1 Package 11   No current facility-administered medications on file prior to visit.   Allergies  Allergen Reactions  . Flagyl [Metronidazole Hcl]     Chalky and coated tongue  . Spironolactone     Increased menstrual bleeding.   Family History  Problem Relation Age of Onset  . Heart disease Mother     CHF  . Hypertension Mother   . Depression Mother   . Arthritis Father   . Diabetes Maternal Aunt   . Arthritis Maternal Uncle   . Diabetes Paternal Uncle   . Arthritis Maternal Grandmother   . Cancer Maternal Grandmother     lung  . Arthritis Paternal Grandmother   . Cancer Paternal Grandmother     breast  . Stroke Paternal Grandmother   . Diabetes Paternal Grandmother   . Cancer Paternal Grandfather     colon and prostate  . Cancer Other     aunt  . Asthma Mother   . Polycystic ovary syndrome Mother    PE: BP 130/68 mmHg  Pulse 89  Temp(Src) 98 F (36.7 C) (Oral)  Resp 12  Ht 5' 4.5" (1.638 m)  Wt 277 lb (125.646 kg)  BMI 46.83 kg/m2  SpO2 98%  LMP 09/21/2015 Wt Readings from Last 3 Encounters:  10/11/15 277 lb (125.646 kg)  10/10/15 256 lb (116.121 kg)  08/15/15 273 lb (123.832 kg)   Constitutional: obese, in NAD, no full supraclavicular fat pads Eyes: PERRLA, EOMI, no exophthalmos ENT: moist mucous membranes, no thyromegaly, no cervical lymphadenopathy Cardiovascular: RRR, No MRG Respiratory: CTA B Gastrointestinal: abdomen soft, NT, ND,  BS+ Musculoskeletal: no deformities, strength intact in all 4 Skin: moist, warm; no acne on face, + dark terminal hair on chin, + vellum on sideburns, no skin tags, + mild acanthosis nigricans, no purple, wide, stretch marks Neurological: no tremor with outstretched hands, DTR normal in all 4  ASSESSMENT: 1. PCOS  PLAN: 1.  I had a long discussion with the patient about the fact that the PCOS is a misnomer, a patient does not necessarily have to have polycystic ovaries to be diagnosed with the disorder. This is of sum of several conditions, including:  weight gain  insulin resistance (and therefore a higher risk of developing diabetes later in life)  acne  hirsutism  irregular menstrual cycles  decreased fertility. - We also discussed about the fact that the treatment is usually targeted to addressing the problem that concerns the patient the most: acne/hirsutism, weight gain, or fertility, but there is no single treatment for PCOS.  - The first-line therapy are oral contraceptives. If she is concerned with her weight and other metabolic disturbances, we can use metformin; if she is concerned about acne/hirsutism, we can add spironolactone; and if she is concerned about fertility, I could refer her to reproductive endocrinology for possible use of clomiphene. - Patient tells me that she does not have any plans for pregnancy in the next year at least  - I recommended to start metformin for now to primarily help with weight, insulin resistance, menstrual cycles, and hopefully also with hirsutism. She agrees to try. She has tried regular metformin in the past and developed nausea, however, she remembers that the sample that she had was expired. She would like to try regular metformin first, and if she does not tolerate it, we can try the extended-release formulation. - She has try spironolactone, however, this is listed as an allergy for her, since she had increased vaginal bleeding while on  it.  - She was recommended Ovasitol by her nutrition is, however, she did not try to see it - We also discussed about the new studies with Resveratrol, and antioxidant, that showed decrease insulin resistance and testosterone levels. However, resveratrol was tried in diabetic patients, but  The effect was decreased by metformin. Since we are starting metformin, will hold off on resveratrol for now. - We also discussed about the importance of diet to improve her metabolic status and to decrease the risk of prediabetes, diabetes, hyperlipidemia, hypertension I strongly suggested a plant-based diet and given her references about this.  She did start to introduce more fruit and veggies in her diet. She also plans to start a more consistent exercise program. - Since I do not have previous investigation for PCOS, I would like to order the following tests today (I cannot check her estrogen and progesterone levels since she is on OCPs; will also check vitamin D as a deficiency of this was associated with more severe PCOS manifestations): Orders Placed This Encounter  Procedures  . Hemoglobin A1c  . 17-Hydroxyprogesterone  . Prolactin  . T3, free  . T4, free  . TSH  . Beta HCG, Quant  . Androstenedione  . Testosterone, Free, Total, SHBG  . DHEA-Sulfate, Serum  . VITAMIN D 25 Hydroxy (Vit-D Deficiency, Fractures)  - I will see the patient back in 6 months  - time spent with the patient: 1 hour, of which >50% was spent in obtaining information about her symptoms, reviewing her previous labs, evaluations, and treatments, counseling her about her condition (please see the discussed topics above), and developing a plan to further investigate and treat it; she had a number of questions which I addressed.  Component     Latest Ref Rng 10/11/2015  Testosterone     8 - 48 ng/dL 46  Testosterone Free     0.0 - 4.2 pg/mL 5.0 (H)  Sex Horm Binding Glob, Serum     24.6 - 122.0 nmol/L 187.1 (H)  TSH     0.35  - 4.50 uIU/mL 2.60  Hemoglobin A1C     4.6 - 6.5 % 4.6  Triiodothyronine,Free,Serum     2.3 - 4.2 pg/mL 4.0  T4,Free(Direct)     0.60 - 1.60 ng/dL 1.61  VITD  30.00 - 100.00 ng/mL 11.07 (L)   Great SHBG, with only slightly high testosterone! HbA1c excellent. TFTs normal. Vit D very low >> will start 5000 units vitamin D3 and recheck in 3 months.  Component     Latest Ref Rng 10/11/2015  17-OH-Progesterone, LC/MS/MS      <8  Prolactin      7.7  Beta hCG, Tumor Marker     < 5.0 mIU/mL < 2.0  Androstenedione      66  DHEA-Sulfate, LCMS      123  No hyperprolactinemia, no signs of CAH, she is not pregnant, and her and her androstenedione and DHEAS are normal.

## 2015-10-12 ENCOUNTER — Other Ambulatory Visit: Payer: Self-pay | Admitting: Family

## 2015-10-12 LAB — TESTOSTERONE, FREE, TOTAL, SHBG
Sex Hormone Binding: 187.1 nmol/L — ABNORMAL HIGH (ref 24.6–122.0)
Testosterone, Free: 5 pg/mL — ABNORMAL HIGH (ref 0.0–4.2)
Testosterone: 46 ng/dL (ref 8–48)

## 2015-10-12 LAB — PROLACTIN: PROLACTIN: 7.7 ng/mL

## 2015-10-12 NOTE — Telephone Encounter (Signed)
Patient called and made aware that she should take the Megace first and then start the birth control pills. Armandina StammerJennifer Howard RN BSN

## 2015-10-12 NOTE — Telephone Encounter (Signed)
-----   Message from Willodean Rosenthalarolyn Harraway-Smith, MD sent at 10/12/2015 11:27 AM EDT ----- Please call pt.  She sent in a question. She is to take the Megace first and then when that course is complete she should begin the OCP's  Thx, clh-S

## 2015-10-13 ENCOUNTER — Telehealth: Payer: Self-pay | Admitting: *Deleted

## 2015-10-13 ENCOUNTER — Encounter: Payer: Self-pay | Admitting: *Deleted

## 2015-10-13 NOTE — Telephone Encounter (Signed)
Pre-Visit Call completed with patient and chart updated.   Pre-Visit Info documented in Specialty Comments under SnapShot.    

## 2015-10-14 ENCOUNTER — Encounter: Payer: Self-pay | Admitting: Family

## 2015-10-14 ENCOUNTER — Encounter: Payer: PRIVATE HEALTH INSURANCE | Admitting: Family

## 2015-10-14 ENCOUNTER — Ambulatory Visit (INDEPENDENT_AMBULATORY_CARE_PROVIDER_SITE_OTHER): Payer: PRIVATE HEALTH INSURANCE | Admitting: Family

## 2015-10-14 VITALS — BP 120/82 | HR 84 | Temp 98.5°F | Resp 16 | Ht 64.25 in | Wt 279.2 lb

## 2015-10-14 DIAGNOSIS — Z0000 Encounter for general adult medical examination without abnormal findings: Secondary | ICD-10-CM

## 2015-10-14 LAB — CBC WITH DIFFERENTIAL/PLATELET
BASOS ABS: 0 10*3/uL (ref 0.0–0.1)
BASOS PCT: 0.4 % (ref 0.0–3.0)
Eosinophils Absolute: 0.2 10*3/uL (ref 0.0–0.7)
Eosinophils Relative: 2 % (ref 0.0–5.0)
HCT: 31.4 % — ABNORMAL LOW (ref 36.0–46.0)
HEMOGLOBIN: 10.6 g/dL — AB (ref 12.0–15.0)
LYMPHS PCT: 30.3 % (ref 12.0–46.0)
Lymphs Abs: 2.6 10*3/uL (ref 0.7–4.0)
MCHC: 33.7 g/dL (ref 30.0–36.0)
MCV: 90.9 fl (ref 78.0–100.0)
MONOS PCT: 5.5 % (ref 3.0–12.0)
Monocytes Absolute: 0.5 10*3/uL (ref 0.1–1.0)
NEUTROS ABS: 5.4 10*3/uL (ref 1.4–7.7)
Neutrophils Relative %: 61.8 % (ref 43.0–77.0)
PLATELETS: 352 10*3/uL (ref 150.0–400.0)
RBC: 3.45 Mil/uL — ABNORMAL LOW (ref 3.87–5.11)
RDW: 13.2 % (ref 11.5–15.5)
WBC: 8.7 10*3/uL (ref 4.0–10.5)

## 2015-10-14 LAB — DHEA-SULFATE, SERUM: DHEA: 123 ug/dL

## 2015-10-14 LAB — HEPATIC FUNCTION PANEL
ALBUMIN: 3.5 g/dL (ref 3.5–5.2)
ALK PHOS: 69 U/L (ref 39–117)
ALT: 8 U/L (ref 0–35)
AST: 10 U/L (ref 0–37)
Bilirubin, Direct: 0.1 mg/dL (ref 0.0–0.3)
TOTAL PROTEIN: 7 g/dL (ref 6.0–8.3)
Total Bilirubin: 0.4 mg/dL (ref 0.2–1.2)

## 2015-10-14 LAB — LIPID PANEL
CHOL/HDL RATIO: 4
Cholesterol: 181 mg/dL (ref 0–200)
HDL: 46 mg/dL (ref >39.00)
LDL Cholesterol: 124 mg/dL — ABNORMAL HIGH (ref 0–99)
NONHDL: 135.21
Triglycerides: 57 mg/dL (ref 0.0–149.0)
VLDL: 11.4 mg/dL (ref 0.0–40.0)

## 2015-10-14 LAB — BASIC METABOLIC PANEL
BUN: 8 mg/dL (ref 6–23)
CHLORIDE: 109 meq/L (ref 96–112)
CO2: 21 meq/L (ref 19–32)
Calcium: 8.8 mg/dL (ref 8.4–10.5)
Creatinine, Ser: 0.75 mg/dL (ref 0.40–1.20)
GFR: 112.23 mL/min (ref >60.00)
Glucose, Bld: 86 mg/dL (ref 70–99)
POTASSIUM: 3.8 meq/L (ref 3.5–5.1)
SODIUM: 137 meq/L (ref 135–145)

## 2015-10-14 LAB — URINALYSIS, ROUTINE W REFLEX MICROSCOPIC
BILIRUBIN URINE: NEGATIVE
KETONES UR: NEGATIVE
LEUKOCYTES UA: NEGATIVE
NITRITE: NEGATIVE
PH: 5.5 (ref 5.0–8.0)
Specific Gravity, Urine: >=1.03 — AB (ref 1.000–1.030)
TOTAL PROTEIN, URINE-UPE24: NEGATIVE
URINE GLUCOSE: NEGATIVE
UROBILINOGEN UA: 0.2 (ref 0.0–1.0)

## 2015-10-14 LAB — TSH: TSH: 2.82 u[IU]/mL (ref 0.35–4.50)

## 2015-10-14 LAB — BETA HCG QUANT (REF LAB)

## 2015-10-14 NOTE — Patient Instructions (Signed)
Please complete lab work prior to leaving.   

## 2015-10-14 NOTE — Progress Notes (Signed)
Pre visit review using our clinic review tool, if applicable. No additional management support is needed unless otherwise documented below in the visit note. 

## 2015-10-14 NOTE — Assessment & Plan Note (Signed)
Continue work with nutritionist.  Immunizations reviewed and up to date. Obtain routine lab work.

## 2015-10-14 NOTE — Progress Notes (Signed)
Subjective:    Patient ID: Tina Figueroa, female    DOB: 1981/02/17, 35 y.o.   MRN: [id number]  HPI  Patient presents today for complete physical.  Immunizations: tetanus up to date Diet: Exercise: Pap Smear: 1/16   Review of Systems  Constitutional: Negative for fatigue and unexpected weight change.  HENT: Negative for hearing loss.   Eyes: Negative for visual disturbance.  Respiratory: Negative for shortness of breath.        Mild cough due to allergies  Cardiovascular: Negative for chest pain, palpitations and leg swelling.  Gastrointestinal: Negative for diarrhea and constipation.  Genitourinary: Negative for dysuria and frequency.  Skin: Negative for rash.       Occasional dry patches  Neurological: Negative for numbness and headaches.  Psychiatric/Behavioral:       Denies depression/anxiety   Past Medical History  Diagnosis Date  . PCOS (polycystic ovarian syndrome)   . History of chicken pox   . Elevated blood pressure reading without diagnosis of hypertension   . Palpitations   . Mild hyperlipidemia 03/18/2012  . OSA (obstructive sleep apnea) 03/25/2012    Severe OSA per sleep study 10/13.   Marland Kitchen Hypertension      Social History   Social History  . Marital Status: Single    Spouse Name: N/A  . Number of Children: 0  . Years of Education: N/A   Occupational History  . Government social research officer    Social History Main Topics  . Smoking status: Never Smoker   . Smokeless tobacco: Never Used  . Alcohol Use: Yes     Comment: Rarely  . Drug Use: No  . Sexual Activity: Yes   Other Topics Concern  . 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 Tonsillectomy    . Wisdom tooth extraction    . Dilation  and curettage of uterus    . Adenoidectomy  1989    Family History  Problem Relation Age of Onset  . Heart disease Mother     CHF  . Hypertension Mother   . Depression Mother   . Asthma Mother   . Polycystic ovary syndrome Mother   . Arthritis Father   . Diabetes Maternal Aunt   . Arthritis Maternal Uncle   . Diabetes Paternal Uncle   . Arthritis Maternal Grandmother   . Cancer Maternal Grandmother     lung  . Arthritis Paternal Grandmother   . Cancer Paternal Grandmother     breast  . Stroke Paternal Grandmother   . Diabetes Paternal Grandmother   . Cancer Paternal Grandfather     colon and prostate  . Cancer Other     aunt  . Cancer Paternal Aunt 91    breast    Allergies  Allergen Reactions  . Flagyl [Metronidazole Hcl]     Chalky and coated tongue  . Spironolactone     Increased menstrual bleeding.    Current Outpatient Prescriptions on File Prior to Visit  Medication Sig Dispense Refill  . buPROPion (WELLBUTRIN) 75 MG tablet Take 1 tablet twice daily. 180 tablet 1  . escitalopram (LEXAPRO)  20 MG tablet Take 1 tablet (20 mg total) by mouth daily. 90 tablet 1  . fluticasone (FLONASE) 50 MCG/ACT nasal spray Place 2 sprays into the nose as needed.    . megestrol (MEGACE) 40 MG tablet Take 1 tablet (40 mg total) by mouth 2 (two) times daily. 60 tablet 0  . meloxicam (MOBIC) 7.5 MG tablet TAKE 1 TABLET DAILY AS NEEDED FOR PAIN 90 tablet 0  . metFORMIN (GLUCOPHAGE) 500 MG tablet Take 2 tablets (1,000 mg total) by mouth 2 (two) times daily with a meal. 120 tablet 5  . metoprolol succinate (TOPROL-XL) 50 MG 24 hr tablet Take 2 tablets (100 mg total) by mouth daily. 180 tablet 1  . Multiple Vitamin (MULTIVITAMIN) tablet Take 1 tablet by mouth daily.    . norethindrone-ethinyl estradiol (MICROGESTIN,JUNEL,LOESTRIN) 1-20 MG-MCG tablet     . [START ON 11/09/2015] norgestrel-ethinyl estradiol (LO/OVRAL,CRYSELLE) 0.3-30 MG-MCG tablet Take 1 tablet by mouth daily. Begin after  Megace completed (Patient not taking: Reported on 10/13/2015) 1 Package 11   No current facility-administered medications on file prior to visit.    BP 120/82 mmHg  Pulse 84  Temp(Src) 98.5 F (36.9 C) (Oral)  Resp 16  Ht 5' 4.25" (1.632 m)  Wt 279 lb 3.2 oz (126.644 kg)  BMI 47.55 kg/m2  SpO2 98%  LMP 09/21/2015       Objective:   Physical Exam  Physical Exam  Constitutional: She is oriented to person, place, and time. She appears well-developed and well-nourished. No distress.  HENT:  Head: Normocephalic and atraumatic.  Right Ear: Tympanic membrane and ear canal normal.  Left Ear: Tympanic membrane and ear canal normal.  Mouth/Throat: Oropharynx is clear and moist.  Eyes: Pupils are equal, round, and reactive to light. No scleral icterus.  Neck: Normal range of motion. No thyromegaly present.  Cardiovascular: Normal rate and regular rhythm.   No murmur heard. Pulmonary/Chest: Effort normal and breath sounds normal. No respiratory distress. He has no wheezes. She has no rales. She exhibits no tenderness.  Abdominal: Soft. Bowel sounds are normal. He exhibits no distension and no mass. There is no tenderness. There is no rebound and no guarding.  Musculoskeletal: She exhibits no edema.  Lymphadenopathy:    She has no cervical adenopathy.  Neurological: She is alert and oriented to person, place, and time. She has normal patellar reflexes. She exhibits normal muscle tone. Coordination normal.  Skin: Skin is warm and dry. + facial hirsuitism noted Psychiatric: She has a normal mood and affect. Her behavior is normal. Judgment and thought content normal.  Breasts: Examined lying Right: Without masses, retractions, discharge or axillary adenopathy.  Left: Without masses, retractions, discharge or axillary adenopathy.  Pelvic: deferred      Assessment & Plan:        Assessment & Plan:

## 2015-10-15 LAB — 17-HYDROXYPROGESTERONE: 17-OH-Progesterone, LC/MS/MS: <8 ng/dL

## 2015-10-15 LAB — ANDROSTENEDIONE: Androstenedione: 66 ng/dL

## 2015-10-18 ENCOUNTER — Telehealth: Payer: Self-pay | Admitting: *Deleted

## 2015-10-18 DIAGNOSIS — D649 Anemia, unspecified: Secondary | ICD-10-CM

## 2015-10-18 NOTE — Telephone Encounter (Signed)
Received call from lab that they are unable to add on iron, b12/folate. Pt has f/u in October. Do we need to bring her back for labs?

## 2015-10-19 NOTE — Telephone Encounter (Signed)
Yes please

## 2015-10-20 NOTE — Telephone Encounter (Signed)
Also, please let her know that she is anemic which is why we are ordering additional labs.  When she comes in lets also have her pick up IFOB please.

## 2015-10-20 NOTE — Telephone Encounter (Signed)
Discussed results with patient.  Pt states having heavy vaginal bleeding with "huge clots" from 09/19/15 to 10/12/15 due to changes in birth control.  Bleeding was so heavy that patient was having to change super tampon every hour.  Pt thinks this is the reason for low H/H.    Please advise.  Continue with lab and iFOB?

## 2015-10-20 NOTE — Telephone Encounter (Signed)
Yes please, just to be thorough.

## 2015-10-21 NOTE — Telephone Encounter (Signed)
Called patient. Lab appt scheduled, labs ordered, and iFOB kit placed up front.

## 2015-10-24 ENCOUNTER — Encounter: Payer: Self-pay | Admitting: Internal Medicine

## 2015-10-24 ENCOUNTER — Encounter: Payer: Self-pay | Admitting: *Deleted

## 2015-10-31 ENCOUNTER — Other Ambulatory Visit: Payer: PRIVATE HEALTH INSURANCE

## 2015-11-15 ENCOUNTER — Ambulatory Visit: Payer: PRIVATE HEALTH INSURANCE | Admitting: Family

## 2015-11-15 ENCOUNTER — Ambulatory Visit: Payer: PRIVATE HEALTH INSURANCE | Admitting: *Deleted

## 2015-12-19 ENCOUNTER — Encounter: Payer: Self-pay | Admitting: Obstetrics & Gynecology

## 2015-12-19 ENCOUNTER — Ambulatory Visit (INDEPENDENT_AMBULATORY_CARE_PROVIDER_SITE_OTHER): Payer: PRIVATE HEALTH INSURANCE | Admitting: Obstetrics & Gynecology

## 2015-12-19 VITALS — BP 123/82 | HR 114 | Resp 16 | Ht 64.5 in | Wt 281.0 lb

## 2015-12-19 DIAGNOSIS — N939 Abnormal uterine and vaginal bleeding, unspecified: Secondary | ICD-10-CM

## 2015-12-19 DIAGNOSIS — Z3041 Encounter for surveillance of contraceptive pills: Secondary | ICD-10-CM

## 2015-12-19 NOTE — Patient Instructions (Signed)

## 2015-12-19 NOTE — Progress Notes (Signed)
Patient ID: Tina Figueroa, female   Tina MillinB: [birth year], 35 y.o.   MRN: [id number] History:  35 y.o. G0P0000 here today for f/u of AUB.  Pt reports that she took the Megace for 2 weeks followed by the OCPs and she has no further bleeding.   The following portions of the patient's history were reviewed and updated as appropriate: allergies, current medications, past family history, past medical history, past social history, past surgical history and problem list.  Review of Systems:  Pertinent items are noted in HPI.  Objective:  Physical Exam Blood pressure 123/82, pulse 114, resp. rate 16, height 5' 4.5" (1.638 m), weight 281 lb (127.461 kg), last menstrual period 12/11/2015. Gen: NAD   Assessment & Plan:  AUB- sx improved with OCP's.  Pt offered lnIUD.  She wants to remain on OCPs at present.  Reviewed labs with pt. Keep LoOvral 1 po q day F/u in 1 year or sooner prn   Tina Figueroa L. Tina Figueroa, M.D., Evern CoreFACOG

## 2016-01-16 ENCOUNTER — Telehealth: Payer: Self-pay

## 2016-01-16 NOTE — Telephone Encounter (Signed)
[facility] called and given refill for birth control. Armandina Stammer RNBSN

## 2016-01-16 NOTE — Telephone Encounter (Signed)
-----   Message from Andria Meuse sent at 01/16/2016 10:33 AM EDT ----- Regarding: Rx refill Please contact pharmacy @ [phone] (case #[id number]) to renew low progesterone 28

## 2016-03-02 ENCOUNTER — Encounter: Payer: Self-pay | Admitting: Family

## 2016-04-09 ENCOUNTER — Encounter: Payer: Self-pay | Admitting: Family

## 2016-04-09 ENCOUNTER — Ambulatory Visit (INDEPENDENT_AMBULATORY_CARE_PROVIDER_SITE_OTHER): Payer: PRIVATE HEALTH INSURANCE | Admitting: Family

## 2016-04-09 ENCOUNTER — Telehealth: Payer: Self-pay | Admitting: Family

## 2016-04-09 ENCOUNTER — Other Ambulatory Visit: Payer: Self-pay | Admitting: Family

## 2016-04-09 VITALS — BP 137/89 | HR 88 | Temp 98.4°F | Resp 16 | Ht 64.5 in | Wt 280.0 lb

## 2016-04-09 DIAGNOSIS — I10 Essential (primary) hypertension: Secondary | ICD-10-CM

## 2016-04-09 DIAGNOSIS — E538 Deficiency of other specified B group vitamins: Secondary | ICD-10-CM

## 2016-04-09 DIAGNOSIS — R739 Hyperglycemia, unspecified: Secondary | ICD-10-CM

## 2016-04-09 DIAGNOSIS — D649 Anemia, unspecified: Secondary | ICD-10-CM | POA: Diagnosis not present

## 2016-04-09 DIAGNOSIS — F329 Major depressive disorder, single episode, unspecified: Secondary | ICD-10-CM | POA: Diagnosis not present

## 2016-04-09 DIAGNOSIS — G4733 Obstructive sleep apnea (adult) (pediatric): Secondary | ICD-10-CM | POA: Diagnosis not present

## 2016-04-09 DIAGNOSIS — Z23 Encounter for immunization: Secondary | ICD-10-CM | POA: Diagnosis not present

## 2016-04-09 DIAGNOSIS — F32A Depression, unspecified: Secondary | ICD-10-CM

## 2016-04-09 HISTORY — DX: Deficiency of other specified B group vitamins: E53.8

## 2016-04-09 LAB — BASIC METABOLIC PANEL
BUN: 7 mg/dL (ref 6–23)
CALCIUM: 9.3 mg/dL (ref 8.4–10.5)
CO2: 23 meq/L (ref 19–32)
Chloride: 108 mEq/L (ref 96–112)
Creatinine, Ser: 0.89 mg/dL (ref 0.40–1.20)
GFR: 91.86 mL/min (ref >60.00)
Glucose, Bld: 88 mg/dL (ref 70–99)
POTASSIUM: 3.7 meq/L (ref 3.5–5.1)
SODIUM: 138 meq/L (ref 135–145)

## 2016-04-09 LAB — CBC WITH DIFFERENTIAL/PLATELET
Basophils Absolute: 0 10*3/uL (ref 0.0–0.1)
Basophils Relative: 0.4 % (ref 0.0–3.0)
EOS ABS: 0.1 10*3/uL (ref 0.0–0.7)
EOS PCT: 1.6 % (ref 0.0–5.0)
HCT: 40.5 % (ref 36.0–46.0)
Hemoglobin: 13.5 g/dL (ref 12.0–15.0)
LYMPHS ABS: 2.2 10*3/uL (ref 0.7–4.0)
Lymphocytes Relative: 25.8 % (ref 12.0–46.0)
MCHC: 33.5 g/dL (ref 30.0–36.0)
MCV: 90.1 fl (ref 78.0–100.0)
MONO ABS: 0.4 10*3/uL (ref 0.1–1.0)
Monocytes Relative: 4.8 % (ref 3.0–12.0)
NEUTROS PCT: 67.4 % (ref 43.0–77.0)
Neutro Abs: 5.6 10*3/uL (ref 1.4–7.7)
Platelets: 259 10*3/uL (ref 150.0–400.0)
RBC: 4.49 Mil/uL (ref 3.87–5.11)
RDW: 14.1 % (ref 11.5–15.5)
WBC: 8.4 10*3/uL (ref 4.0–10.5)

## 2016-04-09 LAB — VITAMIN B12: Vitamin B-12: 152 pg/mL — ABNORMAL LOW (ref 211–911)

## 2016-04-09 LAB — FOLATE: FOLATE: 9.1 ng/mL (ref >5.9)

## 2016-04-09 LAB — IRON: IRON: 44 ug/dL (ref 42–145)

## 2016-04-09 MED ORDER — CYANOCOBALAMIN 1000 MCG/ML IJ SOLN: 1000.0000 ug | INTRAMUSCULAR | Status: AC
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
## 2016-04-09 NOTE — Patient Instructions (Signed)
Work on increasing your exercise and limiting your calories to 1500 a day.

## 2016-04-09 NOTE — Assessment & Plan Note (Signed)
Will order CPAP, home DME.  She is advised to follow up in 4-6 weeks after she begins CPAP.

## 2016-04-09 NOTE — Addendum Note (Signed)
Addended by: Arnette NorrisPAYNE, Kesia Dalto P on: 04/09/2016 04:54 PM   Modules accepted: Orders

## 2016-04-09 NOTE — Assessment & Plan Note (Signed)
Clinically stable, A1C was normal last visit.

## 2016-04-09 NOTE — Assessment & Plan Note (Signed)
BP stable on current medications, continue same. Obtain bmet.

## 2016-04-09 NOTE — Assessment & Plan Note (Signed)
Stable on wellbutrin, lexapro.  We did discuss that if her anxiety symptoms do not improve in the next few months while driving, that she may benefit from counseling.

## 2016-04-09 NOTE — Telephone Encounter (Signed)
Notified pt and she voices understanding. States she is not currently taking an iron supplement but is on a multivitamin.  Per verbal from PCP, ok to continue MVI. Pt is agreeable to start B12 injections. 1st four B12 injections scheduled.

## 2016-04-09 NOTE — Telephone Encounter (Signed)
Iron looks good. Blood count is improved. Please continue iron supplement. B12 is low. I would like her to begin b12 injections IM once weekly x 4 weeks, then once monthly.

## 2016-04-11 ENCOUNTER — Ambulatory Visit: Payer: PRIVATE HEALTH INSURANCE | Admitting: Internal Medicine

## 2016-04-13 ENCOUNTER — Ambulatory Visit (INDEPENDENT_AMBULATORY_CARE_PROVIDER_SITE_OTHER): Payer: PRIVATE HEALTH INSURANCE | Admitting: *Deleted

## 2016-04-13 DIAGNOSIS — E538 Deficiency of other specified B group vitamins: Secondary | ICD-10-CM | POA: Diagnosis not present

## 2016-04-13 MED ORDER — CYANOCOBALAMIN 1000 MCG/ML IJ SOLN
1000.0000 ug | Freq: Once | INTRAMUSCULAR | Status: AC
  Administered 2016-04-13: 1000 ug via INTRAMUSCULAR

## 2016-04-13 NOTE — Progress Notes (Signed)
Pre visit review using our clinic review tool, if applicable. No additional management support is needed unless otherwise documented below in the visit note.  Patient tolerated injection well.  Next appointment: 04/20/16  Consepcion Utt J Zakariya Knickerbocker, RN 

## 2016-04-16 ENCOUNTER — Ambulatory Visit: Payer: PRIVATE HEALTH INSURANCE | Admitting: Family

## 2016-04-20 ENCOUNTER — Ambulatory Visit: Payer: PRIVATE HEALTH INSURANCE | Admitting: Family

## 2016-04-20 DIAGNOSIS — E538 Deficiency of other specified B group vitamins: Secondary | ICD-10-CM

## 2016-04-20 NOTE — Progress Notes (Signed)
   Subjective:    Patient ID: Tina Figueroa, female    DOB: 02-27-1981, 35 y.o.   MRN: [id number]  HPI    Review of Systems     Objective:   Physical Exam        Assessment & Plan:

## 2016-04-20 NOTE — Progress Notes (Signed)
Pre visit review using our clinic review tool, if applicable. No additional management support is needed unless otherwise documented below in the visit note.  Pt here for second B-12 injection. Appeared to have tolerated the injection well. Will return next week for third B-12 injection.

## 2016-04-27 ENCOUNTER — Ambulatory Visit (INDEPENDENT_AMBULATORY_CARE_PROVIDER_SITE_OTHER): Payer: PRIVATE HEALTH INSURANCE | Admitting: *Deleted

## 2016-04-27 DIAGNOSIS — E538 Deficiency of other specified B group vitamins: Secondary | ICD-10-CM | POA: Diagnosis not present

## 2016-04-27 MED ORDER — CYANOCOBALAMIN 1000 MCG/ML IJ SOLN
1000.0000 ug | Freq: Once | INTRAMUSCULAR | Status: AC
  Administered 2016-04-27: 1000 ug via INTRAMUSCULAR

## 2016-04-27 NOTE — Progress Notes (Signed)
Pre visit review using our clinic review tool, if applicable. No additional management support is needed unless otherwise documented below in the visit note.  Patient tolerated injection well. Next appointment: 05/04/16 @0915 .

## 2016-05-04 ENCOUNTER — Ambulatory Visit (INDEPENDENT_AMBULATORY_CARE_PROVIDER_SITE_OTHER): Payer: PRIVATE HEALTH INSURANCE

## 2016-05-04 DIAGNOSIS — E538 Deficiency of other specified B group vitamins: Secondary | ICD-10-CM | POA: Diagnosis not present

## 2016-05-04 MED ORDER — CYANOCOBALAMIN 1000 MCG/ML IJ SOLN
1000.0000 ug | Freq: Once | INTRAMUSCULAR | Status: AC
  Administered 2016-05-04: 1000 ug via INTRAMUSCULAR

## 2016-05-04 NOTE — Progress Notes (Signed)
Pre visit review using our clinic tool,if applicable. No additional management support is needed unless otherwise documented below in the visit note.   Patient in for B12 injection per Sandford CrazeMelissa O'Sullivan, NP Given IM Right deltoid. Patient tolerated well.

## 2016-05-28 ENCOUNTER — Encounter: Payer: Self-pay | Admitting: Family

## 2016-06-08 ENCOUNTER — Ambulatory Visit: Payer: PRIVATE HEALTH INSURANCE | Admitting: Family

## 2016-07-20 ENCOUNTER — Encounter: Payer: Self-pay | Admitting: Family

## 2016-07-20 ENCOUNTER — Ambulatory Visit (INDEPENDENT_AMBULATORY_CARE_PROVIDER_SITE_OTHER): Payer: Self-pay | Admitting: Family

## 2016-07-20 VITALS — BP 123/86 | HR 78 | Temp 98.6°F | Ht 65.0 in | Wt 285.2 lb

## 2016-07-20 DIAGNOSIS — F32A Depression, unspecified: Secondary | ICD-10-CM

## 2016-07-20 DIAGNOSIS — F329 Major depressive disorder, single episode, unspecified: Secondary | ICD-10-CM

## 2016-07-20 DIAGNOSIS — G4733 Obstructive sleep apnea (adult) (pediatric): Secondary | ICD-10-CM | POA: Diagnosis not present

## 2016-07-20 DIAGNOSIS — I10 Essential (primary) hypertension: Secondary | ICD-10-CM | POA: Diagnosis not present

## 2016-07-20 MED ORDER — METOPROLOL SUCCINATE ER 50 MG PO TB24: 100.0000 mg | ORAL_TABLET | Freq: Every day | ORAL | 1 refills | Status: DC

## 2016-07-20 MED ORDER — BUPROPION HCL 75 MG PO TABS: ORAL_TABLET | ORAL | 1 refills | Status: DC

## 2016-07-20 MED ORDER — ESCITALOPRAM OXALATE 20 MG PO TABS: 20.0000 mg | ORAL_TABLET | Freq: Every day | ORAL | 1 refills | Status: DC

## 2016-07-20 NOTE — Patient Instructions (Signed)
You will be contacted about your referral to Dr. Myrtis SerKatz. Continue your current medication.

## 2016-07-20 NOTE — Progress Notes (Signed)
Pre visit review using our clinic review tool, if applicable. No additional management support is needed unless otherwise documented below in the visit note. 

## 2016-07-20 NOTE — Assessment & Plan Note (Signed)
Uncontrolled. Will refer to orthodontist for oral appliance evaluation.

## 2016-07-20 NOTE — Progress Notes (Signed)
Subjective:    Patient ID: Tina Figueroa, female    DOB: [birth year], 36 y.o.   MRN: [id number]  HPI  Tina Figueroa is a  36 yr old female who presents today for follow up.  1) HTN- maintained on toprol xl. Denies CP/SOB/Swelling.  BP Readings from Last 3 Encounters:  07/20/16 123/86  04/09/16 137/89  12/19/15 123/82   2) Depression- maintained on wellbutrin, lexapro. Reports good mood, denies anxiety.   3) OSA-  Did not start CPAP due to cost.  Interested in referral for oral appliance. Thinks that the one time cost may be more manageable.   Review of Systems See HPI  Past Medical History:  Diagnosis Date  . B12 deficiency 04/09/2016  . Elevated blood pressure reading without diagnosis of hypertension   . History of chicken pox   . Hypertension   . Mild hyperlipidemia 03/18/2012  . OSA (obstructive sleep apnea) 03/25/2012   Severe OSA per sleep study 10/13.   Marland Kitchen. Palpitations   . PCOS (polycystic ovarian syndrome)      Social History   Social History  . Marital status: Single    Spouse name: N/A  . Number of children: 0  . Years of education: N/A   Occupational History  . Government social research officerffice Assistant [employer]   Social History Main Topics  . Smoking status: Never Smoker  . Smokeless tobacco: Never Used  . Alcohol use Yes     Comment: Rarely  . Drug use: No  . Sexual activity: Yes   Other Topics Concern  . 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 ADENOIDECTOMY  1989  . DILATION AND CURETTAGE OF UTERUS    . TONSILLECTOMY    . WISDOM TOOTH EXTRACTION      Family History  Problem Relation Age of Onset  . Heart disease Mother     CHF  . Hypertension Mother   . Depression Mother   . Asthma Mother   .  Polycystic ovary syndrome Mother   . Arthritis Father   . Diabetes Maternal Aunt   . Arthritis Maternal Uncle   . Diabetes Paternal Uncle   . Arthritis Maternal Grandmother   . Cancer Maternal Grandmother     lung  . Arthritis Paternal Grandmother   . Cancer Paternal Grandmother     breast  . Stroke Paternal Grandmother   . Diabetes Paternal Grandmother   . Cancer Paternal Grandfather     colon and prostate  . Cancer Other     aunt  . Cancer Paternal Aunt [id number]    breast    Allergies  Allergen Reactions  . Flagyl [Metronidazole Hcl]     Chalky and coated tongue  . Metformin And Related Other (See Comments)    Nausea, diarrhea  . Spironolactone     Increased menstrual bleeding.    Current Outpatient Prescriptions on File Prior to Visit  Medication Sig Dispense Refill  . buPROPion (WELLBUTRIN) 75 MG tablet Take 1 tablet twice daily. 180 tablet 1  . escitalopram (LEXAPRO) 20 MG tablet Take 1 tablet (20 mg total) by mouth daily.  90 tablet 1  . LOW-OGESTREL 0.3-30 MG-MCG tablet Take 1 tablet by mouth daily.    . meloxicam (MOBIC) 7.5 MG tablet TAKE 1 TABLET DAILY AS NEEDED FOR PAIN 90 tablet 0  . metoprolol succinate (TOPROL-XL) 50 MG 24 hr tablet TAKE 2 TABLETS DAILY 180 tablet 1  . Multiple Vitamin (MULTIVITAMIN) tablet Take 1 tablet by mouth daily.    . fluticasone (FLONASE) 50 MCG/ACT nasal spray Place 2 sprays into the nose as needed.     No current facility-administered medications on file prior to visit.     BP 123/86 (BP Location: Right Arm)   Pulse 78   Temp 98.6 F (37 C) (Oral)   Ht 5\' 5"  (1.651 m)   Wt 285 lb 3.2 oz (129.4 kg)   LMP 07/12/2016   SpO2 100%   BMI 47.46 kg/m       Objective:   Physical Exam  Constitutional: She is oriented to person, place, and time. She appears well-developed and well-nourished.  HENT:  Head: Normocephalic and atraumatic.  Cardiovascular: Normal rate, regular rhythm and normal heart sounds.   No murmur  heard. Pulmonary/Chest: Effort normal and breath sounds normal. No respiratory distress. She has no wheezes.  Neurological: She is alert and oriented to person, place, and time.  Skin: Skin is warm and dry.  Psychiatric: She has a normal mood and affect. Her behavior is normal. Judgment and thought content normal.          Assessment & Plan:

## 2016-07-20 NOTE — Assessment & Plan Note (Signed)
Stable on toprol xl.  Continue same.

## 2016-07-20 NOTE — Assessment & Plan Note (Signed)
Stable on current meds.  Continue same. 

## 2016-07-24 ENCOUNTER — Ambulatory Visit (INDEPENDENT_AMBULATORY_CARE_PROVIDER_SITE_OTHER): Payer: PRIVATE HEALTH INSURANCE | Admitting: Family

## 2016-07-24 ENCOUNTER — Ambulatory Visit: Payer: PRIVATE HEALTH INSURANCE | Admitting: Family

## 2016-07-24 ENCOUNTER — Encounter: Payer: Self-pay | Admitting: Family

## 2016-07-24 VITALS — BP 130/82 | HR 85 | Temp 98.4°F | Ht 65.0 in | Wt 285.0 lb

## 2016-07-24 DIAGNOSIS — L918 Other hypertrophic disorders of the skin: Secondary | ICD-10-CM

## 2016-07-24 NOTE — Patient Instructions (Signed)
Keep area clean and dry today. You may shower as normal tomorrow. Call if you develop redness/drainage from the site. We will contact you about your biopsy results.

## 2016-07-24 NOTE — Progress Notes (Signed)
Pre visit review using our clinic review tool, if applicable. No additional management support is needed unless otherwise documented below in the visit note. 

## 2016-07-24 NOTE — Addendum Note (Signed)
Addended by: Orlene OchRENCE, Jowanna Loeffler N on: 07/24/2016 09:36 AM   Modules accepted: Orders

## 2016-07-24 NOTE — Progress Notes (Signed)
Subjective:    Patient ID: Tina Figueroa, female    DOB: 01-03-1981, 36 y.o.   MRN: [id number]  HPI  [name] is a 36 yr old female who presents today for skin tag removal.    Review of Systems See HPI  Past Medical History:  Diagnosis Date  . B12 deficiency 04/09/2016  . Elevated blood pressure reading without diagnosis of hypertension   . History of chicken pox   . Hypertension   . Mild hyperlipidemia 03/18/2012  . OSA (obstructive sleep apnea) 03/25/2012   Severe OSA per sleep study 10/13.   Marland Kitchen Palpitations   . PCOS (polycystic ovarian syndrome)      Social History   Social History  . Marital status: Single    Spouse name: N/A  . Number of children: 0  . Years of education: N/A   Occupational History  . 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 Smoking status: Never Smoker  . Smokeless tobacco: Never Used  . Alcohol use Yes     Comment: Rarely  . Drug use: No  . Sexual activity: Yes   Other Topics Concern  . 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 ADENOIDECTOMY  1989  . DILATION AND CURETTAGE OF UTERUS    . TONSILLECTOMY    . WISDOM TOOTH EXTRACTION      Family History  Problem Relation Age of Onset  . Heart disease Mother     CHF  . Hypertension Mother   . Depression Mother   . Asthma Mother   . Polycystic ovary syndrome Mother   . Arthritis Father   . Diabetes Maternal Aunt   . Arthritis Maternal Uncle   . Diabetes Paternal Uncle   . Arthritis Maternal Grandmother   . Cancer Maternal Grandmother     lung  . Arthritis Paternal Grandmother   . Cancer Paternal Grandmother     breast  . Stroke Paternal Grandmother   . Diabetes Paternal Grandmother   .  Cancer Paternal Grandfather     colon and prostate  . Cancer Other     aunt  . Cancer Paternal Aunt 53    breast    Allergies  Allergen Reactions  . Flagyl [Metronidazole Hcl]     Chalky and coated tongue  . Metformin And Related Other (See Comments)    Nausea, diarrhea  . Spironolactone     Increased menstrual bleeding.    Current Outpatient Prescriptions on File Prior to Visit  Medication Sig Dispense Refill  . buPROPion (WELLBUTRIN) 75 MG tablet Take 1 tablet twice daily. 180 tablet 1  . escitalopram (LEXAPRO) 20 MG tablet Take 1 tablet (20 mg total) by mouth daily. 90 tablet 1  . LOW-OGESTREL 0.3-30 MG-MCG tablet Take 1 tablet by mouth daily.    . meloxicam (MOBIC) 7.5 MG tablet TAKE 1 TABLET DAILY AS NEEDED FOR PAIN 90 tablet 0  . metoprolol succinate (TOPROL-XL) 50 MG 24 hr tablet Take 2 tablets (100 mg total) by mouth daily. Take with or immediately following a meal. 180 tablet 1  .  Multiple Vitamin (MULTIVITAMIN) tablet Take 1 tablet by mouth daily.    . fluticasone (FLONASE) 50 MCG/ACT nasal spray Place 2 sprays into the nose as needed.     No current facility-administered medications on file prior to visit.     BP 130/82 (BP Location: Right Arm, Cuff Size: Large)   Pulse 85   Temp 98.4 F (36.9 C) (Oral)   Ht 5\' 5"  (1.651 m)   Wt 285 lb (129.3 kg)   LMP 07/12/2016   SpO2 98%   BMI 47.43 kg/m       Objective:   Physical Exam  Constitutional: She is oriented to person, place, and time. She appears well-developed and well-nourished. No distress.  HENT:  Head: Normocephalic and atraumatic.  Neurological: She is alert and oriented to person, place, and time.  Skin: Skin is warm and dry. No rash noted. No erythema.  Skin tag noted in right axilla   Psychiatric: She has a normal mood and affect. Her behavior is normal. Judgment and thought content normal.          Assessment & Plan:  Skin tag- Procedure including risks/benefits explained to patient.   Questions were answered. After informed consent was obtained  site was cleansed with betadine and then alcohol. 1% Lidocaine  was injected under lesion and then shave removal of lesion was performed. Area was cauterized to obtain hemostasis.  Pt tolerated procedure well.  Specimen sent for pathology review.  Pt instructed to keep the area dry for 24 hours and to contact us if he develops redness, drainage or swelling at the site.  Pt may use tylenol as needed for discomfort today.

## 2016-08-15 ENCOUNTER — Telehealth: Payer: Self-pay | Admitting: *Deleted

## 2016-08-15 NOTE — Telephone Encounter (Signed)
Received completed and signed Rx for Oral Appliance Therapy to Treat OSA-Statement of Medical Necessity [facility].  Faxed Statement of Medical Necessity and History and Physical and most recent office notes, and demographics to [facility] @ [phone]) on (08/06/16).  Confirmation received.//AB/CMA

## 2016-09-28 ENCOUNTER — Encounter: Payer: Self-pay | Admitting: Family

## 2016-10-17 ENCOUNTER — Ambulatory Visit (INDEPENDENT_AMBULATORY_CARE_PROVIDER_SITE_OTHER): Payer: PRIVATE HEALTH INSURANCE | Admitting: Family

## 2016-10-17 ENCOUNTER — Encounter: Payer: Self-pay | Admitting: Family

## 2016-10-17 NOTE — Progress Notes (Signed)
Pre visit review using our clinic review tool, if applicable. No additional management support is needed unless otherwise documented below in the visit note. 

## 2016-10-17 NOTE — Progress Notes (Signed)
Subjective:    Patient ID: Tina Figueroa, female    DOB: [name]2 y.o.   MRN: [id number]  HPI    Tina Figueroa is a 36 yr old female who presents today to discuss obesity and weight loss. Reports that she has been eating smaller portions and trying to eat healthier.  Walks 30 minutes a day 5 days a week.  She is not counting calories.    24 hour recall:  Breakfast  Brown sugar oatmeal packet Water  snack Sweet and salty bar  Lunch Honey baked ham sandwich, wheat bread, mayo Water  PM snack Flips yogurt  Dinner mcchicken sandwich  Sweet tea  No PM snack.     Wt Readings from Last 3 Encounters:  10/17/16 288 lb 3.2 oz (130.7 kg)  07/24/16 285 lb (129.3 kg)  07/20/16 285 lb 3.2 oz (129.4 kg)   BP Readings from Last 3 Encounters:  10/17/16 (!) 117/94  07/24/16 130/82  07/20/16 123/86     Review of Systems See HPI  Past Medical History:  Diagnosis Date  . B12 deficiency 04/09/2016  . Elevated blood pressure reading without diagnosis of hypertension   . History of chicken pox   . Hypertension   . Mild hyperlipidemia 03/18/2012  . OSA (obstructive sleep apnea) 03/25/2012   Severe OSA per sleep study 10/13.   Marland Kitchen Palpitations   . PCOS (polycystic ovarian syndrome)      Social History   Social History  . Marital status: Single    Spouse name: N/A  . Number of children: 0  . Years of education: N/A   Occupational History  . 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 Smoking status: Never Smoker  . Smokeless tobacco: Never Used  . Alcohol use Yes     Comment: Rarely  . Drug use: No  . Sexual activity: Yes   Other Topics Concern  . 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 ADENOIDECTOMY  1989  . DILATION AND CURETTAGE OF UTERUS    . TONSILLECTOMY    . WISDOM TOOTH EXTRACTION      Family History  Problem Relation Age of Onset  . Heart disease Mother     CHF  . Hypertension Mother   . Depression Mother   . Asthma Mother   . Polycystic ovary syndrome Mother   . Arthritis Father   . Diabetes Maternal Aunt   . Arthritis Maternal Uncle   . Diabetes Paternal Uncle   . Arthritis Maternal Grandmother   . Cancer Maternal Grandmother     lung  . Arthritis Paternal Grandmother   . Cancer Paternal Grandmother     breast  . Stroke Paternal Grandmother   . Diabetes Paternal Grandmother   . Cancer Paternal Grandfather     colon and prostate  . Cancer Other     aunt  . Cancer Paternal Aunt 43    breast    Allergies  Allergen Reactions  . Flagyl [Metronidazole Hcl]     Chalky and coated tongue  . Metformin And Related Other (  See Comments)    Nausea, diarrhea  . Spironolactone     Increased menstrual bleeding.    Current Outpatient Prescriptions on File Prior to Visit  Medication Sig Dispense Refill  . buPROPion (WELLBUTRIN) 75 MG tablet Take 1 tablet twice daily. 180 tablet 1  . escitalopram (LEXAPRO) 20 MG tablet Take 1 tablet (20 mg total) by mouth daily. 90 tablet 1  . LOW-OGESTREL 0.3-30 MG-MCG tablet Take 1 tablet by mouth daily.    . meloxicam (MOBIC) 7.5 MG tablet TAKE 1 TABLET DAILY AS NEEDED FOR PAIN 90 tablet 0  . metoprolol succinate (TOPROL-XL) 50 MG 24 hr tablet Take 2 tablets (100 mg total) by mouth daily. Take with or immediately following a meal. 180 tablet 1  . Multiple Vitamin (MULTIVITAMIN) tablet Take 1 tablet by mouth daily.    . fluticasone (FLONASE) 50 MCG/ACT nasal spray Place 2 sprays into the nose as needed.     No current facility-administered medications on file prior to visit.     BP (!) 117/94 (BP Location: Right Arm, Cuff Size: Large)   Pulse 81   Temp 98.6 F (37 C) (Oral)   Resp 16   Ht 5'  5" (1.651 m)   Wt 288 lb 3.2 oz (130.7 kg)   LMP 09/30/2016   SpO2 100%   BMI 47.96 kg/m       Objective:   Physical Exam  Constitutional: She is oriented to person, place, and time. She appears well-developed and well-nourished.  Musculoskeletal: She exhibits no edema.  Neurological: She is alert and oriented to person, place, and time.  Psychiatric: She has a normal mood and affect. Her behavior is normal. Judgment and thought content normal.          Assessment & Plan:  Morbid Obesity- discussed dietary changes, increasing protein, fresh fruits/veggies, trying to keep calories to 1500/day. Continue regular exercise. Consider saxenda in 3 months if no weight loss.  25 minutes spent on counseling for diet/exercise/weight loss.

## 2016-10-17 NOTE — Patient Instructions (Signed)
For breakfast need 1 serving of protein (egg/yogurt/turkey sausage) and a fresh fruit or veggie.  Snack (handful of nuts, fresh fruit or veggie)  Lunch  Protein, carb (bread 1-2 slices), fresh fruit or veggie  PM Fruit or veggie  Secondary school teacher protein (grilled chicken/fish) veggie, 1 serving of carb   Try to stick to 1500 calories a day  Continue regular exercise.

## 2016-10-24 ENCOUNTER — Ambulatory Visit: Payer: Self-pay | Admitting: Family

## 2016-12-20 ENCOUNTER — Telehealth: Payer: Self-pay

## 2016-12-20 DIAGNOSIS — Z3040 Encounter for surveillance of contraceptives, unspecified: Secondary | ICD-10-CM

## 2016-12-20 MED ORDER — NORGESTREL-ETHINYL ESTRADIOL 0.3-30 MG-MCG PO TABS: 1.0000 | ORAL_TABLET | Freq: Every day | ORAL | 0 refills | Status: DC

## 2016-12-20 NOTE — Telephone Encounter (Signed)
PT called stating that she was on her last BC pill. Pt was told that she needed to be seen in the office in order to get her 12 month supply of birth control. Pt made appt for annual exam. One month of BC was given to hold her over until her exam in 2 weeks.

## 2017-01-03 ENCOUNTER — Encounter: Payer: Self-pay | Admitting: Obstetrics & Gynecology

## 2017-01-03 ENCOUNTER — Ambulatory Visit (INDEPENDENT_AMBULATORY_CARE_PROVIDER_SITE_OTHER): Payer: PRIVATE HEALTH INSURANCE | Admitting: Obstetrics & Gynecology

## 2017-01-03 VITALS — BP 148/99 | HR 91 | Ht 64.5 in | Wt 290.0 lb

## 2017-01-03 DIAGNOSIS — Z124 Encounter for screening for malignant neoplasm of cervix: Secondary | ICD-10-CM

## 2017-01-03 DIAGNOSIS — Z8742 Personal history of other diseases of the female genital tract: Secondary | ICD-10-CM

## 2017-01-03 DIAGNOSIS — Z3040 Encounter for surveillance of contraceptives, unspecified: Secondary | ICD-10-CM

## 2017-01-03 DIAGNOSIS — Z01419 Encounter for gynecological examination (general) (routine) without abnormal findings: Secondary | ICD-10-CM

## 2017-01-03 DIAGNOSIS — Z1151 Encounter for screening for human papillomavirus (HPV): Secondary | ICD-10-CM

## 2017-01-03 MED ORDER — NORGESTREL-ETHINYL ESTRADIOL 0.3-30 MG-MCG PO TABS: 1.0000 | ORAL_TABLET | Freq: Every day | ORAL | 12 refills | Status: DC

## 2017-01-03 NOTE — Progress Notes (Signed)
Subjective:     Tina Figueroa is a 36 y.o. female here for a routine exam.  Current complaints: pt reports a cyst that recently ruptured. She has a h/o PCOS and reports that she is aware of when the cysts come and when they rupture. She is currently having no pain. She recently began an exercise program.     Gynecologic History Patient's last menstrual period was 12/12/2016 (exact date). Contraception: OCP (estrogen/progesterone) Last Pap: 06/2014. Results were: normal (per pt) Last mammogram: n/a  Obstetric History OB History  Gravida Para Term Preterm AB Living  0 0 0 0 0 0  SAB TAB Ectopic Multiple Live Births  0 0 0 0         The following portions of the patient's history were reviewed and updated as appropriate: allergies, current medications, past family history, past medical history, past social history, past surgical history and problem list.  Review of Systems Pertinent items are noted in HPI.    Objective:  BP (!) 148/99 (BP Location: Left Arm)   Pulse 91   Ht 5' 4.5" (1.638 m)   Wt 290 lb (131.5 kg)   LMP 12/12/2016 (Exact Date)   BMI 49.01 kg/m   General Appearance:    Alert, cooperative, no distress, appears stated age  Head:    Normocephalic, without obvious abnormality, atraumatic  Eyes:    conjunctiva/corneas clear, EOM's intact, both eyes  Ears:    Normal external ear canals, both ears  Nose:   Nares normal, septum midline, mucosa normal, no drainage    or sinus tenderness  Throat:   Lips, mucosa, and tongue normal; teeth and gums normal  Neck:   Supple, symmetrical, trachea midline, no adenopathy;    thyroid:  no enlargement/tenderness/nodules  Back:     Symmetric, no curvature, ROM normal, no CVA tenderness  Lungs:     Clear to auscultation bilaterally, respirations unlabored  Chest Wall:    No tenderness or deformity   Heart:    Regular rate and rhythm, S1 and S2 normal, no murmur, rub   or gallop  Breast Exam:    No tenderness, masses, or nipple  abnormality  Abdomen:     Soft, non-tender, bowel sounds active all four quadrants,    no masses, no organomegaly  Genitalia:    Normal female without lesion, discharge or tenderness     Extremities:   Extremities normal, atraumatic, no cyanosis or edema  Pulses:   2+ and symmetric all extremities  Skin:   Skin color, texture, turgor normal, no rashes or lesions; has female pattern facial hair     Assessment:    Healthy female exam.   PCOS- sx controlled on OCPs   Plan:    Contraception: OCP (estrogen/progesterone). Follow up in: 1 year.    F/u PAP with hrHPV Cont exercise program. encouraged healthy diet. Reccommended Weight Watchers.  [name] L. Harraway-Smith, M.D., [facility]

## 2017-01-04 LAB — CYTOLOGY - PAP
DIAGNOSIS: NEGATIVE
HPV (WINDOPATH): NOT DETECTED

## 2017-01-18 ENCOUNTER — Ambulatory Visit: Payer: PRIVATE HEALTH INSURANCE | Admitting: Family

## 2017-01-22 ENCOUNTER — Encounter: Payer: Self-pay | Admitting: Family

## 2017-01-22 ENCOUNTER — Ambulatory Visit (INDEPENDENT_AMBULATORY_CARE_PROVIDER_SITE_OTHER): Payer: PRIVATE HEALTH INSURANCE | Admitting: Family

## 2017-01-22 VITALS — BP 112/80 | HR 96 | Temp 98.3°F | Ht 65.0 in | Wt 287.1 lb

## 2017-01-22 DIAGNOSIS — F329 Major depressive disorder, single episode, unspecified: Secondary | ICD-10-CM | POA: Diagnosis not present

## 2017-01-22 DIAGNOSIS — I10 Essential (primary) hypertension: Secondary | ICD-10-CM

## 2017-01-22 DIAGNOSIS — F32A Depression, unspecified: Secondary | ICD-10-CM

## 2017-01-22 LAB — BASIC METABOLIC PANEL
BUN: 7 mg/dL (ref 6–23)
CALCIUM: 9.1 mg/dL (ref 8.4–10.5)
CO2: 24 mEq/L (ref 19–32)
CREATININE: 0.86 mg/dL (ref 0.40–1.20)
Chloride: 107 mEq/L (ref 96–112)
GFR: 95.16 mL/min (ref >60.00)
GLUCOSE: 81 mg/dL (ref 70–99)
Potassium: 3.5 mEq/L (ref 3.5–5.1)
Sodium: 138 mEq/L (ref 135–145)

## 2017-01-22 NOTE — Progress Notes (Signed)
Subjective:    Patient ID: Tina Figueroa, female    DOB: September 13, 1980, 36 y.o.   MRN: [id number]  HPI  Ms. Tina Figueroa is a 36 yr old female who presents today for follow up.  HTN- Maintained on metoprolol. Denies CP/SOB.  BP Readings from Last 3 Encounters:  01/22/17 112/80  01/03/17 (!) 148/99  10/17/16 (!) 117/94   Morbid obesity- reports that she has been inconsistent with diet but is waking more.  Wt Readings from Last 3 Encounters:  01/22/17 287 lb 2 oz (130.2 kg)  01/03/17 290 lb (131.5 kg)  10/17/16 288 lb 3.2 oz (130.7 kg)   Depression- reports mood is good. Tolerating wellbutrin and lexapro.    Review of Systems See HPI  Past Medical History:  Diagnosis Date  . B12 deficiency 04/09/2016  . Elevated blood pressure reading without diagnosis of hypertension   . History of chicken pox   . Hypertension   . Mild hyperlipidemia 03/18/2012  . OSA (obstructive sleep apnea) 03/25/2012   Severe OSA per sleep study 10/13.   Marland Kitchen Palpitations   . PCOS (polycystic ovarian syndrome)      Social History   Social History  . Marital status: Single    Spouse name: N/A  . Number of children: 0  . Years of education: N/A   Occupational History  . 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 Smoking status: Never Smoker  . Smokeless tobacco: Never Used  . Alcohol use Yes     Comment: Rarely  . Drug use: No  . Sexual activity: Yes   Other Topics Concern  . 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 ADENOIDECTOMY  1989  . DILATION AND CURETTAGE OF UTERUS    . TONSILLECTOMY    . WISDOM TOOTH EXTRACTION      Family History  Problem Relation Age of Onset  . Heart disease Mother        CHF  .  Hypertension Mother   . Depression Mother   . Asthma Mother   . Polycystic ovary syndrome Mother   . Arthritis Father   . Cancer Other        aunt  . Diabetes Maternal Aunt   . Arthritis Maternal Uncle   . Diabetes Paternal Uncle   . Arthritis Maternal Grandmother   . Cancer Maternal Grandmother        lung  . Arthritis Paternal Grandmother   . Cancer Paternal Grandmother        breast  . Stroke Paternal Grandmother   . Diabetes Paternal Grandmother   . Cancer Paternal Grandfather        colon and prostate  . Cancer Paternal Aunt 66       breast    Allergies  Allergen Reactions  . Flagyl [Metronidazole Hcl]     Chalky and coated tongue  . Metformin And Related Other (See Comments)    Nausea, diarrhea  . Spironolactone     Increased menstrual bleeding.    Current Outpatient Prescriptions on File Prior to Visit  Medication Sig Dispense  Refill  . buPROPion (WELLBUTRIN) 75 MG tablet Take 1 tablet twice daily. 180 tablet 1  . escitalopram (LEXAPRO) 20 MG tablet Take 1 tablet (20 mg total) by mouth daily. 90 tablet 1  . LOW-OGESTREL 0.3-30 MG-MCG tablet Take 1 tablet by mouth daily.    . meloxicam (MOBIC) 7.5 MG tablet TAKE 1 TABLET DAILY AS NEEDED FOR PAIN 90 tablet 0  . metoprolol succinate (TOPROL-XL) 50 MG 24 hr tablet Take 2 tablets (100 mg total) by mouth daily. Take with or immediately following a meal. 180 tablet 1  . Multiple Vitamin (MULTIVITAMIN) tablet Take 1 tablet by mouth daily.    . norgestrel-ethinyl estradiol (LOW-OGESTREL) 0.3-30 MG-MCG tablet Take 1 tablet by mouth daily. 1 Package 12  . fluticasone (FLONASE) 50 MCG/ACT nasal spray Place 2 sprays into the nose as needed.     No current facility-administered medications on file prior to visit.     BP 112/80 (BP Location: Left Arm, Patient Position: Sitting, Cuff Size: Large)   Pulse 96   Temp 98.3 F (36.8 C) (Oral)   Ht 5\' 5"  (1.651 m)   Wt 287 lb 2 oz (130.2 kg)   SpO2 97%   BMI 47.78 kg/m    c    Objective:   Physical Exam  Constitutional: She appears well-developed and well-nourished.  Cardiovascular: Normal rate, regular rhythm and normal heart sounds.   No murmur heard. Pulmonary/Chest: Effort normal and breath sounds normal. No respiratory distress. She has no wheezes.  Psychiatric: She has a normal mood and affect. Her behavior is normal. Judgment and thought content normal.          Assessment & Plan:  HTN- stable on current med, continue same, obtain follow up bmet.  Depression- stable, continue current meds.  Morbid obesity- uncontrolled, Counseled pt on diet/exercise and weight loss.

## 2017-01-22 NOTE — Patient Instructions (Signed)
Please complete lab work prior to leaving.   

## 2017-01-22 NOTE — Progress Notes (Signed)
Pre visit review using our clinic review tool, if applicable. No additional management support is needed unless otherwise documented below in the visit note. 

## 2017-02-02 ENCOUNTER — Encounter: Payer: Self-pay | Admitting: Family

## 2017-02-12 ENCOUNTER — Encounter: Payer: Self-pay | Admitting: Family

## 2017-03-19 ENCOUNTER — Encounter: Payer: Self-pay | Admitting: Family

## 2017-04-14 ENCOUNTER — Other Ambulatory Visit: Payer: Self-pay | Admitting: Family

## 2017-05-03 ENCOUNTER — Ambulatory Visit (INDEPENDENT_AMBULATORY_CARE_PROVIDER_SITE_OTHER): Payer: PRIVATE HEALTH INSURANCE | Admitting: Family

## 2017-05-03 ENCOUNTER — Encounter: Payer: Self-pay | Admitting: Family

## 2017-05-03 DIAGNOSIS — E538 Deficiency of other specified B group vitamins: Secondary | ICD-10-CM | POA: Diagnosis not present

## 2017-05-03 DIAGNOSIS — Z0000 Encounter for general adult medical examination without abnormal findings: Secondary | ICD-10-CM

## 2017-05-03 DIAGNOSIS — Z23 Encounter for immunization: Secondary | ICD-10-CM | POA: Diagnosis not present

## 2017-05-03 LAB — BASIC METABOLIC PANEL
BUN: 8 mg/dL (ref 6–23)
CALCIUM: 9.3 mg/dL (ref 8.4–10.5)
CO2: 24 meq/L (ref 19–32)
CREATININE: 0.81 mg/dL (ref 0.40–1.20)
Chloride: 107 mEq/L (ref 96–112)
GFR: 101.82 mL/min (ref >60.00)
Glucose, Bld: 90 mg/dL (ref 70–99)
Potassium: 4.1 mEq/L (ref 3.5–5.1)
Sodium: 139 mEq/L (ref 135–145)

## 2017-05-03 LAB — CBC WITH DIFFERENTIAL/PLATELET
BASOS ABS: 0.1 10*3/uL (ref 0.0–0.1)
Basophils Relative: 0.8 % (ref 0.0–3.0)
EOS ABS: 0.2 10*3/uL (ref 0.0–0.7)
Eosinophils Relative: 3.2 % (ref 0.0–5.0)
HCT: 43.4 % (ref 36.0–46.0)
Hemoglobin: 14.3 g/dL (ref 12.0–15.0)
LYMPHS ABS: 2 10*3/uL (ref 0.7–4.0)
LYMPHS PCT: 28.1 % (ref 12.0–46.0)
MCHC: 32.9 g/dL (ref 30.0–36.0)
MCV: 95.2 fl (ref 78.0–100.0)
MONO ABS: 0.5 10*3/uL (ref 0.1–1.0)
Monocytes Relative: 7.6 % (ref 3.0–12.0)
NEUTROS ABS: 4.3 10*3/uL (ref 1.4–7.7)
NEUTROS PCT: 60.3 % (ref 43.0–77.0)
PLATELETS: 234 10*3/uL (ref 150.0–400.0)
RBC: 4.56 Mil/uL (ref 3.87–5.11)
RDW: 12.7 % (ref 11.5–15.5)
WBC: 7.2 10*3/uL (ref 4.0–10.5)

## 2017-05-03 LAB — HEPATIC FUNCTION PANEL
ALK PHOS: 82 U/L (ref 39–117)
ALT: 26 U/L (ref 0–35)
AST: 20 U/L (ref 0–37)
Albumin: 3.9 g/dL (ref 3.5–5.2)
BILIRUBIN DIRECT: 0.2 mg/dL (ref 0.0–0.3)
BILIRUBIN TOTAL: 0.7 mg/dL (ref 0.2–1.2)
Total Protein: 7.4 g/dL (ref 6.0–8.3)

## 2017-05-03 LAB — URINALYSIS, ROUTINE W REFLEX MICROSCOPIC
BILIRUBIN URINE: NEGATIVE
Ketones, ur: NEGATIVE
Leukocytes, UA: NEGATIVE
Nitrite: NEGATIVE
PH: 5.5 (ref 5.0–8.0)
RBC / HPF: NONE SEEN (ref 0–?)
Total Protein, Urine: NEGATIVE
Urine Glucose: NEGATIVE
Urobilinogen, UA: 1 (ref 0.0–1.0)

## 2017-05-03 LAB — LIPID PANEL
CHOL/HDL RATIO: 5
Cholesterol: 197 mg/dL (ref 0–200)
HDL: 40.2 mg/dL (ref >39.00)
LDL Cholesterol: 146 mg/dL — ABNORMAL HIGH (ref 0–99)
NONHDL: 156.67
TRIGLYCERIDES: 55 mg/dL (ref 0.0–149.0)
VLDL: 11 mg/dL (ref 0.0–40.0)

## 2017-05-03 LAB — VITAMIN B12

## 2017-05-03 LAB — TSH: TSH: 2.11 u[IU]/mL (ref 0.35–4.50)

## 2017-05-03 MED ORDER — CYANOCOBALAMIN 1000 MCG/ML IJ SOLN
1000.0000 ug | Freq: Once | INTRAMUSCULAR | Status: AC
  Administered 2017-05-03: 1000 ug via INTRAMUSCULAR

## 2017-05-03 NOTE — Progress Notes (Signed)
Subjective:    Patient ID: Tina MillinPamela J Figueroa, female    DOB: 21-Jun-1980, 36 y.o.   MRN: [id number]  HPI  Patient presents today for complete physical.  Immunizations: tetanus and flu shot up to date Diet:  Fair diet, needs some improvement Wt Readings from Last 3 Encounters:  05/03/17 294 lb (133.4 kg)  01/22/17 287 lb 2 oz (130.2 kg)  01/03/17 290 lb (131.5 kg)  Exercise: walks with her coworker Pap: 7/18- normal per patient Vision: this past year Dental: up to date   Review of Systems  Constitutional: Positive for unexpected weight change.  HENT: Negative for hearing loss and rhinorrhea.   Eyes: Negative for visual disturbance.  Respiratory: Negative for cough.   Cardiovascular: Negative for leg swelling.  Gastrointestinal: Negative for constipation and diarrhea.  Genitourinary: Negative for dysuria and frequency.  Musculoskeletal: Negative for arthralgias and myalgias.  Neurological: Negative for headaches.  Hematological: Negative for adenopathy.  Psychiatric/Behavioral:       Denies depression/anxiety   Past Medical History:  Diagnosis Date  . B12 deficiency 04/09/2016  . Elevated blood pressure reading without diagnosis of hypertension   . History of chicken pox   . Hypertension   . Mild hyperlipidemia 03/18/2012  . OSA (obstructive sleep apnea) 03/25/2012   Severe OSA per sleep study 10/13.   Marland Kitchen. Palpitations   . PCOS (polycystic ovarian syndrome)      Social History   Socioeconomic History  . Marital status: Single    Spouse name: Not on file  . Number of children: 0  . Years of education: Not on file  . Highest education level: Not on file  Social Needs  . Financial resource strain: Not on file  . Food insecurity - worry: Not on file  . Food insecurity - inability: Not on file  . Transportation needs - medical: Not on file  . Transportation needs - non-medical: Not on file  Occupational History  . Occupation: Chartered certified accountantffice Assistant    Employer: [employer]    Tobacco Use  . Smoking status: Never Smoker  . Smokeless tobacco: Never Used  Substance and Sexual Activity  . Alcohol use: Yes    Comment: Rarely  . Drug use: No  . Sexual activity: Yes  Other Topics Concern  . Not on file  Social History Narrative   Regular exercise;  No   Caffeine Use:  1-2 daily   Works at [employer]   No children   Single   Some college   Plans to return to school in January for medical office administration          Past Surgical History:  Procedure Laterality Date  . ADENOIDECTOMY  1989  . DILATION AND CURETTAGE OF UTERUS    . TONSILLECTOMY    . WISDOM TOOTH EXTRACTION      Family History  Problem Relation Age of Onset  . Heart disease Mother        CHF  . Hypertension Mother   . Depression Mother   . Asthma Mother   . Polycystic ovary syndrome Mother   . Arthritis Father   . Cancer Other        aunt  . Diabetes Maternal Aunt   . Arthritis Maternal Uncle   . Diabetes Paternal Uncle   . Arthritis Maternal Grandmother   . Cancer Maternal Grandmother        lung  . Arthritis Paternal Grandmother   . Cancer Paternal Grandmother  breast  . Stroke Paternal Grandmother   . Diabetes Paternal Grandmother   . Cancer Paternal Grandfather        colon and prostate  . Cancer Paternal Aunt 372       breast    Allergies  Allergen Reactions  . Flagyl [Metronidazole Hcl]     Chalky and coated tongue  . Metformin And Related Other (See Comments)    Nausea, diarrhea  . Spironolactone     Increased menstrual bleeding.    Current Outpatient Medications on File Prior to Visit  Medication Sig Dispense Refill  . buPROPion (WELLBUTRIN) 75 MG tablet Take 1 tablet twice daily. 180 tablet 1  . escitalopram (LEXAPRO) 20 MG tablet Take 1 tablet (20 mg total) by mouth daily. 90 tablet 1  . LOW-OGESTREL 0.3-30 MG-MCG tablet Take 1 tablet by mouth daily.    . meloxicam (MOBIC) 7.5 MG tablet TAKE 1 TABLET DAILY AS NEEDED FOR PAIN 90  tablet 0  . metoprolol succinate (TOPROL-XL) 50 MG 24 hr tablet TAKE 2 TABLETS DAILY WITH OR IMMEDIATELY FOLLOWING A MEAL 180 tablet 1  . Multiple Vitamin (MULTIVITAMIN) tablet Take 1 tablet by mouth daily.    . norgestrel-ethinyl estradiol (LOW-OGESTREL) 0.3-30 MG-MCG tablet Take 1 tablet by mouth daily. 1 Package 12  . fluticasone (FLONASE) 50 MCG/ACT nasal spray Place 2 sprays into the nose as needed.     No current facility-administered medications on file prior to visit.     BP 118/82 (BP Location: Left Arm, Cuff Size: Large)   Pulse 75   Temp 98.4 F (36.9 C) (Oral)   Resp 16   Ht 5' 4.5" (1.638 m)   Wt 294 lb (133.4 kg)   SpO2 98%   BMI 49.69 kg/m       Objective:   Physical Exam  Physical Exam  Constitutional: She is oriented to person, place, and time. She appears well-developed and well-nourished. No distress.  HENT:  Head: Normocephalic and atraumatic.  Right Ear: Tympanic membrane and ear canal normal.  Left Ear: Tympanic membrane and ear canal normal.  Mouth/Throat: Oropharynx is clear and moist.  Eyes: Pupils are equal, round, and reactive to light. No scleral icterus.  Neck: Normal range of motion. No thyromegaly present.  Cardiovascular: Normal rate and regular rhythm.   No murmur heard. Pulmonary/Chest: Effort normal and breath sounds normal. No respiratory distress. He has no wheezes. She has no rales. She exhibits no tenderness.  Abdominal: Soft. Bowel sounds are normal. She exhibits no distension and no mass. There is no tenderness. There is no rebound and no guarding.  Musculoskeletal: She exhibits no edema.  Lymphadenopathy:    She has no cervical adenopathy.  Neurological: She is alert and oriented to person, place, and time. She has normal patellar reflexes. She exhibits normal muscle tone. Coordination normal.  Skin: Skin is warm and dry.  Psychiatric: She has a normal mood and affect. Her behavior is normal. Judgment and thought content normal.    Breasts: Examined lying Right: Without masses, retractions, discharge or axillary adenopathy.  Left: Without masses, retractions, discharge or axillary adenopathy.       Assessment & Plan:         Assessment & Plan:  Preventative care- discussed healthy diet, exercise, weight loss. Flu shot today. Overdue for b12 injection.

## 2017-05-03 NOTE — Patient Instructions (Signed)
Please complete lab work prior to leaving. Work on healthy diet, exercise, weight loss.   

## 2017-05-07 ENCOUNTER — Telehealth: Payer: Self-pay

## 2017-05-07 DIAGNOSIS — E538 Deficiency of other specified B group vitamins: Secondary | ICD-10-CM

## 2017-05-07 NOTE — Telephone Encounter (Signed)
-----   Message from Sandford CrazeMelissa O'Sullivan, NP sent at 05/03/2017  2:43 PM EST ----- b12 is normal.  Repeat b12 level in 3 months, no b12 shots in the meantime.

## 2017-06-20 ENCOUNTER — Other Ambulatory Visit: Payer: Self-pay | Admitting: Family

## 2017-06-21 MED ORDER — MELOXICAM 7.5 MG PO TABS: 7.5000 mg | ORAL_TABLET | Freq: Every day | ORAL | 0 refills | Status: DC

## 2017-08-08 ENCOUNTER — Other Ambulatory Visit (INDEPENDENT_AMBULATORY_CARE_PROVIDER_SITE_OTHER): Payer: PRIVATE HEALTH INSURANCE

## 2017-08-08 ENCOUNTER — Telehealth: Payer: Self-pay | Admitting: Family

## 2017-08-08 DIAGNOSIS — E538 Deficiency of other specified B group vitamins: Secondary | ICD-10-CM

## 2017-08-08 LAB — VITAMIN B12: VITAMIN B 12: 170 pg/mL — AB (ref 211–911)

## 2017-08-08 NOTE — Telephone Encounter (Signed)
b12 level has dropped. I would like her to restart b12 shots IM weekly x 4 weeks then once monthly.

## 2017-08-08 NOTE — Telephone Encounter (Signed)
I have scheduled first B12 injection appointment for march 1st at 9:15. Patient will schedule weekly/monthy to follow.

## 2017-08-16 ENCOUNTER — Ambulatory Visit (INDEPENDENT_AMBULATORY_CARE_PROVIDER_SITE_OTHER): Payer: PRIVATE HEALTH INSURANCE

## 2017-08-16 DIAGNOSIS — E538 Deficiency of other specified B group vitamins: Secondary | ICD-10-CM

## 2017-08-16 MED ORDER — CYANOCOBALAMIN 1000 MCG/ML IJ SOLN
1000.0000 ug | Freq: Once | INTRAMUSCULAR | Status: AC
  Administered 2017-08-16: 1000 ug via INTRAMUSCULAR

## 2017-08-16 NOTE — Progress Notes (Signed)
Pre visit review using our clinic review tool, if applicable. No additional management support is needed unless otherwise documented below in the visit note.  Pt here today for B12 injection #1. 1mL injected into L deltoid. Pt tolerated injection well. Next injection 1 week.   Nurse visit scheduled for 08/23/2017.

## 2017-08-17 NOTE — Progress Notes (Signed)
Noted and agree.  Tina FillersMelissa S Figueroa

## 2017-08-23 ENCOUNTER — Ambulatory Visit (INDEPENDENT_AMBULATORY_CARE_PROVIDER_SITE_OTHER): Payer: PRIVATE HEALTH INSURANCE | Admitting: Emergency Medicine

## 2017-08-23 DIAGNOSIS — E538 Deficiency of other specified B group vitamins: Secondary | ICD-10-CM

## 2017-08-23 MED ORDER — CYANOCOBALAMIN 1000 MCG/ML IJ SOLN
1000.0000 ug | Freq: Once | INTRAMUSCULAR | Status: AC
  Administered 2017-08-23: 1000 ug via INTRAMUSCULAR

## 2017-08-23 NOTE — Progress Notes (Signed)
Pre visit review using our clinic review tool, if applicable. No additional management support is needed unless otherwise documented below in the visit note.  Patient came in clinic for B12 injection #2. IM injection given in right deltoid. Patient tolerated injection well.   Next appointment scheduled for 08/30/17 at 9:00 am.

## 2017-08-30 ENCOUNTER — Ambulatory Visit (INDEPENDENT_AMBULATORY_CARE_PROVIDER_SITE_OTHER): Payer: PRIVATE HEALTH INSURANCE

## 2017-08-30 DIAGNOSIS — E538 Deficiency of other specified B group vitamins: Secondary | ICD-10-CM | POA: Diagnosis not present

## 2017-08-30 MED ORDER — CYANOCOBALAMIN 1000 MCG/ML IJ SOLN
1000.0000 ug | Freq: Once | INTRAMUSCULAR | Status: AC
  Administered 2017-08-30: 1000 ug via INTRAMUSCULAR

## 2017-08-30 NOTE — Progress Notes (Signed)
Reviewed. ° ° °Cecilee Rosner S O'Sullivan NP °

## 2017-08-30 NOTE — Progress Notes (Addendum)
Pt here today for B12 injection #3. 1mL injected into L deltoid. Pt tolerated injection well. Next injection 1 week.   Nurse visit scheduled for 09/06/17

## 2017-09-06 ENCOUNTER — Ambulatory Visit (INDEPENDENT_AMBULATORY_CARE_PROVIDER_SITE_OTHER): Payer: PRIVATE HEALTH INSURANCE | Admitting: *Deleted

## 2017-09-06 DIAGNOSIS — E538 Deficiency of other specified B group vitamins: Secondary | ICD-10-CM | POA: Diagnosis not present

## 2017-09-06 MED ORDER — CYANOCOBALAMIN 1000 MCG/ML IJ SOLN
1000.0000 ug | Freq: Once | INTRAMUSCULAR | Status: AC
  Administered 2017-09-06: 1000 ug via INTRAMUSCULAR

## 2017-09-06 NOTE — Progress Notes (Signed)
Pre visit review using our clinic review tool, if applicable. No additional management support is needed unless otherwise documented below in the visit note.  Pt here for 4th weekly B12 injection.  B12 1000 mcg given IM, right deltoid and pt tolerated injection well.  Monthly B12 injection scheduled for 10/09/17 at 4pm.

## 2017-09-06 NOTE — Progress Notes (Signed)
Reviewed

## 2017-09-19 ENCOUNTER — Other Ambulatory Visit: Payer: Self-pay | Admitting: Family

## 2017-10-09 ENCOUNTER — Ambulatory Visit (INDEPENDENT_AMBULATORY_CARE_PROVIDER_SITE_OTHER): Payer: PRIVATE HEALTH INSURANCE | Admitting: *Deleted

## 2017-10-09 DIAGNOSIS — E538 Deficiency of other specified B group vitamins: Secondary | ICD-10-CM

## 2017-10-09 MED ORDER — CYANOCOBALAMIN 1000 MCG/ML IJ SOLN
1000.0000 ug | Freq: Once | INTRAMUSCULAR | Status: AC
  Administered 2017-10-09: 1000 ug via INTRAMUSCULAR

## 2017-10-09 NOTE — Progress Notes (Addendum)
B12 injection 10300mcg/mL IM given in L deltoid for now monthly administration. Pt. tolerated well. Next B12 injection scheduled for 5/22 with nurse. Next appointment with Sandford CrazeMelissa O'Sullivan, NP is 11/01/17.  Note routed to doctor of the day, Esperanza RichtersEdward Saguier, [place], in NP's absence.   Agree with administration of b12 for the indication of low b12.   Esperanza RichtersEdward Saguier, PA-C

## 2017-10-12 ENCOUNTER — Other Ambulatory Visit: Payer: Self-pay | Admitting: Family

## 2017-11-01 ENCOUNTER — Encounter: Payer: Self-pay | Admitting: Family

## 2017-11-01 ENCOUNTER — Ambulatory Visit: Payer: PRIVATE HEALTH INSURANCE | Admitting: Family

## 2017-11-01 VITALS — BP 126/75 | HR 70 | Temp 98.3°F | Resp 16 | Ht 65.0 in | Wt 302.8 lb

## 2017-11-01 DIAGNOSIS — E538 Deficiency of other specified B group vitamins: Secondary | ICD-10-CM | POA: Diagnosis not present

## 2017-11-01 DIAGNOSIS — I10 Essential (primary) hypertension: Secondary | ICD-10-CM

## 2017-11-01 LAB — BASIC METABOLIC PANEL
BUN: 9 mg/dL (ref 6–23)
CHLORIDE: 107 meq/L (ref 96–112)
CO2: 26 meq/L (ref 19–32)
CREATININE: 0.82 mg/dL (ref 0.40–1.20)
Calcium: 9 mg/dL (ref 8.4–10.5)
GFR: 100.12 mL/min (ref >60.00)
GLUCOSE: 85 mg/dL (ref 70–99)
POTASSIUM: 4.4 meq/L (ref 3.5–5.1)
Sodium: 141 mEq/L (ref 135–145)

## 2017-11-01 MED ORDER — CYANOCOBALAMIN 1000 MCG/ML IJ SOLN
1000.0000 ug | Freq: Once | INTRAMUSCULAR | Status: AC
  Administered 2017-11-01: 1000 ug via INTRAMUSCULAR

## 2017-11-01 NOTE — Patient Instructions (Signed)
Please complete lab work prior to leaving.   

## 2017-11-01 NOTE — Progress Notes (Signed)
Subjective:    Patient ID: Tina Figueroa, female    DOB: 1981-01-28, 37 y.o.   MRN: [id number]  HPI  Tina Figueroa is a 37 yr old female who presents today for follow up.  1) HTN- she is maintained on toprol xl. Denies LE edema.  BP Readings from Last 3 Encounters:  05/03/17 118/82  01/22/17 112/80  01/03/17 (!) 148/99   2) Depression- maintained on wellbutrin and lexapro.  Reports stable mood.  3) B12 Deficiency-She continues monthly b12 injections.   Wt Readings from Last 3 Encounters:  11/01/17 (!) 302 lb 12.8 oz (137.3 kg)  05/03/17 294 lb (133.4 kg)  01/22/17 287 lb 2 oz (130.2 kg)    Review of Systems    see HPI  Past Medical History:  Diagnosis Date  . B12 deficiency 04/09/2016  . Elevated blood pressure reading without diagnosis of hypertension   . History of chicken pox   . Hypertension   . Mild hyperlipidemia 03/18/2012  . OSA (obstructive sleep apnea) 03/25/2012   Severe OSA per sleep study 10/13.   Marland Kitchen Palpitations   . PCOS (polycystic ovarian syndrome)      Social History   Socioeconomic History  . Marital status: Single    Spouse name: Not on file  . Number of children: 0  . Years of education: Not on file  . Highest education level: Not on file  Occupational History  . Occupation: Chartered certified accountant: [employer]  Social Needs  . Financial resource strain: Not on file  . Food insecurity:    Worry: Not on file    Inability: Not on file  . Transportation needs:    Medical: Not on file    Non-medical: Not on file  Tobacco Use  . Smoking status: Never Smoker  . Smokeless tobacco: Never Used  Substance and Sexual Activity  . Alcohol use: Yes    Comment: Rarely  . Drug use: No  . Sexual activity: Yes  Lifestyle  . Physical activity:    Days per week: Not on file    Minutes per session: Not on file  . Stress: Not on file  Relationships  . Social connections:    Talks on phone: Not on file    Gets together: Not on file    Attends  religious service: Not on file    Active member of club or organization: Not on file    Attends meetings of clubs or organizations: Not on file    Relationship status: Not on file  . Intimate partner violence:    Fear of current or ex partner: Not on file    Emotionally abused: Not on file    Physically abused: Not on file    Forced sexual activity: Not on file  Other Topics Concern  . Not on file  Social History Narrative   Regular exercise;  No   Caffeine Use:  1-2 daily   Works at [employer]   No children   Single   Some college   Plans to return to school in January for medical office administration          Past Surgical History:  Procedure Laterality Date  . ADENOIDECTOMY  1989  . DILATION AND CURETTAGE OF UTERUS    . TONSILLECTOMY    . WISDOM TOOTH EXTRACTION      Family History  Problem Relation Age of Onset  . Heart disease Mother  CHF  . Hypertension Mother   . Depression Mother   . Asthma Mother   . Polycystic ovary syndrome Mother   . Arthritis Father   . Cancer Other        aunt  . Diabetes Maternal Aunt   . Arthritis Maternal Uncle   . Diabetes Paternal Uncle   . Arthritis Maternal Grandmother   . Cancer Maternal Grandmother        lung  . Arthritis Paternal Grandmother   . Cancer Paternal Grandmother        breast  . Stroke Paternal Grandmother   . Diabetes Paternal Grandmother   . Cancer Paternal Grandfather        colon and prostate  . Cancer Paternal Aunt 50       breast    Allergies  Allergen Reactions  . Flagyl [Metronidazole Hcl]     Chalky and coated tongue  . Metformin And Related Other (See Comments)    Nausea, diarrhea  . Spironolactone     Increased menstrual bleeding.    Current Outpatient Medications on File Prior to Visit  Medication Sig Dispense Refill  . buPROPion (WELLBUTRIN) 75 MG tablet Take 1 tablet twice daily. 180 tablet 1  . escitalopram (LEXAPRO) 20 MG tablet Take 1 tablet (20 mg total) by  mouth daily. 90 tablet 1  . meloxicam (MOBIC) 7.5 MG tablet TAKE 1 TABLET DAILY 90 tablet 0  . metoprolol succinate (TOPROL-XL) 50 MG 24 hr tablet TAKE 2 TABLETS DAILY WITH OR IMMEDIATELY FOLLOWING A MEAL 180 tablet 1  . Multiple Vitamin (MULTIVITAMIN) tablet Take 1 tablet by mouth daily.    . norgestrel-ethinyl estradiol (LOW-OGESTREL) 0.3-30 MG-MCG tablet Take 1 tablet by mouth daily. 1 Package 12  . fluticasone (FLONASE) 50 MCG/ACT nasal spray Place 2 sprays into the nose as needed.     No current facility-administered medications on file prior to visit.     BP 126/75 (BP Location: Right Arm, Cuff Size: Large)   Pulse 70   Temp 98.3 F (36.8 C) (Oral)   Resp 16   Ht  (1.651 m)   Wt (!) 302 lb 12.8 oz (137.3 kg)   LMP 10/05/2017   SpO2 100%   BMI 50.39 kg/m    Objective:   Physical Exam  Constitutional: She is oriented to person, place, and time. She appears well-developed and well-nourished.  Cardiovascular: Normal rate, regular rhythm and normal heart sounds.  No murmur heard. Pulmonary/Chest: Effort normal and breath sounds normal. No respiratory distress. She has no wheezes.  Musculoskeletal: She exhibits no edema.  Neurological: She is alert and oriented to person, place, and time. She displays normal reflexes. No cranial nerve deficit or sensory deficit. She exhibits normal muscle tone. Coordination normal.  Skin: Skin is warm and dry.  Psychiatric: She has a normal mood and affect. Her behavior is normal. Judgment and thought content normal.          Assessment & Plan:  HTN- bp stable, continue current meds, obtain follow up bmet.  B12 deficiency- continue monthly b12 injections.  Injection given today.  Depression- reports good mood on current medications, continue same.

## 2017-11-06 ENCOUNTER — Ambulatory Visit: Payer: PRIVATE HEALTH INSURANCE

## 2017-12-03 ENCOUNTER — Ambulatory Visit (INDEPENDENT_AMBULATORY_CARE_PROVIDER_SITE_OTHER): Payer: PRIVATE HEALTH INSURANCE

## 2017-12-03 DIAGNOSIS — E538 Deficiency of other specified B group vitamins: Secondary | ICD-10-CM | POA: Diagnosis not present

## 2017-12-03 MED ORDER — CYANOCOBALAMIN 1000 MCG/ML IJ SOLN
1000.0000 ug | Freq: Once | INTRAMUSCULAR | Status: AC
  Administered 2017-12-03: 1000 ug via INTRAMUSCULAR

## 2017-12-03 NOTE — Progress Notes (Signed)
Adri Schloss S O'Sullivan NP 

## 2017-12-03 NOTE — Progress Notes (Signed)
3Pre visit review using our clinic tool,if applicable. No additional management support is needed unless otherwise documented below in the visit note.   Pt here for monthly B12 injection per   B12 1000mcg given IM Left deltoid,  pt tolerated injection well.  No complaints voiced this visit.  Next B12 injection scheduled for July 18,2019. Patient aware.

## 2017-12-04 ENCOUNTER — Encounter: Payer: Self-pay | Admitting: Family

## 2017-12-16 ENCOUNTER — Telehealth: Payer: Self-pay | Admitting: *Deleted

## 2017-12-16 DIAGNOSIS — Z0279 Encounter for issue of other medical certificate: Secondary | ICD-10-CM

## 2017-12-16 NOTE — Telephone Encounter (Signed)
FMLA paperwork completed [facility] [employer] Attn: Delorise Royalsuth Glenn [called and got fax number] [phone]; received fax confirmation/SLS 07/01 SEE EMAIL note from 12/04/17.

## 2017-12-21 ENCOUNTER — Other Ambulatory Visit: Payer: Self-pay | Admitting: Family

## 2018-01-02 ENCOUNTER — Ambulatory Visit (INDEPENDENT_AMBULATORY_CARE_PROVIDER_SITE_OTHER): Payer: PRIVATE HEALTH INSURANCE

## 2018-01-02 DIAGNOSIS — E538 Deficiency of other specified B group vitamins: Secondary | ICD-10-CM

## 2018-01-02 MED ORDER — CYANOCOBALAMIN 1000 MCG/ML IJ SOLN
1000.0000 ug | Freq: Once | INTRAMUSCULAR | Status: AC
  Administered 2018-01-02: 1000 ug via INTRAMUSCULAR

## 2018-01-02 NOTE — Progress Notes (Signed)
Reviewed. ° ° °Trenden Hazelrigg S O'Sullivan NP °

## 2018-01-02 NOTE — Progress Notes (Signed)
Pre visit review using our clinic tool,if applicable. No additional management support is needed unless otherwise documented below in the visit note.   Pt here for monthly B12 injection per Sandford CrazeMelissa O'Sullivan NP.  B12 1000 mcg given IM right deltoid,  Patient tolerated injection well.  Next B12 injection scheduled for February 04, 2018. Patient given appointment card.

## 2018-02-04 ENCOUNTER — Ambulatory Visit (INDEPENDENT_AMBULATORY_CARE_PROVIDER_SITE_OTHER): Payer: PRIVATE HEALTH INSURANCE

## 2018-02-04 DIAGNOSIS — Z23 Encounter for immunization: Secondary | ICD-10-CM

## 2018-02-04 DIAGNOSIS — E538 Deficiency of other specified B group vitamins: Secondary | ICD-10-CM

## 2018-02-04 MED ORDER — CYANOCOBALAMIN 1000 MCG/ML IJ SOLN
1000.0000 ug | Freq: Once | INTRAMUSCULAR | Status: AC
  Administered 2018-02-04: 1000 ug via INTRAMUSCULAR

## 2018-02-04 NOTE — Progress Notes (Signed)
Pre visit review using our clinic tool,if applicable. No additional management support is needed unless otherwise documented below in the visit note.   Pt here for monthly B12 injection per order from Sandford CrazeMelissa O'Sullivan, NP  B12 1000mcg given IM L deltoid, and pt tolerated injection well.  No complaints voiced this visit.  Next B12 injection scheduled for March 07, 2018. Patient aware.  Patient also requested influenza vaccination which was given in R deltoid.Patietn tolerated well.

## 2018-03-07 ENCOUNTER — Ambulatory Visit (INDEPENDENT_AMBULATORY_CARE_PROVIDER_SITE_OTHER): Payer: PRIVATE HEALTH INSURANCE

## 2018-03-07 DIAGNOSIS — E538 Deficiency of other specified B group vitamins: Secondary | ICD-10-CM

## 2018-03-07 MED ORDER — CYANOCOBALAMIN 1000 MCG/ML IJ SOLN
1000.0000 ug | Freq: Once | INTRAMUSCULAR | Status: AC
  Administered 2018-03-07: 1000 ug via INTRAMUSCULAR

## 2018-03-07 NOTE — Progress Notes (Signed)
Noted  Tina Mctigue R Lowne Chase, DO  

## 2018-03-07 NOTE — Progress Notes (Signed)
Pre visit review using our clinic tool,if applicable. No additional management support is needed unless otherwise documented below in the visit note.   Pt here for monthly B12 injection per order from Sandford CrazeMelissa O'Sullivan NP.  B12 1000mcg given IM right deltoid,  patient tolerated injection well.  No complaints this visit.  Next B12 injection scheduled for October 23,2019.

## 2018-03-11 ENCOUNTER — Other Ambulatory Visit: Payer: Self-pay

## 2018-03-11 DIAGNOSIS — Z01419 Encounter for gynecological examination (general) (routine) without abnormal findings: Secondary | ICD-10-CM

## 2018-03-11 DIAGNOSIS — Z3040 Encounter for surveillance of contraceptives, unspecified: Secondary | ICD-10-CM

## 2018-03-11 DIAGNOSIS — Z8742 Personal history of other diseases of the female genital tract: Secondary | ICD-10-CM

## 2018-03-11 MED ORDER — NORGESTREL-ETHINYL ESTRADIOL 0.3-30 MG-MCG PO TABS: 1.0000 | ORAL_TABLET | Freq: Every day | ORAL | 1 refills | Status: DC

## 2018-03-11 NOTE — Telephone Encounter (Signed)
Patient given one refill and will need to schedule annual exam

## 2018-03-24 ENCOUNTER — Other Ambulatory Visit: Payer: Self-pay | Admitting: Family

## 2018-03-25 NOTE — Telephone Encounter (Signed)
High risk of GI bleeding warning populated in attempting to refill meloxicam d/t pt. also being on lexapro. Order left pended; Routed to PCP to review.

## 2018-03-26 MED ORDER — FAMOTIDINE 20 MG PO TABS: 20.0000 mg | ORAL_TABLET | Freq: Two times a day (BID) | ORAL | 1 refills | Status: DC

## 2018-03-26 NOTE — Telephone Encounter (Signed)
Please advise patient that I did send a prescription refill for meloxicam.  There is an increased risk of stomach bleeding on meloxicam especially if she is taking Lexapro.  I have sent a prescription for Pepcid to her pharmacy to help protect her stomach against this.  She should let us know if she has any black or bloody stools.  Also if she has any vomiting it looks like coffee grounds.

## 2018-03-27 NOTE — Telephone Encounter (Signed)
Patient has been advised

## 2018-03-31 ENCOUNTER — Ambulatory Visit (INDEPENDENT_AMBULATORY_CARE_PROVIDER_SITE_OTHER): Payer: PRIVATE HEALTH INSURANCE | Admitting: Obstetrics & Gynecology

## 2018-03-31 ENCOUNTER — Encounter: Payer: Self-pay | Admitting: Obstetrics & Gynecology

## 2018-03-31 VITALS — BP 127/77 | HR 81 | Ht 64.5 in | Wt 304.0 lb

## 2018-03-31 DIAGNOSIS — Z3040 Encounter for surveillance of contraceptives, unspecified: Secondary | ICD-10-CM

## 2018-03-31 DIAGNOSIS — Z01419 Encounter for gynecological examination (general) (routine) without abnormal findings: Secondary | ICD-10-CM | POA: Diagnosis not present

## 2018-03-31 DIAGNOSIS — Z8742 Personal history of other diseases of the female genital tract: Secondary | ICD-10-CM

## 2018-03-31 DIAGNOSIS — Z3009 Encounter for other general counseling and advice on contraception: Secondary | ICD-10-CM

## 2018-03-31 MED ORDER — NORGESTREL-ETHINYL ESTRADIOL 0.3-30 MG-MCG PO TABS: 1.0000 | ORAL_TABLET | Freq: Every day | ORAL | 12 refills | Status: DC

## 2018-03-31 NOTE — Progress Notes (Signed)
Subjective:     Tina Figueroa is a 37 y.o. female here for a routine exam.  G0 Current complaints: Pt is back in school at [employer] for [employer] in business.  LMP 03/03/2018. cycle length 5-7 days. Not heavy. Sexually active on LoEstrin 1.5/30. Pt recently rescued a stray kitten.   Paternal GM possibly had breast cancer. Late 80's. Pt had Alzheimer's at that time.     Gynecologic History Patient's last menstrual period was 03/03/2018. Contraception: OCP (estrogen/progesterone) Last Pap: 01/03/2017. Results were: normal neg hrHPV Last mammogram: n/a   Obstetric History OB History  Gravida Para Term Preterm AB Living  0 0 0 0 0 0  SAB TAB Ectopic Multiple Live Births  0 0 0 0     The following portions of the patient's history were reviewed and updated as appropriate: allergies, current medications, past family history, past medical history, past social history, past surgical history and problem list.  Review of Systems Pertinent items are noted in HPI.    Objective:  BP 127/77   Pulse 81   Ht 5' 4.5" (1.638 m)   Wt (!) 304 lb (137.9 kg)   LMP 03/03/2018   BMI 51.38 kg/m   General Appearance:    Alert, cooperative, no distress, appears stated age  Head:    Normocephalic, without obvious abnormality, atraumatic  Eyes:    conjunctiva/corneas clear, EOM's intact, both eyes  Ears:    Normal external ear canals, both ears  Nose:   Nares normal, septum midline, mucosa normal, no drainage    or sinus tenderness  Throat:   Lips, mucosa, and tongue normal; teeth and gums normal  Neck:   Supple, symmetrical, trachea midline, no adenopathy;    thyroid:  no enlargement/tenderness/nodules  Back:     Symmetric, no curvature, ROM normal, no CVA tenderness  Lungs:     Clear to auscultation bilaterally, respirations unlabored  Chest Wall:    No tenderness or deformity   Heart:    Regular rate and rhythm, S1 and S2 normal, no murmur, rub   or gallop  Breast Exam:    No tenderness, masses, or nipple  abnormality  Abdomen:     Soft, non-tender, bowel sounds active all four quadrants,    no masses, no organomegaly  Genitalia:    Normal female without lesion, discharge or tenderness     Extremities:   Extremities normal, atraumatic, no cyanosis or edema  Pulses:   2+ and symmetric all extremities  Skin:   Skin color, texture, turgor normal, no rashes or lesions     Assessment:    Healthy female exam.   Not due for a PAP this year On OCPs for contraception and cycle control   Plan:    Contraception: OCP (estrogen/progesterone). Follow up in: 1 year.     Refilled LoEstrin 1.5/30 1 po q day  Ferrell Claiborne L. Harraway-Smith, M.D., Evern Core

## 2018-03-31 NOTE — Patient Instructions (Signed)
Oral Contraception Information Oral contraceptive pills (OCPs) are medicines taken to prevent pregnancy. OCPs work by preventing the ovaries from releasing eggs. The hormones in OCPs also cause the cervical mucus to thicken, preventing the sperm from entering the uterus. The hormones also cause the uterine lining to become thin, not allowing a fertilized egg to attach to the inside of the uterus. OCPs are highly effective when taken exactly as prescribed. However, OCPs do not prevent sexually transmitted diseases (STDs). Safe sex practices, such as using condoms along with the pill, can help prevent STDs. Before taking the pill, you may have a physical exam and Pap test. Your health care provider may order blood tests. The health care provider will make sure you are a good candidate for oral contraception. Discuss with your health care provider the possible side effects of the OCP you may be prescribed. When starting an OCP, it can take 2 to 3 months for the body to adjust to the changes in hormone levels in your body. Types of oral contraception  The combination pill-This pill contains estrogen and progestin (synthetic progesterone) hormones. The combination pill comes in 21-day, 28-day, or 91-day packs. Some types of combination pills are meant to be taken continuously (365-day pills). With 21-day packs, you do not take pills for 7 days after the last pill. With 28-day packs, the pill is taken every day. The last 7 pills are without hormones. Certain types of pills have more than 21 hormone-containing pills. With 91-day packs, the first 84 pills contain both hormones, and the last 7 pills contain no hormones or contain estrogen only.  The minipill-This pill contains the progesterone hormone only. The pill is taken every day continuously. It is very important to take the pill at the same time each day. The minipill comes in packs of 28 pills. All 28 pills contain the hormone. Advantages of oral  contraceptive pills  Decreases premenstrual symptoms.  Treats menstrual period cramps.  Regulates the menstrual cycle.  Decreases a heavy menstrual flow.  May treatacne, depending on the type of pill.  Treats abnormal uterine bleeding.  Treats polycystic ovarian syndrome.  Treats endometriosis.  Can be used as emergency contraception. Things that can make oral contraceptive pills less effective OCPs can be less effective if:  You forget to take the pill at the same time every day.  You have a stomach or intestinal disease that lessens the absorption of the pill.  You take OCPs with other medicines that make OCPs less effective, such as antibiotics, certain HIV medicines, and some seizure medicines.  You take expired OCPs.  You forget to restart the pill on day 7, when using the packs of 21 pills.  Risks associated with oral contraceptive pills Oral contraceptive pills can sometimes cause side effects, such as:  Headache.  Nausea.  Breast tenderness.  Irregular bleeding or spotting.  Combination pills are also associated with a small increased risk of:  Blood clots.  Heart attack.  Stroke.  This information is not intended to replace advice given to you by your health care provider. Make sure you discuss any questions you have with your health care provider. Document Released: 08/25/2002 Document Revised: 11/10/2015 Document Reviewed: 11/23/2012 Elsevier Interactive Patient Education  2018 Elsevier Inc.  

## 2018-04-02 ENCOUNTER — Encounter: Payer: Self-pay | Admitting: Family

## 2018-04-09 ENCOUNTER — Ambulatory Visit (INDEPENDENT_AMBULATORY_CARE_PROVIDER_SITE_OTHER): Payer: PRIVATE HEALTH INSURANCE

## 2018-04-09 DIAGNOSIS — E538 Deficiency of other specified B group vitamins: Secondary | ICD-10-CM | POA: Diagnosis not present

## 2018-04-09 MED ORDER — CYANOCOBALAMIN 1000 MCG/ML IJ SOLN
1000.0000 ug | Freq: Once | INTRAMUSCULAR | Status: AC
  Administered 2018-04-09: 1000 ug via INTRAMUSCULAR

## 2018-04-09 NOTE — Progress Notes (Signed)
Pre visit review using our clinic tool,if applicable. No additional management support is needed unless otherwise documented below in the visit note.   Pt here for monthly B12 injection per order from Sandford Craze, NP  B12 given IM left deltoid,  pt tolerated injection well. No complaints voiced this visit.  Next B12 injection scheduled for 1 month patient aware.

## 2018-04-09 NOTE — Progress Notes (Signed)
Reviewed. ° ° °Rodgerick Gilliand S O'Sullivan NP °

## 2018-04-12 ENCOUNTER — Other Ambulatory Visit: Payer: Self-pay | Admitting: Family

## 2018-05-07 ENCOUNTER — Encounter: Payer: PRIVATE HEALTH INSURANCE | Admitting: Family

## 2018-05-14 ENCOUNTER — Encounter: Payer: Self-pay | Admitting: Family

## 2018-05-14 ENCOUNTER — Ambulatory Visit (INDEPENDENT_AMBULATORY_CARE_PROVIDER_SITE_OTHER): Payer: PRIVATE HEALTH INSURANCE | Admitting: Family

## 2018-05-14 VITALS — BP 130/75 | HR 76 | Temp 98.7°F | Ht 65.0 in | Wt 308.0 lb

## 2018-05-14 DIAGNOSIS — E538 Deficiency of other specified B group vitamins: Secondary | ICD-10-CM | POA: Diagnosis not present

## 2018-05-14 DIAGNOSIS — Z0000 Encounter for general adult medical examination without abnormal findings: Secondary | ICD-10-CM

## 2018-05-14 LAB — URINALYSIS, ROUTINE W REFLEX MICROSCOPIC
Bilirubin Urine: NEGATIVE
Ketones, ur: NEGATIVE
Leukocytes, UA: NEGATIVE
NITRITE: NEGATIVE
RBC / HPF: NONE SEEN (ref 0–?)
Specific Gravity, Urine: 1.025 (ref 1.000–1.030)
Total Protein, Urine: NEGATIVE
Urine Glucose: NEGATIVE
Urobilinogen, UA: 1 (ref 0.0–1.0)
pH: 5.5 (ref 5.0–8.0)

## 2018-05-14 LAB — CBC WITH DIFFERENTIAL/PLATELET
Basophils Absolute: 0 10*3/uL (ref 0.0–0.1)
Basophils Relative: 0.4 % (ref 0.0–3.0)
EOS PCT: 1.8 % (ref 0.0–5.0)
Eosinophils Absolute: 0.2 10*3/uL (ref 0.0–0.7)
HCT: 43.5 % (ref 36.0–46.0)
Hemoglobin: 14.5 g/dL (ref 12.0–15.0)
LYMPHS ABS: 2.3 10*3/uL (ref 0.7–4.0)
Lymphocytes Relative: 26.2 % (ref 12.0–46.0)
MCHC: 33.4 g/dL (ref 30.0–36.0)
MCV: 94.9 fl (ref 78.0–100.0)
MONO ABS: 0.5 10*3/uL (ref 0.1–1.0)
Monocytes Relative: 5.4 % (ref 3.0–12.0)
NEUTROS PCT: 66.2 % (ref 43.0–77.0)
Neutro Abs: 5.8 10*3/uL (ref 1.4–7.7)
Platelets: 294 10*3/uL (ref 150.0–400.0)
RBC: 4.59 Mil/uL (ref 3.87–5.11)
RDW: 12.6 % (ref 11.5–15.5)
WBC: 8.7 10*3/uL (ref 4.0–10.5)

## 2018-05-14 LAB — TSH: TSH: 3.2 u[IU]/mL (ref 0.35–4.50)

## 2018-05-14 LAB — HEPATIC FUNCTION PANEL
ALT: 15 U/L (ref 0–35)
AST: 13 U/L (ref 0–37)
Albumin: 3.8 g/dL (ref 3.5–5.2)
Alkaline Phosphatase: 77 U/L (ref 39–117)
BILIRUBIN TOTAL: 0.4 mg/dL (ref 0.2–1.2)
Bilirubin, Direct: 0.1 mg/dL (ref 0.0–0.3)
Total Protein: 7 g/dL (ref 6.0–8.3)

## 2018-05-14 LAB — BASIC METABOLIC PANEL
BUN: 8 mg/dL (ref 6–23)
CALCIUM: 9 mg/dL (ref 8.4–10.5)
CO2: 24 mEq/L (ref 19–32)
Chloride: 107 mEq/L (ref 96–112)
Creatinine, Ser: 0.81 mg/dL (ref 0.40–1.20)
GFR: 101.27 mL/min (ref >60.00)
GLUCOSE: 95 mg/dL (ref 70–99)
POTASSIUM: 3.9 meq/L (ref 3.5–5.1)
Sodium: 138 mEq/L (ref 135–145)

## 2018-05-14 LAB — LIPID PANEL
CHOLESTEROL: 193 mg/dL (ref 0–200)
HDL: 40.7 mg/dL (ref >39.00)
LDL CALC: 140 mg/dL — AB (ref 0–99)
NonHDL: 152.4
TRIGLYCERIDES: 61 mg/dL (ref 0.0–149.0)
Total CHOL/HDL Ratio: 5
VLDL: 12.2 mg/dL (ref 0.0–40.0)

## 2018-05-14 MED ORDER — CYANOCOBALAMIN 1000 MCG/ML IJ SOLN
1000.0000 ug | Freq: Once | INTRAMUSCULAR | Status: AC
  Administered 2018-05-14: 1000 ug via INTRAMUSCULAR

## 2018-05-14 NOTE — Patient Instructions (Signed)
Please complete lab work prior to leaving. Work on [employer] diet, increasing exercise to 30 minutes five days a week.

## 2018-05-14 NOTE — Addendum Note (Signed)
Addended by: Verdie ShireBAYNES,  M on: 05/14/2018 03:28 PM   Modules accepted: Orders

## 2018-05-14 NOTE — Progress Notes (Signed)
Subjective:    Patient ID: Tina Figueroa, female    DOB: [birth year], 37 y.o.   MRN: [id number]  HPI  Patient presents today for complete physical.  Immunizations: tdap 2013.   Flu shot up to date.  Diet: needs improvement.   Wt Readings from Last 3 Encounters:  05/14/18 (!) 308 lb (139.7 kg)  03/31/18 (!) 304 lb (137.9 kg)  11/01/17 (!) 302 lb 12.8 oz (137.3 kg)  Exercise:  Not exercising Pap Smear: 01/03/17 Vision: last year Dental:  Up to date  Not using cpap due to cost. Plans to get dental appliance once her benefit card funds renew.    Review of Systems  Constitutional: Negative for unexpected weight change.  HENT: Negative for hearing loss and rhinorrhea.   Eyes: Negative for visual disturbance.  Respiratory: Negative for cough and shortness of breath.   Cardiovascular: Negative for leg swelling.  Gastrointestinal: Negative for blood in stool, constipation, diarrhea, nausea and vomiting.  Genitourinary: Negative for dysuria, frequency, hematuria and menstrual problem.  Musculoskeletal: Negative for arthralgias and myalgias.  Skin: Negative for rash.  Neurological: Negative for headaches.  Hematological: Negative for adenopathy.  Psychiatric/Behavioral:       Denies depression/anxiety   Past Medical History:  Diagnosis Date  . B12 deficiency 04/09/2016  . Elevated blood pressure reading without diagnosis of hypertension   . History of chicken pox   . Hypertension   . Mild hyperlipidemia 03/18/2012  . OSA (obstructive sleep apnea) 03/25/2012   Severe OSA per sleep study 10/13.   Marland Kitchen Palpitations   . PCOS (polycystic ovarian syndrome)      Social History   Socioeconomic History  . Marital status: Single    Spouse name: Not on file  . Number of children: 0  . Years of education: Not on file  . Highest education level: Not on file  Occupational History  . Occupation: Chartered certified accountant: [employer]  Social Needs  . Financial resource strain: Not  on file  . Food insecurity:    Worry: Not on file    Inability: Not on file  . Transportation needs:    Medical: Not on file    Non-medical: Not on file  Tobacco Use  . Smoking status: Never Smoker  . Smokeless tobacco: Never Used  Substance and Sexual Activity  . Alcohol use: Yes    Comment: Rarely  . Drug use: No  . Sexual activity: Yes  Lifestyle  . Physical activity:    Days per week: Not on file    Minutes per session: Not on file  . Stress: Not on file  Relationships  . Social connections:    Talks on phone: Not on file    Gets together: Not on file    Attends religious service: Not on file    Active member of club or organization: Not on file    Attends meetings of clubs or organizations: Not on file    Relationship status: Not on file  . Intimate partner violence:    Fear of current or ex partner: Not on file    Emotionally abused: Not on file    Physically abused: Not on file    Forced sexual activity: Not on file  Other Topics Concern  . Not on file  Social History Narrative   Regular exercise;  No   Caffeine Use:  1-2 daily   Works at [employer]   No children   Single  Some college   Plans to return to school in January for medical office administration          Past Surgical History:  Procedure Laterality Date  . ADENOIDECTOMY  1989  . DILATION AND CURETTAGE OF UTERUS    . TONSILLECTOMY    . WISDOM TOOTH EXTRACTION      Family History  Problem Relation Age of Onset  . Heart disease Mother        CHF  . Hypertension Mother   . Depression Mother   . Asthma Mother   . Polycystic ovary syndrome Mother   . Arthritis Father   . Cancer Other        aunt  . Diabetes Maternal Aunt   . Arthritis Maternal Uncle   . Diabetes Paternal Uncle   . Arthritis Maternal Grandmother   . Cancer Maternal Grandmother        lung  . Arthritis Paternal Grandmother   . Cancer Paternal Grandmother        breast  . Stroke Paternal Grandmother   .  Diabetes Paternal Grandmother   . Cancer Paternal Grandfather        colon and prostate  . Cancer Paternal Aunt 20       breast    Allergies  Allergen Reactions  . Flagyl [Metronidazole Hcl]     Chalky and coated tongue  . Metformin And Related Other (See Comments)    Nausea, diarrhea  . Spironolactone     Increased menstrual bleeding.    Current Outpatient Medications on File Prior to Visit  Medication Sig Dispense Refill  . buPROPion (WELLBUTRIN) 75 MG tablet Take 1 tablet twice daily. 180 tablet 1  . escitalopram (LEXAPRO) 20 MG tablet Take 1 tablet (20 mg total) by mouth daily. 90 tablet 1  . famotidine (PEPCID) 20 MG tablet Take 1 tablet (20 mg total) by mouth 2 (two) times daily. 180 tablet 1  . JUNEL 1.5/30 1.5-30 MG-MCG tablet Take 1.5-30 mg by mouth daily.  12  . meloxicam (MOBIC) 7.5 MG tablet TAKE 1 TABLET DAILY 90 tablet 0  . metoprolol succinate (TOPROL-XL) 50 MG 24 hr tablet TAKE 2 TABLETS DAILY WITH OR IMMEDIATELY FOLLOWING A MEAL 180 tablet 1  . Multiple Vitamin (MULTIVITAMIN) tablet Take 1 tablet by mouth daily.    . fluticasone (FLONASE) 50 MCG/ACT nasal spray Place 2 sprays into the nose as needed.     No current facility-administered medications on file prior to visit.     BP 130/75 (BP Location: Right Arm, Patient Position: Sitting, Cuff Size: Large)   Pulse 76   Temp 98.7 F (37.1 C) (Oral)   Ht 5\' 5"  (1.651 m)   Wt (!) 308 lb (139.7 kg)   LMP 05/01/2018 (Exact Date)   SpO2 100%   BMI 51.25 kg/m       Objective:   Physical Exam  Physical Exam  Constitutional: She is oriented to person, place, and time. She appears well-developed and well-nourished. No distress.  HENT:  Head: Normocephalic and atraumatic.  Right Ear: Tympanic membrane and ear canal normal.  Left Ear: Tympanic membrane and ear canal normal.  Mouth/Throat: Oropharynx is clear and moist.  Eyes: Pupils are equal, round, and reactive to light. No scleral icterus.  Neck: Normal  range of motion. No thyromegaly present.  Cardiovascular: Normal rate and regular rhythm.   No murmur heard. Pulmonary/Chest: Effort normal and breath sounds normal. No respiratory distress. He has no wheezes. She has no  rales. She exhibits no tenderness.  Abdominal: Soft. Bowel sounds are normal. She exhibits no distension and no mass. There is no tenderness. There is no rebound and no guarding.  Musculoskeletal: She exhibits no edema.  Lymphadenopathy:    She has no cervical adenopathy.  Neurological: She is alert and oriented to person, place, and time. She has normal patellar reflexes. She exhibits normal muscle tone. Coordination normal.  Skin: Skin is warm and dry.  Psychiatric: She has a normal mood and affect. Her behavior is normal. Judgment and thought content normal.  Breast/pelvic: deferred        Assessment & Plan:  Preventative care- discussed healthy diet, exercise, weight loss.  Tetanus and flu shot up to date.  B12 deficiency- b12 shot today.       Assessment & Plan:

## 2018-06-17 ENCOUNTER — Encounter

## 2018-06-17 ENCOUNTER — Ambulatory Visit (INDEPENDENT_AMBULATORY_CARE_PROVIDER_SITE_OTHER): Payer: PRIVATE HEALTH INSURANCE

## 2018-06-17 DIAGNOSIS — E538 Deficiency of other specified B group vitamins: Secondary | ICD-10-CM | POA: Diagnosis not present

## 2018-06-17 MED ORDER — CYANOCOBALAMIN 1000 MCG/ML IJ SOLN
1000.0000 ug | Freq: Once | INTRAMUSCULAR | Status: AC
  Administered 2018-06-17: 1000 ug via INTRAMUSCULAR

## 2018-06-17 NOTE — Progress Notes (Signed)
Pre visit review using our clinic tool,if applicable. No additional management support is needed unless otherwise documented below in the visit note.   Pt here for monthly B12 injection per order from M. Peggyann Juba'Sullivan, NP.  B12 1000mcg given IM, and pt tolerated injection well.  Next B12 injection scheduled for July 17, 2018

## 2018-06-25 ENCOUNTER — Other Ambulatory Visit: Payer: Self-pay | Admitting: Family

## 2018-07-17 ENCOUNTER — Ambulatory Visit (INDEPENDENT_AMBULATORY_CARE_PROVIDER_SITE_OTHER): Payer: PRIVATE HEALTH INSURANCE | Admitting: *Deleted

## 2018-07-17 DIAGNOSIS — E538 Deficiency of other specified B group vitamins: Secondary | ICD-10-CM | POA: Diagnosis not present

## 2018-07-17 MED ORDER — CYANOCOBALAMIN 1000 MCG/ML IJ SOLN
1000.0000 ug | Freq: Once | INTRAMUSCULAR | Status: AC
  Administered 2018-07-17: 1000 ug via INTRAMUSCULAR

## 2018-07-17 NOTE — Progress Notes (Addendum)
Pt here for monthly B12 injection per order from M. Peggyann Juba, NP.  B12 given IM, and pt tolerated injection well.  Next B12 injection scheduled for 08/19/18

## 2018-07-17 NOTE — Progress Notes (Signed)
Note reviewed and agree.  Ovella Manygoats S O'Sullivan NP 

## 2018-08-19 ENCOUNTER — Ambulatory Visit (INDEPENDENT_AMBULATORY_CARE_PROVIDER_SITE_OTHER): Payer: PRIVATE HEALTH INSURANCE

## 2018-08-19 DIAGNOSIS — E538 Deficiency of other specified B group vitamins: Secondary | ICD-10-CM

## 2018-08-19 MED ORDER — CYANOCOBALAMIN 1000 MCG/ML IJ SOLN
1000.0000 ug | Freq: Once | INTRAMUSCULAR | Status: AC
  Administered 2018-08-19: 1000 ug via INTRAMUSCULAR

## 2018-08-19 NOTE — Progress Notes (Signed)
Pt here for monthly B12 injection per Left deltoid  B12 given IM, and pt tolerated injection well.  Next B12 injection scheduled for next month.

## 2018-08-19 NOTE — Progress Notes (Signed)
Note reviewed.  Verdis Koval S O'Sullivan NP 

## 2018-09-24 ENCOUNTER — Other Ambulatory Visit: Payer: Self-pay

## 2018-09-24 ENCOUNTER — Ambulatory Visit (INDEPENDENT_AMBULATORY_CARE_PROVIDER_SITE_OTHER): Payer: PRIVATE HEALTH INSURANCE

## 2018-09-24 DIAGNOSIS — E538 Deficiency of other specified B group vitamins: Secondary | ICD-10-CM | POA: Diagnosis not present

## 2018-09-24 MED ORDER — CYANOCOBALAMIN 1000 MCG/ML IJ SOLN
1000.0000 ug | Freq: Once | INTRAMUSCULAR | Status: AC
  Administered 2018-09-24: 09:00:00 1000 ug via INTRAMUSCULAR

## 2018-09-24 NOTE — Progress Notes (Signed)
Pre visit review using our clinic tool,if applicable. No additional management support is needed unless otherwise documented below in the visit note.   Pt here for monthly B12 injection per order from M. Peggyann Juba NP.  B12 given IM right deltoid, and pt tolerated injection well. No complaints voiced.    Next B12 injection scheduled for Oct 22, 2018. Patient refused taking p.o. form of B12 so she would not have to come into office.

## 2018-09-24 NOTE — Progress Notes (Signed)
LPN note reviewed and agree.  Veto Macqueen S O'Sullivan NP 

## 2018-10-11 ENCOUNTER — Other Ambulatory Visit: Payer: Self-pay | Admitting: Family

## 2018-10-12 NOTE — Telephone Encounter (Signed)
Please contact pt to schedule OV for follow up,

## 2018-10-17 NOTE — Telephone Encounter (Signed)
Lm for patient to call back and schedule virtual visit 

## 2018-10-18 ENCOUNTER — Encounter: Payer: Self-pay | Admitting: Family

## 2018-10-20 NOTE — Telephone Encounter (Signed)
Patient will keep upcoming appointment for June

## 2018-10-22 ENCOUNTER — Ambulatory Visit (INDEPENDENT_AMBULATORY_CARE_PROVIDER_SITE_OTHER): Payer: PRIVATE HEALTH INSURANCE

## 2018-10-22 ENCOUNTER — Other Ambulatory Visit: Payer: Self-pay

## 2018-10-22 DIAGNOSIS — E538 Deficiency of other specified B group vitamins: Secondary | ICD-10-CM | POA: Diagnosis not present

## 2018-10-22 MED ORDER — CYANOCOBALAMIN 1000 MCG/ML IJ SOLN
1000.0000 ug | Freq: Once | INTRAMUSCULAR | Status: AC
  Administered 2018-10-22: 1000 ug via INTRAMUSCULAR

## 2018-10-22 NOTE — Progress Notes (Signed)
Pre visit review using our clinic tool,if applicable. No additional management support is needed unless otherwise documented below in the visit note.   Pt here for monthly B12 injection per Raquel Sarna   B12 given IM left deltoid, and pt tolerated injection well.  Next B12 injection scheduled for 1 month.

## 2018-10-28 NOTE — Progress Notes (Signed)
Reviewed. ° ° °Lashundra Shiveley S O'Sullivan NP °

## 2018-11-17 ENCOUNTER — Telehealth (INDEPENDENT_AMBULATORY_CARE_PROVIDER_SITE_OTHER): Payer: PRIVATE HEALTH INSURANCE | Admitting: Family

## 2018-11-17 ENCOUNTER — Other Ambulatory Visit: Payer: Self-pay

## 2018-11-17 DIAGNOSIS — F32A Depression, unspecified: Secondary | ICD-10-CM

## 2018-11-17 DIAGNOSIS — I10 Essential (primary) hypertension: Secondary | ICD-10-CM | POA: Diagnosis not present

## 2018-11-17 DIAGNOSIS — E538 Deficiency of other specified B group vitamins: Secondary | ICD-10-CM | POA: Diagnosis not present

## 2018-11-17 DIAGNOSIS — M722 Plantar fascial fibromatosis: Secondary | ICD-10-CM

## 2018-11-17 DIAGNOSIS — F329 Major depressive disorder, single episode, unspecified: Secondary | ICD-10-CM | POA: Diagnosis not present

## 2018-11-17 MED ORDER — METOPROLOL SUCCINATE ER 50 MG PO TB24: ORAL_TABLET | ORAL | 0 refills | Status: DC

## 2018-11-17 MED ORDER — BUPROPION HCL 75 MG PO TABS: ORAL_TABLET | ORAL | 1 refills | Status: DC

## 2018-11-17 MED ORDER — MELOXICAM 7.5 MG PO TABS: 7.5000 mg | ORAL_TABLET | Freq: Every day | ORAL | 1 refills | Status: DC | PRN

## 2018-11-17 MED ORDER — ESCITALOPRAM OXALATE 20 MG PO TABS: 20.0000 mg | ORAL_TABLET | Freq: Every day | ORAL | 1 refills | Status: DC

## 2018-11-17 NOTE — Progress Notes (Signed)
Virtual Visit via Video Note  I connected with Tina Figueroa  on 11/17/18 at  8:20 AM EDT by a video enabled telemedicine application and verified that I am speaking with the correct person using two identifiers. This visit type was conducted due to national recommendations for restrictions regarding the COVID-19 Pandemic (e.g. social distancing).  This format is felt to be most appropriate for this patient at this time.   I discussed the limitations of evaluation and management by telemedicine and the availability of in person appointments. The patient expressed understanding and agreed to proceed.  Only the patient and myself were on today's video visit. The patient was at home and I was at home at the time of today's visit.   History of Present Illness:  Patient is a 38 yr old female who presents today for routine follow up.   Depression- maintained on wellbutrin and lexapro.  GERD- reports that she has changed her diet and has not needed pepcid.   Allergic rhinitis- reports that her allergies "are driving me crazy." Stopped flonase. She is using an allergy relief pill from [facility].  (generic diphenhydramine).  HTN- maintained on metoprolol. Not checking BP at home.  BP Readings from Last 3 Encounters:  05/14/18 130/75  03/31/18 127/77  11/01/17 126/75   Plantar fasciitis- Using meloxicam rarely for foot pain. Reports that she has orthotics and these have been helping her pain significantly.    Observations/Objective:  Gen: Awake, alert, no acute distress Resp: Breathing is even and non-labored Psych: calm/pleasant demeanor Neuro: Alert and Oriented x 3, + facial symmetry, speech is clear.    Assessment and Plan:  HTN- clinically stable on metoprolol. Will have her BP checked at her next b12 nurse visit.  B12 deficiency- continue b12 monthly injections.  Depression- stable on wellbutrin and lexapro.  Seasonal allergies- uncontrolled. Recommend trial of zyrtec in place of  benadryl. If still uncontrolled on zyrtec recommended addition of flonase.  GERD- stable with dietary changes. No longer needing pepcid.  Follow Up Instructions:    I discussed the assessment and treatment plan with the patient. The patient was provided an opportunity to ask questions and all were answered. The patient agreed with the plan and demonstrated an understanding of the instructions.   The patient was advised to call back or seek an in-person evaluation if the symptoms worsen or if the condition fails to improve as anticipated.    [name], NP

## 2018-11-27 ENCOUNTER — Ambulatory Visit (INDEPENDENT_AMBULATORY_CARE_PROVIDER_SITE_OTHER): Payer: PRIVATE HEALTH INSURANCE | Admitting: Family Medicine

## 2018-11-27 ENCOUNTER — Other Ambulatory Visit: Payer: Self-pay

## 2018-11-27 ENCOUNTER — Encounter: Payer: Self-pay | Admitting: Family Medicine

## 2018-11-27 DIAGNOSIS — I10 Essential (primary) hypertension: Secondary | ICD-10-CM

## 2018-11-27 DIAGNOSIS — E538 Deficiency of other specified B group vitamins: Secondary | ICD-10-CM | POA: Diagnosis not present

## 2018-11-27 MED ORDER — CYANOCOBALAMIN 1000 MCG/ML IJ SOLN
1000.0000 ug | Freq: Once | INTRAMUSCULAR | Status: AC
  Administered 2018-11-27: 1000 ug via INTRAMUSCULAR

## 2018-11-27 NOTE — Patient Instructions (Signed)
BP looks good! Continue same medications. Next B12 injection in 1 month.

## 2018-11-27 NOTE — Progress Notes (Signed)
Note reviewed.  Arless Vineyard S O'Sullivan NP 

## 2018-11-27 NOTE — Progress Notes (Signed)
Pt here today for B12 injection and BP check.   Melissa requested BP check because Pt not checking at home. She is currently taking metoprolol 50mg  24hr tablet: 2 tabs daily.   BP in R arm: 128/86 Pulse: 91  Cyanocobalamin 1087mcg/mL 85mL injected into R deltoid. Pt tolerated injection well.   Instructed Pt to continue same medications.   Next B12 injection 12/30/2018.   I have reviewed above and agree- Denny Peon MD

## 2018-12-30 ENCOUNTER — Ambulatory Visit (INDEPENDENT_AMBULATORY_CARE_PROVIDER_SITE_OTHER): Payer: PRIVATE HEALTH INSURANCE

## 2018-12-30 ENCOUNTER — Other Ambulatory Visit: Payer: Self-pay

## 2018-12-30 DIAGNOSIS — E538 Deficiency of other specified B group vitamins: Secondary | ICD-10-CM | POA: Diagnosis not present

## 2018-12-30 MED ORDER — CYANOCOBALAMIN 1000 MCG/ML IJ SOLN
1000.0000 ug | Freq: Once | INTRAMUSCULAR | Status: AC
  Administered 2018-12-30: 1000 ug via INTRAMUSCULAR

## 2018-12-30 NOTE — Progress Notes (Signed)
Note reviewed.  Keyaan Lederman S O'Sullivan NP 

## 2018-12-30 NOTE — Progress Notes (Signed)
Pt here today for B12 injection. Cyanocobalamin 55mL injected into R deltoid. Pt tolerated well.   Next in 4 weeks.

## 2019-01-30 ENCOUNTER — Other Ambulatory Visit: Payer: Self-pay

## 2019-01-30 ENCOUNTER — Ambulatory Visit (INDEPENDENT_AMBULATORY_CARE_PROVIDER_SITE_OTHER): Payer: PRIVATE HEALTH INSURANCE

## 2019-01-30 DIAGNOSIS — E538 Deficiency of other specified B group vitamins: Secondary | ICD-10-CM | POA: Diagnosis not present

## 2019-01-30 MED ORDER — CYANOCOBALAMIN 1000 MCG/ML IJ SOLN
1000.0000 ug | Freq: Once | INTRAMUSCULAR | Status: AC
  Administered 2019-01-30: 1000 ug via INTRAMUSCULAR

## 2019-01-30 NOTE — Progress Notes (Signed)
Pt here for monthly B12 injection per [name]  B12 1058mcg given IM in right deltoid and pt tolerated injection well.  Next B12 injection scheduled for next month.

## 2019-02-06 NOTE — Progress Notes (Signed)
RN note reviewed.   Kavin Weckwerth S O'Sullivan NP 

## 2019-02-27 ENCOUNTER — Encounter: Payer: Self-pay | Admitting: Family

## 2019-02-27 NOTE — Telephone Encounter (Signed)
Melissa could you advise on this medication? I have never heard of it.

## 2019-03-06 ENCOUNTER — Other Ambulatory Visit: Payer: Self-pay

## 2019-03-06 ENCOUNTER — Ambulatory Visit (INDEPENDENT_AMBULATORY_CARE_PROVIDER_SITE_OTHER): Payer: PRIVATE HEALTH INSURANCE | Admitting: *Deleted

## 2019-03-06 DIAGNOSIS — Z23 Encounter for immunization: Secondary | ICD-10-CM

## 2019-03-06 DIAGNOSIS — E538 Deficiency of other specified B group vitamins: Secondary | ICD-10-CM | POA: Diagnosis not present

## 2019-03-06 MED ORDER — CYANOCOBALAMIN 1000 MCG/ML IJ SOLN
1000.0000 ug | Freq: Once | INTRAMUSCULAR | Status: AC
  Administered 2019-03-06: 09:00:00 1000 ug via INTRAMUSCULAR

## 2019-03-06 NOTE — Progress Notes (Signed)
Pt here for monthly B12 injection per [name]  Patient also wants flu vaccine.  Vaccine given and patient tolerate well  B12 1042mcg given IM and pt tolerated injection well.  Next B12 injection scheduled for next month.

## 2019-03-19 ENCOUNTER — Other Ambulatory Visit: Payer: Self-pay | Admitting: Family

## 2019-04-07 ENCOUNTER — Ambulatory Visit (INDEPENDENT_AMBULATORY_CARE_PROVIDER_SITE_OTHER): Payer: PRIVATE HEALTH INSURANCE | Admitting: *Deleted

## 2019-04-07 ENCOUNTER — Other Ambulatory Visit: Payer: Self-pay

## 2019-04-07 DIAGNOSIS — E538 Deficiency of other specified B group vitamins: Secondary | ICD-10-CM | POA: Diagnosis not present

## 2019-04-07 MED ORDER — CYANOCOBALAMIN 1000 MCG/ML IJ SOLN
1000.0000 ug | Freq: Once | INTRAMUSCULAR | Status: AC
  Administered 2019-04-07: 1000 ug via INTRAMUSCULAR

## 2019-04-07 NOTE — Progress Notes (Signed)
Pt here for monthly B12 injection perMelissa O'Sullivan  B12 1021mcg given in right deltoidand pt tolerated injection well.  She has a CPE on 05/11/19 and will get her next injection at that time.

## 2019-05-11 ENCOUNTER — Encounter: Payer: PRIVATE HEALTH INSURANCE | Admitting: Family

## 2019-05-18 ENCOUNTER — Other Ambulatory Visit: Payer: Self-pay

## 2019-05-20 ENCOUNTER — Other Ambulatory Visit: Payer: Self-pay

## 2019-05-20 ENCOUNTER — Encounter: Payer: Self-pay | Admitting: Family

## 2019-05-20 ENCOUNTER — Ambulatory Visit (INDEPENDENT_AMBULATORY_CARE_PROVIDER_SITE_OTHER): Payer: PRIVATE HEALTH INSURANCE | Admitting: Family

## 2019-05-20 VITALS — BP 122/77 | HR 96 | Temp 96.2°F | Resp 16 | Ht 64.5 in | Wt 302.0 lb

## 2019-05-20 DIAGNOSIS — E538 Deficiency of other specified B group vitamins: Secondary | ICD-10-CM

## 2019-05-20 DIAGNOSIS — Z0000 Encounter for general adult medical examination without abnormal findings: Secondary | ICD-10-CM | POA: Diagnosis not present

## 2019-05-20 LAB — LIPID PANEL
Cholesterol: 202 mg/dL — ABNORMAL HIGH (ref 0–200)
HDL: 38.2 mg/dL — ABNORMAL LOW (ref >39.00)
LDL Cholesterol: 153 mg/dL — ABNORMAL HIGH (ref 0–99)
NonHDL: 163.95
Total CHOL/HDL Ratio: 5
Triglycerides: 57 mg/dL (ref 0.0–149.0)
VLDL: 11.4 mg/dL (ref 0.0–40.0)

## 2019-05-20 LAB — BASIC METABOLIC PANEL
BUN: 7 mg/dL (ref 6–23)
CO2: 24 mEq/L (ref 19–32)
Calcium: 8.7 mg/dL (ref 8.4–10.5)
Chloride: 106 mEq/L (ref 96–112)
Creatinine, Ser: 0.79 mg/dL (ref 0.40–1.20)
GFR: 97.56 mL/min (ref >60.00)
Glucose, Bld: 81 mg/dL (ref 70–99)
Potassium: 4.1 mEq/L (ref 3.5–5.1)
Sodium: 139 mEq/L (ref 135–145)

## 2019-05-20 LAB — TSH: TSH: 2.07 u[IU]/mL (ref 0.35–4.50)

## 2019-05-20 LAB — CBC WITH DIFFERENTIAL/PLATELET
Basophils Absolute: 0 10*3/uL (ref 0.0–0.1)
Basophils Relative: 0.5 % (ref 0.0–3.0)
Eosinophils Absolute: 0.2 10*3/uL (ref 0.0–0.7)
Eosinophils Relative: 2.2 % (ref 0.0–5.0)
HCT: 41.1 % (ref 36.0–46.0)
Hemoglobin: 13.8 g/dL (ref 12.0–15.0)
Lymphocytes Relative: 25.8 % (ref 12.0–46.0)
Lymphs Abs: 2.1 10*3/uL (ref 0.7–4.0)
MCHC: 33.7 g/dL (ref 30.0–36.0)
MCV: 94.3 fl (ref 78.0–100.0)
Monocytes Absolute: 0.5 10*3/uL (ref 0.1–1.0)
Monocytes Relative: 5.5 % (ref 3.0–12.0)
Neutro Abs: 5.4 10*3/uL (ref 1.4–7.7)
Neutrophils Relative %: 66 % (ref 43.0–77.0)
Platelets: 282 10*3/uL (ref 150.0–400.0)
RBC: 4.35 Mil/uL (ref 3.87–5.11)
RDW: 12.2 % (ref 11.5–15.5)
WBC: 8.2 10*3/uL (ref 4.0–10.5)

## 2019-05-20 LAB — HEPATIC FUNCTION PANEL
ALT: 14 U/L (ref 0–35)
AST: 13 U/L (ref 0–37)
Albumin: 3.8 g/dL (ref 3.5–5.2)
Alkaline Phosphatase: 83 U/L (ref 39–117)
Bilirubin, Direct: 0.2 mg/dL (ref 0.0–0.3)
Total Bilirubin: 0.8 mg/dL (ref 0.2–1.2)
Total Protein: 6.7 g/dL (ref 6.0–8.3)

## 2019-05-20 LAB — VITAMIN B12: Vitamin B-12: >1500 pg/mL — ABNORMAL HIGH (ref 211–911)

## 2019-05-20 MED ORDER — CYANOCOBALAMIN 1000 MCG/ML IJ SOLN
1000.0000 ug | Freq: Once | INTRAMUSCULAR | Status: AC
  Administered 2019-05-20: 08:00:00 1000 ug via INTRAMUSCULAR

## 2019-05-20 NOTE — Progress Notes (Signed)
Subjective:    Patient ID: Tina Figueroa, female    DOB: [birth year], 38 y.o.   MRN: [id number]  HPI   Patient presents today for complete physical.  Immunizations: flu shot and tetanus shot up to date Diet: trying to eat healthy Wt Readings from Last 3 Encounters:  05/20/19 (!) 302 lb (137 kg)  05/14/18 (!) 308 lb (139.7 kg)  03/31/18 (!) 304 lb (137.9 kg)  Exercise: some walking Pap Smear:  01/03/17 Mammogram: due on 06/07/19    Past Medical History:  Diagnosis Date  . B12 deficiency 04/09/2016  . Elevated blood pressure reading without diagnosis of hypertension   . History of chicken pox   . Hypertension   . Mild hyperlipidemia 03/18/2012  . OSA (obstructive sleep apnea) 03/25/2012   Severe OSA per sleep study 10/13.   Marland Kitchen. Palpitations   . PCOS (polycystic ovarian syndrome)      Social History   Socioeconomic History  . Marital status: Married    Spouse name: Not on file  . Number of children: 0  . Years of education: Not on file  . Highest education level: Not on file  Occupational History  . Occupation: Chartered certified accountantffice Assistant    Employer: [employer]  Social Needs  . Financial resource strain: Not on file  . Food insecurity    Worry: Not on file    Inability: Not on file  . Transportation needs    Medical: Not on file    Non-medical: Not on file  Tobacco Use  . Smoking status: Never Smoker  . Smokeless tobacco: Never Used  Substance and Sexual Activity  . Alcohol use: Yes    Comment: Rarely  . Drug use: No  . Sexual activity: Yes  Lifestyle  . Physical activity    Days per week: Not on file    Minutes per session: Not on file  . Stress: Not on file  Relationships  . Social Musicianconnections    Talks on phone: Not on file    Gets together: Not on file    Attends religious service: Not on file    Active member of club or organization: Not on file    Attends meetings of clubs or organizations: Not on file    Relationship status: Not on file  . Intimate  partner violence    Fear of current or ex partner: Not on file    Emotionally abused: Not on file    Physically abused: Not on file    Forced sexual activity: Not on file  Other Topics Concern  . Not on file  Social History Narrative   Regular exercise;  No   Caffeine Use:  1-2 daily   Works at [employer]   No children   Single   Some college   Plans to return to school in January for medical office administration   Lives with dad and boyfriend and her cat       Past Surgical History:  Procedure Laterality Date  . ADENOIDECTOMY  1989  . DILATION AND CURETTAGE OF UTERUS    . TONSILLECTOMY    . WISDOM TOOTH EXTRACTION      Family History  Problem Relation Age of Onset  . Heart disease Mother        CHF  . Hypertension Mother   . Depression Mother   . Asthma Mother   . Polycystic ovary syndrome Mother   . Arthritis Father   . Cancer Other  aunt  . Diabetes Maternal Aunt   . Arthritis Maternal Uncle   . Diabetes Paternal Uncle   . Arthritis Maternal Grandmother   . Cancer Maternal Grandmother        lung  . Arthritis Paternal Grandmother   . Cancer Paternal Grandmother        breast  . Stroke Paternal Grandmother   . Diabetes Paternal Grandmother   . Cancer Paternal Grandfather        colon and prostate  . Cancer Paternal Aunt 24       breast    Allergies  Allergen Reactions  . Flagyl [Metronidazole Hcl]     Chalky and coated tongue  . Metformin And Related Other (See Comments)    Nausea, diarrhea  . Spironolactone     Increased menstrual bleeding.    Current Outpatient Medications on File Prior to Visit  Medication Sig Dispense Refill  . buPROPion (WELLBUTRIN) 75 MG tablet Take 1 tablet twice daily. 180 tablet 1  . escitalopram (LEXAPRO) 20 MG tablet Take 1 tablet (20 mg total) by mouth daily. 90 tablet 1  . JUNEL 1.5/30 1.5-30 MG-MCG tablet Take 1.5-30 mg by mouth daily.  12  . meloxicam (MOBIC) 7.5 MG tablet Take 1 tablet (7.5 mg  total) by mouth daily as needed for pain. 90 tablet 1  . metoprolol succinate (TOPROL-XL) 50 MG 24 hr tablet TAKE 2 TABLETS DAILY WITH OR IMMEDIATELY FOLLOWING A MEAL 180 tablet 3  . Multiple Vitamin (MULTIVITAMIN) tablet Take 1 tablet by mouth daily.     No current facility-administered medications on file prior to visit.     BP 122/77 (BP Location: Right Arm, Patient Position: Sitting, Cuff Size: Large)   Pulse 96   Temp (!) 96.2 F (35.7 C) (Temporal)   Resp 16   Ht 5' 4.5" (1.638 m)   Wt (!) 302 lb (137 kg)   SpO2 99%   BMI 51.04 kg/m      Review of Systems  Constitutional: Negative for unexpected weight change.  HENT: Negative for hearing loss and rhinorrhea.   Eyes: Negative for visual disturbance.  Respiratory: Negative for cough.   Cardiovascular: Negative for leg swelling.  Gastrointestinal: Negative for constipation and diarrhea.  Genitourinary: Negative for dysuria, frequency and hematuria.  Musculoskeletal: Negative for arthralgias and myalgias.  Skin: Negative for rash.  Neurological: Positive for headaches (last week).  Hematological: Negative for adenopathy.  Psychiatric/Behavioral: Negative for self-injury.       Denies depression/anxiety   Past Medical History:  Diagnosis Date  . B12 deficiency 04/09/2016  . Elevated blood pressure reading without diagnosis of hypertension   . History of chicken pox   . Hypertension   . Mild hyperlipidemia 03/18/2012  . OSA (obstructive sleep apnea) 03/25/2012   Severe OSA per sleep study 10/13.   Marland Kitchen Palpitations   . PCOS (polycystic ovarian syndrome)      Social History   Socioeconomic History  . Marital status: Married    Spouse name: Not on file  . Number of children: 0  . Years of education: Not on file  . Highest education level: Not on file  Occupational History  . Occupation: Chartered certified accountant: [employer]  Social Needs  . Financial resource strain: Not on file  . Food insecurity    Worry:  Not on file    Inability: Not on file  . Transportation needs    Medical: Not on file    Non-medical:  Not on file  Tobacco Use  . Smoking status: Never Smoker  . Smokeless tobacco: Never Used  Substance and Sexual Activity  . Alcohol use: Yes    Comment: Rarely  . Drug use: No  . Sexual activity: Yes  Lifestyle  . Physical activity    Days per week: Not on file    Minutes per session: Not on file  . Stress: Not on file  Relationships  . Social Herbalist on phone: Not on file    Gets together: Not on file    Attends religious service: Not on file    Active member of club or organization: Not on file    Attends meetings of clubs or organizations: Not on file    Relationship status: Not on file  . Intimate partner violence    Fear of current or ex partner: Not on file    Emotionally abused: Not on file    Physically abused: Not on file    Forced sexual activity: Not on file  Other Topics Concern  . Not on file  Social History Narrative   Regular exercise;  No   Caffeine Use:  1-2 daily   Works at [employer]   No children   Single   Some college   Plans to return to school in January for medical office administration   Lives with dad and boyfriend and her cat       Past Surgical History:  Procedure Laterality Date  . ADENOIDECTOMY  1989  . DILATION AND CURETTAGE OF UTERUS    . TONSILLECTOMY    . WISDOM TOOTH EXTRACTION      Family History  Problem Relation Age of Onset  . Heart disease Mother        CHF  . Hypertension Mother   . Depression Mother   . Asthma Mother   . Polycystic ovary syndrome Mother   . Arthritis Father   . Cancer Other        aunt  . Diabetes Maternal Aunt   . Arthritis Maternal Uncle   . Diabetes Paternal Uncle   . Arthritis Maternal Grandmother   . Cancer Maternal Grandmother        lung  . Arthritis Paternal Grandmother   . Cancer Paternal Grandmother        breast  . Stroke Paternal Grandmother   .  Diabetes Paternal Grandmother   . Cancer Paternal Grandfather        colon and prostate  . Cancer Paternal Aunt 33       breast    Allergies  Allergen Reactions  . Flagyl [Metronidazole Hcl]     Chalky and coated tongue  . Metformin And Related Other (See Comments)    Nausea, diarrhea  . Spironolactone     Increased menstrual bleeding.    Current Outpatient Medications on File Prior to Visit  Medication Sig Dispense Refill  . buPROPion (WELLBUTRIN) 75 MG tablet Take 1 tablet twice daily. 180 tablet 1  . escitalopram (LEXAPRO) 20 MG tablet Take 1 tablet (20 mg total) by mouth daily. 90 tablet 1  . JUNEL 1.5/30 1.5-30 MG-MCG tablet Take 1.5-30 mg by mouth daily.  12  . meloxicam (MOBIC) 7.5 MG tablet Take 1 tablet (7.5 mg total) by mouth daily as needed for pain. 90 tablet 1  . metoprolol succinate (TOPROL-XL) 50 MG 24 hr tablet TAKE 2 TABLETS DAILY WITH OR IMMEDIATELY FOLLOWING A MEAL 180 tablet 3  .  Multiple Vitamin (MULTIVITAMIN) tablet Take 1 tablet by mouth daily.     No current facility-administered medications on file prior to visit.     BP 122/77 (BP Location: Right Arm, Patient Position: Sitting, Cuff Size: Large)   Pulse 96   Temp (!) 96.2 F (35.7 C) (Temporal)   Resp 16   Ht 5' 4.5" (1.638 m)   Wt (!) 302 lb (137 kg)   SpO2 99%   BMI 51.04 kg/m        Objective:   Physical Exam  Physical Exam  Constitutional: She is oriented to person, place, and time. She appears well-developed and well-nourished. No distress.  HENT:  Head: Normocephalic and atraumatic.  Right Ear: Tympanic membrane and ear canal normal.  Left Ear: Tympanic membrane and ear canal normal.  Mouth/Throat: not examined, pt wearing mask Eyes: Pupils are equal, round, and reactive to light. No scleral icterus.  Neck: Normal range of motion. No thyromegaly present.  Cardiovascular: Normal rate and regular rhythm.   No murmur heard. Pulmonary/Chest: Effort normal and breath sounds normal.  No respiratory distress. He has no wheezes. She has no rales. She exhibits no tenderness.  Abdominal: Soft. Bowel sounds are normal. She exhibits no distension and no mass. There is no tenderness. There is no rebound and no guarding.  Musculoskeletal: She exhibits no edema.  Lymphadenopathy:    She has no cervical adenopathy.  Neurological: She is alert and oriented to person, place, and time. She has normal patellar reflexes. She exhibits normal muscle tone. Coordination normal.  Skin: Skin is warm and dry.  Psychiatric: She has a normal mood and affect. Her behavior is normal. Judgment and thought content normal.  Breasts: Examined lying Right: Without masses, retractions, discharge or axillary adenopathy.  Left: Without masses, retractions, discharge or axillary adenopathy.     Assessment & Plan:    Preventative care- continue healthy diet, exercise and weight loss. Refer for mammogram.  Pap and immunizations are up to date.   b12 deficiency- continue monthly injections. B12 shot give today. Check follow up b12 level.     Assessment & Plan:

## 2019-05-20 NOTE — Patient Instructions (Signed)
Please continue to work on healthy diet, exercise and weight loss. Complete lab work prior to leaving.  

## 2019-06-08 ENCOUNTER — Other Ambulatory Visit: Payer: Self-pay

## 2019-06-08 MED ORDER — NORETHIN ACE-ETH ESTRAD-FE 1-20 MG-MCG PO TABS: 1.0000 | ORAL_TABLET | Freq: Every day | ORAL | 4 refills | Status: DC

## 2019-06-15 ENCOUNTER — Other Ambulatory Visit: Payer: Self-pay

## 2019-06-15 ENCOUNTER — Ambulatory Visit (HOSPITAL_BASED_OUTPATIENT_CLINIC_OR_DEPARTMENT_OTHER)
Admission: RE | Admit: 2019-06-15 | Discharge: 2019-06-15 | Disposition: A | Discharge status: Home / Self Care | Payer: PRIVATE HEALTH INSURANCE | Source: Ambulatory Visit | Attending: Family | Admitting: Family

## 2019-06-15 DIAGNOSIS — Z1231 Encounter for screening mammogram for malignant neoplasm of breast: Secondary | ICD-10-CM | POA: Diagnosis present

## 2019-06-15 DIAGNOSIS — Z0000 Encounter for general adult medical examination without abnormal findings: Secondary | ICD-10-CM

## 2019-06-18 ENCOUNTER — Encounter: Payer: Self-pay | Admitting: Family

## 2019-07-15 ENCOUNTER — Ambulatory Visit (INDEPENDENT_AMBULATORY_CARE_PROVIDER_SITE_OTHER): Payer: PRIVATE HEALTH INSURANCE

## 2019-07-15 ENCOUNTER — Other Ambulatory Visit: Payer: Self-pay

## 2019-07-15 DIAGNOSIS — E538 Deficiency of other specified B group vitamins: Secondary | ICD-10-CM

## 2019-07-15 MED ORDER — CYANOCOBALAMIN 1000 MCG/ML IJ SOLN
1000.0000 ug | Freq: Once | INTRAMUSCULAR | Status: AC
  Administered 2019-07-15: 1000 ug via INTRAMUSCULAR

## 2019-07-18 NOTE — Progress Notes (Signed)
B12 injection given by CMA.  [name] NP

## 2019-08-19 ENCOUNTER — Other Ambulatory Visit: Payer: Self-pay

## 2019-08-19 ENCOUNTER — Ambulatory Visit (INDEPENDENT_AMBULATORY_CARE_PROVIDER_SITE_OTHER): Payer: PRIVATE HEALTH INSURANCE

## 2019-08-19 DIAGNOSIS — E538 Deficiency of other specified B group vitamins: Secondary | ICD-10-CM

## 2019-08-19 MED ORDER — CYANOCOBALAMIN 1000 MCG/ML IJ SOLN
1000.0000 ug | Freq: Once | INTRAMUSCULAR | Status: AC
  Administered 2019-08-19: 09:00:00 1000 ug via INTRAMUSCULAR

## 2019-08-19 NOTE — Progress Notes (Signed)
Pt here for monthly B12 injection per PCP order.   B12 given IM in left deltoid, tolerated injection well.  Next B12 injection scheduled for one month.   Alysia Penna, RN

## 2019-09-15 ENCOUNTER — Encounter: Payer: Self-pay | Admitting: Family

## 2019-09-23 ENCOUNTER — Other Ambulatory Visit: Payer: Self-pay

## 2019-09-23 ENCOUNTER — Ambulatory Visit (INDEPENDENT_AMBULATORY_CARE_PROVIDER_SITE_OTHER): Payer: PRIVATE HEALTH INSURANCE

## 2019-09-23 ENCOUNTER — Ambulatory Visit: Payer: PRIVATE HEALTH INSURANCE

## 2019-09-23 DIAGNOSIS — E538 Deficiency of other specified B group vitamins: Secondary | ICD-10-CM

## 2019-09-23 MED ORDER — CYANOCOBALAMIN 1000 MCG/ML IJ SOLN
1000.0000 ug | Freq: Once | INTRAMUSCULAR | Status: AC
  Administered 2019-09-23: 1000 ug via INTRAMUSCULAR

## 2019-09-23 NOTE — Progress Notes (Addendum)
Pt here for monthly B12 injection per Sandford Craze FNP.    B12 given IM, and pt tolerated injection well.  Next B12 injection scheduled for 10-22-19.   Reviewed- Sandford Craze NP

## 2019-10-04 ENCOUNTER — Other Ambulatory Visit: Payer: Self-pay | Admitting: Family

## 2019-10-22 ENCOUNTER — Other Ambulatory Visit: Payer: Self-pay

## 2019-10-22 ENCOUNTER — Ambulatory Visit (INDEPENDENT_AMBULATORY_CARE_PROVIDER_SITE_OTHER): Payer: PRIVATE HEALTH INSURANCE

## 2019-10-22 DIAGNOSIS — E538 Deficiency of other specified B group vitamins: Secondary | ICD-10-CM | POA: Diagnosis not present

## 2019-10-22 MED ORDER — CYANOCOBALAMIN 1000 MCG/ML IJ SOLN
1000.0000 ug | Freq: Once | INTRAMUSCULAR | Status: AC
  Administered 2019-10-22: 1000 ug via INTRAMUSCULAR

## 2019-10-22 NOTE — Progress Notes (Addendum)
Pt here for monthly B12 injection per [name]  B12 given IM, Rright deltoid and pt tolerated injection well.  Next B12 injection scheduled for next month.  Nursing note reviewed. Agree with documention and plan.

## 2019-11-20 ENCOUNTER — Ambulatory Visit: Payer: PRIVATE HEALTH INSURANCE | Admitting: Family

## 2019-11-20 ENCOUNTER — Telehealth: Payer: Self-pay | Admitting: Family

## 2019-11-20 ENCOUNTER — Other Ambulatory Visit: Payer: Self-pay

## 2019-11-20 VITALS — BP 122/80 | HR 85 | Temp 96.8°F | Resp 18 | Ht 64.5 in | Wt 315.0 lb

## 2019-11-20 DIAGNOSIS — F329 Major depressive disorder, single episode, unspecified: Secondary | ICD-10-CM | POA: Diagnosis not present

## 2019-11-20 DIAGNOSIS — E538 Deficiency of other specified B group vitamins: Secondary | ICD-10-CM | POA: Diagnosis not present

## 2019-11-20 DIAGNOSIS — G4733 Obstructive sleep apnea (adult) (pediatric): Secondary | ICD-10-CM

## 2019-11-20 DIAGNOSIS — I10 Essential (primary) hypertension: Secondary | ICD-10-CM | POA: Diagnosis not present

## 2019-11-20 DIAGNOSIS — J309 Allergic rhinitis, unspecified: Secondary | ICD-10-CM

## 2019-11-20 DIAGNOSIS — F32A Depression, unspecified: Secondary | ICD-10-CM

## 2019-11-20 MED ORDER — FLUTICASONE PROPIONATE 50 MCG/ACT NA SUSP
2.0000 | Freq: Every day | NASAL | 6 refills | Status: DC
Start: 2019-11-20 — End: 2020-07-20

## 2019-11-20 MED ORDER — ESCITALOPRAM OXALATE 20 MG PO TABS: 20.0000 mg | ORAL_TABLET | Freq: Every day | ORAL | 1 refills | Status: DC

## 2019-11-20 MED ORDER — BUPROPION HCL 75 MG PO TABS: ORAL_TABLET | ORAL | 1 refills | Status: DC

## 2019-11-20 MED ORDER — CYANOCOBALAMIN 1000 MCG/ML IJ SOLN
1000.0000 ug | Freq: Once | INTRAMUSCULAR | Status: AC
  Administered 2019-11-20: 1000 ug via INTRAMUSCULAR

## 2019-11-20 MED ORDER — MONTELUKAST SODIUM 10 MG PO TABS
10.0000 mg | ORAL_TABLET | Freq: Every day | ORAL | 1 refills | Status: DC
Start: 2019-11-20 — End: 2020-06-03

## 2019-11-20 NOTE — Patient Instructions (Signed)
Continue your generic antihistamine. Restart flonase. Start singulair for allergies once daily.

## 2019-11-20 NOTE — Progress Notes (Signed)
Subjective:    Patient ID: Tina Figueroa, female    DOB: 05/06/1981, 39 y.o.   MRN: [id number]  HPI  Patient is a 39 yr old female who presents today for follow up.  B12 deficiency- b12 injection today.   Right leg pain- hit the side of her right lower thigh on a file cabinet about 3 weeks ago. Notes a small bump wear she hit her leg.   HTN- maintained on toprol xl 50mg .  BP Readings from Last 3 Encounters:  11/20/19 122/80  05/20/19 122/77  05/14/18 130/75   Depression- maintained on wellbutrin and lexapro.  Reports mood is good.   OSA- intolerant to cpap and insurance will not cover cpap.  She still has not scheduled an appointment for dental appliacne.   Seasonal allergies- has [employer] brand antihistamine.  No significant improvement, tried flonase, no improvement.     Review of Systems See HPI  Past Medical History:  Diagnosis Date  . B12 deficiency 04/09/2016  . Elevated blood pressure reading without diagnosis of hypertension   . History of chicken pox   . Hypertension   . Mild hyperlipidemia 03/18/2012  . OSA (obstructive sleep apnea) 03/25/2012   Severe OSA per sleep study 10/13.   11/13 Palpitations   . PCOS (polycystic ovarian syndrome)      Social History   Socioeconomic History  . Marital status: Married    Spouse name: Not on file  . Number of children: 0  . Years of education: Not on file  . Highest education level: Not on file  Occupational History  . Occupation: [employer]: [employer]  Tobacco Use  . Smoking status: Never Smoker  . Smokeless tobacco: Never Used  Substance and Sexual Activity  . Alcohol use: Yes    Comment: Rarely  . Drug use: No  . Sexual activity: Yes    Partners: Male  Other Topics Concern  . 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 Difficulty of Paying Living Expenses:   Food Insecurity:   . Worried About Corporate investment banker in the Last Year:   . Programme researcher, broadcasting/film/video in the Last Year:   Transportation Needs:   . Barista (Medical):   Freight forwarder Lack of Transportation (Non-Medical):   Physical Activity:   . Days of Exercise per Week:   . Minutes of Exercise per Session:   Stress:   . Feeling of Stress :   Social Connections:   . Frequency of Communication with Friends and Family:   . Frequency of Social Gatherings with Friends and Family:   . Attends Religious Services:   . Active Member of Clubs or Organizations:   . Attends [employer] Meetings:   Banker Marital Status:   Intimate Partner Violence:   . Fear of Current or Ex-Partner:   . Emotionally Abused:   Marland Kitchen Physically Abused:   . Sexually Abused:     Past Surgical History:  Procedure Laterality Date  . ADENOIDECTOMY  1989  . DILATION AND  CURETTAGE OF UTERUS    . TONSILLECTOMY    . WISDOM TOOTH EXTRACTION      Family History  Problem Relation Age of Onset  . Heart disease Mother        CHF  . Hypertension Mother   . Depression Mother   . Asthma Mother   . Polycystic ovary syndrome Mother   . Arthritis Father   . Cancer Other        aunt  . Diabetes Maternal Aunt   . Arthritis Maternal Uncle   . Diabetes Paternal Uncle   . Arthritis Maternal Grandmother   . Cancer Maternal Grandmother        lung  . Arthritis Paternal Grandmother   . Cancer Paternal Grandmother        breast  . Stroke Paternal Grandmother   . Diabetes Paternal Grandmother   . Cancer Paternal Grandfather        colon and prostate  . Cancer Paternal Aunt 67       breast    Allergies  Allergen Reactions  . Flagyl [Metronidazole Hcl]     Chalky and coated tongue  . Metformin And Related Other  (See Comments)    Nausea, diarrhea  . Spironolactone     Increased menstrual bleeding.    Current Outpatient Medications on File Prior to Visit  Medication Sig Dispense Refill  . buPROPion (WELLBUTRIN) 75 MG tablet Take 1 tablet twice daily. 180 tablet 1  . escitalopram (LEXAPRO) 20 MG tablet Take 1 tablet (20 mg total) by mouth daily. 90 tablet 1  . JUNEL 1.5/30 1.5-30 MG-MCG tablet Take 1.5-30 mg by mouth daily.  12  . meloxicam (MOBIC) 7.5 MG tablet TAKE 1 TABLET DAILY 90 tablet 3  . metoprolol succinate (TOPROL-XL) 50 MG 24 hr tablet TAKE 2 TABLETS DAILY WITH OR IMMEDIATELY FOLLOWING A MEAL 180 tablet 3  . Multiple Vitamin (MULTIVITAMIN) tablet Take 1 tablet by mouth daily.    . norethindrone-ethinyl estradiol (JUNEL FE 1/20) 1-20 MG-MCG tablet Take 1 tablet by mouth daily. 3 Package 4   No current facility-administered medications on file prior to visit.    BP 122/80 (BP Location: Right Arm, Patient Position: Sitting, Cuff Size: Large)   Pulse 85   Temp (!) 96.8 F (36 C) (Temporal)   Resp 18   Ht 5' 4.5" (1.638 m)   Wt (!) 315 lb (142.9 kg)   SpO2 97%   BMI 53.23 kg/m       Objective:   Physical Exam Constitutional:      Appearance: She is well-developed.  Cardiovascular:     Rate and Rhythm: Normal rate and regular rhythm.     Heart sounds: Normal heart sounds. No murmur.  Pulmonary:     Effort: Pulmonary effort is normal. No respiratory distress.     Breath sounds: Normal breath sounds. No wheezing.  Musculoskeletal:     Comments: + pea sized, slightly tender hematoma right lower lateral thigh  Psychiatric:        Behavior: Behavior normal.        Thought Content: Thought content normal.        Judgment: Judgment normal.           Assessment & Plan:  HTN- bp stable on metoprolol. Continue same.  OSA- Insurance does not cover cpap. See mychart message to pt 6/4- will try to reinitiate referral to orthodontist for oral appliance.  Hematoma- reassured  pt that this should continue to improve in  the coming weeks.  Allergic rhinitis- uncontrolled. Will give trial of singulair.  Depression- stable on current meds. Continue same.  b12 deficiency- continue monthly injections.    This visit occurred during the SARS-CoV-2 public health emergency.  Safety protocols were in place, including screening questions prior to the visit, additional usage of staff PPE, and extensive cleaning of exam room while observing appropriate contact time as indicated for disinfecting solutions.

## 2019-11-20 NOTE — Telephone Encounter (Signed)
See mychart.  

## 2019-11-25 NOTE — Addendum Note (Signed)
Addended by: Sandford Craze on: 11/25/2019 07:08 AM   Modules accepted: Orders

## 2019-12-25 ENCOUNTER — Other Ambulatory Visit: Payer: Self-pay

## 2019-12-25 ENCOUNTER — Ambulatory Visit (INDEPENDENT_AMBULATORY_CARE_PROVIDER_SITE_OTHER): Payer: PRIVATE HEALTH INSURANCE

## 2019-12-25 DIAGNOSIS — E538 Deficiency of other specified B group vitamins: Secondary | ICD-10-CM | POA: Diagnosis not present

## 2019-12-25 MED ORDER — CYANOCOBALAMIN 1000 MCG/ML IJ SOLN
1000.0000 ug | INTRAMUSCULAR | Status: DC
  Administered 2019-12-25 – 2022-02-16 (×9): 1000 ug via INTRAMUSCULAR

## 2019-12-25 NOTE — Progress Notes (Signed)
Pt here for monthly B12 injection per [name]  B12 given IM, left deltoid and pt tolerated injection well.  Next B12 injection scheduled for next month.

## 2020-01-29 ENCOUNTER — Other Ambulatory Visit: Payer: Self-pay

## 2020-01-29 ENCOUNTER — Ambulatory Visit (INDEPENDENT_AMBULATORY_CARE_PROVIDER_SITE_OTHER): Payer: PRIVATE HEALTH INSURANCE

## 2020-01-29 DIAGNOSIS — E538 Deficiency of other specified B group vitamins: Secondary | ICD-10-CM

## 2020-01-29 MED ORDER — CYANOCOBALAMIN 1000 MCG/ML IJ SOLN
1000.0000 ug | Freq: Once | INTRAMUSCULAR | Status: AC
  Administered 2020-01-29: 1000 ug via INTRAMUSCULAR

## 2020-01-29 NOTE — Progress Notes (Addendum)
Pt here for monthly B12 injection per Sandford Craze  B12 given IM in left deltoid, and pt tolerated injection well.  Next B12 injection scheduled for next month.   Reviewed. Jilda Roche Wendling 2:41 PM 01/29/20

## 2020-02-08 ENCOUNTER — Encounter: Payer: Self-pay | Admitting: Family

## 2020-02-08 DIAGNOSIS — K219 Gastro-esophageal reflux disease without esophagitis: Secondary | ICD-10-CM

## 2020-02-21 ENCOUNTER — Other Ambulatory Visit: Payer: Self-pay | Admitting: Family

## 2020-03-01 ENCOUNTER — Other Ambulatory Visit: Payer: Self-pay

## 2020-03-01 ENCOUNTER — Ambulatory Visit (INDEPENDENT_AMBULATORY_CARE_PROVIDER_SITE_OTHER): Payer: PRIVATE HEALTH INSURANCE

## 2020-03-01 DIAGNOSIS — E538 Deficiency of other specified B group vitamins: Secondary | ICD-10-CM

## 2020-03-01 DIAGNOSIS — Z23 Encounter for immunization: Secondary | ICD-10-CM

## 2020-03-01 MED ORDER — CYANOCOBALAMIN 1000 MCG/ML IJ SOLN
1000.0000 ug | Freq: Once | INTRAMUSCULAR | Status: AC
  Administered 2020-03-01: 1000 ug via INTRAMUSCULAR

## 2020-03-01 NOTE — Progress Notes (Signed)
Pt here for monthly B12 injection per Sandford Craze  B12 given IM left deltoid, and pt tolerated injection well.  Next B12 injection scheduled for Oct 15th  Patient also given flu vaccine in right deltoid IM. 0.5 mL given. Patient tolerated well.

## 2020-04-01 ENCOUNTER — Other Ambulatory Visit: Payer: Self-pay

## 2020-04-01 ENCOUNTER — Ambulatory Visit (INDEPENDENT_AMBULATORY_CARE_PROVIDER_SITE_OTHER): Payer: PRIVATE HEALTH INSURANCE

## 2020-04-01 DIAGNOSIS — E538 Deficiency of other specified B group vitamins: Secondary | ICD-10-CM

## 2020-04-01 MED ORDER — CYANOCOBALAMIN 1000 MCG/ML IJ SOLN
1000.0000 ug | Freq: Once | INTRAMUSCULAR | Status: AC
Start: 2020-04-01 — End: 2020-04-01
  Administered 2020-04-01: 1000 ug via INTRAMUSCULAR

## 2020-04-01 NOTE — Progress Notes (Signed)
Pt here for monthly B12 injection per Sandford Craze  B12 given IM right deltoid, and pt tolerated injection well.  Next B12 injection scheduled for next month.

## 2020-05-03 ENCOUNTER — Ambulatory Visit (INDEPENDENT_AMBULATORY_CARE_PROVIDER_SITE_OTHER): Payer: PRIVATE HEALTH INSURANCE

## 2020-05-03 ENCOUNTER — Other Ambulatory Visit: Payer: Self-pay

## 2020-05-03 DIAGNOSIS — E538 Deficiency of other specified B group vitamins: Secondary | ICD-10-CM

## 2020-05-03 MED ORDER — CYANOCOBALAMIN 1000 MCG/ML IJ SOLN
1000.0000 ug | Freq: Once | INTRAMUSCULAR | Status: AC
Start: 2020-05-03 — End: 2020-05-03
  Administered 2020-05-03: 1000 ug via INTRAMUSCULAR

## 2020-05-03 NOTE — Progress Notes (Signed)
Pt here for monthly B12 injection per Sandford Craze  B12 given IM left deltoid, and pt tolerated injection well.  Next B12 injection scheduled for next month at ov with pcp.

## 2020-06-03 ENCOUNTER — Ambulatory Visit (INDEPENDENT_AMBULATORY_CARE_PROVIDER_SITE_OTHER): Payer: PRIVATE HEALTH INSURANCE | Admitting: Family

## 2020-06-03 ENCOUNTER — Encounter: Payer: Self-pay | Admitting: Family

## 2020-06-03 ENCOUNTER — Other Ambulatory Visit: Payer: Self-pay

## 2020-06-03 VITALS — BP 138/88 | HR 94 | Resp 18 | Ht 64.5 in | Wt 312.0 lb

## 2020-06-03 DIAGNOSIS — I10 Essential (primary) hypertension: Secondary | ICD-10-CM | POA: Diagnosis not present

## 2020-06-03 DIAGNOSIS — J309 Allergic rhinitis, unspecified: Secondary | ICD-10-CM

## 2020-06-03 DIAGNOSIS — F32A Depression, unspecified: Secondary | ICD-10-CM

## 2020-06-03 DIAGNOSIS — E538 Deficiency of other specified B group vitamins: Secondary | ICD-10-CM | POA: Diagnosis not present

## 2020-06-03 LAB — BASIC METABOLIC PANEL
BUN: 7 mg/dL (ref 6–23)
CO2: 24 mEq/L (ref 19–32)
Calcium: 9 mg/dL (ref 8.4–10.5)
Chloride: 106 mEq/L (ref 96–112)
Creatinine, Ser: 0.84 mg/dL (ref 0.40–1.20)
GFR: 86.58 mL/min (ref >60.00)
Glucose, Bld: 89 mg/dL (ref 70–99)
Potassium: 4 mEq/L (ref 3.5–5.1)
Sodium: 139 mEq/L (ref 135–145)

## 2020-06-03 MED ORDER — CYANOCOBALAMIN 1000 MCG/ML IJ SOLN
1000.0000 ug | Freq: Once | INTRAMUSCULAR | Status: AC
  Administered 2020-06-03: 08:00:00 1000 ug via INTRAMUSCULAR

## 2020-06-03 MED ORDER — METOPROLOL SUCCINATE ER 50 MG PO TB24: ORAL_TABLET | ORAL | 1 refills | Status: DC

## 2020-06-03 MED ORDER — ESCITALOPRAM OXALATE 20 MG PO TABS: 20.0000 mg | ORAL_TABLET | Freq: Every day | ORAL | 1 refills | Status: DC

## 2020-06-03 MED ORDER — BUPROPION HCL ER (XL) 150 MG PO TB24: 150.0000 mg | ORAL_TABLET | Freq: Every day | ORAL | 1 refills | Status: DC

## 2020-06-03 MED ORDER — MONTELUKAST SODIUM 10 MG PO TABS: 10.0000 mg | ORAL_TABLET | Freq: Every day | ORAL | 1 refills | Status: DC

## 2020-06-03 NOTE — Patient Instructions (Signed)
Please complete lab work prior to leaving.   

## 2020-06-03 NOTE — Progress Notes (Signed)
Subjective:    Patient ID: Tina Figueroa, female    DOB: [birth year], 39 y.o.   MRN: [id number]  HPI   Patient is a 39 yr old female who presents today for follow up.   HTN- maintained on toprol xl 50mg  once daily.   BP Readings from Last 3 Encounters:  06/03/20 138/88  11/20/19 122/80  05/20/19 122/77   Depression- lexapro 20mg  and wellbutrin 75mg  bid.   Allergic rrhinitis, flonase singulair 10mg , zyrtec 10mg  once daily.   Using meloxicam daily.  She stopped every day for October and November an then she had increased foot pain. Tylenol does not help.   B12 deficiency- due for b12 injection today.   Review of Systems See HPI  Past Medical History:  Diagnosis Date  . B12 deficiency 04/09/2016  . Elevated blood pressure reading without diagnosis of hypertension   . History of chicken pox   . Hypertension   . Mild hyperlipidemia 03/18/2012  . OSA (obstructive sleep apnea) 03/25/2012   Severe OSA per sleep study 10/13.   December Palpitations   . PCOS (polycystic ovarian syndrome)      Social History   Socioeconomic History  . Marital status: Married    Spouse name: Not on file  . Number of children: 0  . Years of education: Not on file  . Highest education level: Not on file  Occupational History  . Occupation: 04/11/2016: [employer]  Tobacco Use  . Smoking status: Never Smoker  . Smokeless tobacco: Never Used  Substance and Sexual Activity  . Alcohol use: Yes    Comment: Rarely  . Drug use: No  . Sexual activity: Yes    Partners: Male  Other Topics Concern  . 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 ADENOIDECTOMY  1989  . DILATION AND CURETTAGE OF UTERUS    . TONSILLECTOMY    . WISDOM TOOTH EXTRACTION      Family History  Problem Relation Age of Onset  . Heart disease Mother        CHF  . Hypertension Mother   . Depression Mother   . Asthma Mother   . Polycystic ovary syndrome Mother   . Arthritis Father   . Cancer Other        aunt  . Diabetes Maternal Aunt   . Arthritis Maternal Uncle   . Diabetes Paternal Uncle   . Arthritis Maternal Grandmother   . Cancer Maternal Grandmother        lung  . Arthritis Paternal Grandmother   . Cancer Paternal Grandmother        breast  . Stroke Paternal Grandmother   . Diabetes Paternal Grandmother   . Cancer Paternal Grandfather  colon and prostate  . Cancer Paternal Aunt 80       breast    Allergies  Allergen Reactions  . Flagyl [Metronidazole Hcl]     Chalky and coated tongue  . Metformin And Related Other (See Comments)    Nausea, diarrhea  . Spironolactone     Increased menstrual bleeding.    Current Outpatient Medications on File Prior to Visit  Medication Sig Dispense Refill  . escitalopram (LEXAPRO) 20 MG tablet Take 1 tablet (20 mg total) by mouth daily. 90 tablet 1  . fluticasone (FLONASE) 50 MCG/ACT nasal spray Place 2 sprays into both nostrils daily. 16 g 6  . JUNEL 1.5/30 1.5-30 MG-MCG tablet Take 1.5-30 mg by mouth daily.  12  . meloxicam (MOBIC) 7.5 MG tablet TAKE 1 TABLET DAILY 90 tablet 3  . metoprolol succinate (TOPROL-XL) 50 MG 24 hr tablet TAKE 2 TABLETS DAILY WITH OR IMMEDIATELY FOLLOWING A MEAL 180 tablet 1  . montelukast (SINGULAIR) 10 MG tablet Take 1 tablet (10 mg total) by mouth at bedtime. 90 tablet 1  . Multiple Vitamin (MULTIVITAMIN) tablet  Take 1 tablet by mouth daily.    . norethindrone-ethinyl estradiol (JUNEL FE 1/20) 1-20 MG-MCG tablet Take 1 tablet by mouth daily. 3 Package 4   Current Facility-Administered Medications on File Prior to Visit  Medication Dose Route Frequency Provider Last Rate Last Admin  . cyanocobalamin ((VITAMIN B-12)) injection 1,000 mcg  1,000 mcg Intramuscular Q30 days Sandford Craze, NP   1,000 mcg at 12/25/19 0848    BP 138/88 (BP Location: Right Arm, Patient Position: Sitting, Cuff Size: Large)   Pulse 94   Resp 18   Ht 5' 4.5" (1.638 m)   Wt (!) 312 lb (141.5 kg)   SpO2 98%   BMI 52.73 kg/m       Objective:   Physical Exam Constitutional:      Appearance: She is well-developed and well-nourished.  Cardiovascular:     Rate and Rhythm: Normal rate and regular rhythm.     Heart sounds: Normal heart sounds. No murmur heard.   Pulmonary:     Effort: Pulmonary effort is normal. No respiratory distress.     Breath sounds: Normal breath sounds. No wheezing.  Psychiatric:        Mood and Affect: Mood and affect normal.        Behavior: Behavior normal.        Thought Content: Thought content normal.        Judgment: Judgment normal.           Assessment & Plan:  HTN- bp stable. Continue Toprol xl 50mg  once daily.  Depression- stable on wellbutrin (will change to 150mg  XL for ease of adminstration) and lexapro   b12 deficiency- continue monthly b12 injections.   Foot pain- stable with prn of meloxicam 7.5mg  once daily. Continue same.  Allergic rhinitis- stable. Continue  flonase singulair 10mg , zyrtec 10mg  once daily.    This visit occurred during the SARS-CoV-2 public health emergency.  Safety protocols were in place, including screening questions prior to the visit, additional usage of staff PPE, and extensive cleaning of exam room while observing appropriate contact time as indicated for disinfecting solutions.

## 2020-06-09 ENCOUNTER — Other Ambulatory Visit: Payer: Self-pay | Admitting: *Deleted

## 2020-06-09 MED ORDER — METOPROLOL SUCCINATE ER 50 MG PO TB24: 50.0000 mg | ORAL_TABLET | Freq: Every day | ORAL | 1 refills | Status: DC

## 2020-06-16 ENCOUNTER — Other Ambulatory Visit: Payer: Self-pay | Admitting: *Deleted

## 2020-06-16 ENCOUNTER — Encounter: Payer: Self-pay | Admitting: Family

## 2020-06-16 MED ORDER — METOPROLOL SUCCINATE ER 50 MG PO TB24: 50.0000 mg | ORAL_TABLET | Freq: Every day | ORAL | 1 refills | Status: DC

## 2020-06-20 NOTE — Telephone Encounter (Signed)
See attachment pt sent. Could you please call [facility] and see what additional information they are requiring? tks

## 2020-07-05 ENCOUNTER — Ambulatory Visit: Payer: PRIVATE HEALTH INSURANCE

## 2020-07-08 ENCOUNTER — Other Ambulatory Visit: Payer: Self-pay

## 2020-07-08 ENCOUNTER — Ambulatory Visit (INDEPENDENT_AMBULATORY_CARE_PROVIDER_SITE_OTHER): Payer: PRIVATE HEALTH INSURANCE

## 2020-07-08 DIAGNOSIS — E538 Deficiency of other specified B group vitamins: Secondary | ICD-10-CM

## 2020-07-08 MED ORDER — CYANOCOBALAMIN 1000 MCG/ML IJ SOLN
1000.0000 ug | Freq: Once | INTRAMUSCULAR | Status: AC
  Administered 2020-07-08: 1000 ug via INTRAMUSCULAR

## 2020-07-08 NOTE — Progress Notes (Signed)
Pt here for monthly B12 injection per Sandford Craze  B12 given IM, and pt tolerated injection well.  Next B12 injection scheduled for next month.   Routed to DOD in absence of PCP

## 2020-07-13 ENCOUNTER — Telehealth (INDEPENDENT_AMBULATORY_CARE_PROVIDER_SITE_OTHER): Payer: PRIVATE HEALTH INSURANCE | Admitting: Family

## 2020-07-13 ENCOUNTER — Encounter: Payer: Self-pay | Admitting: Family

## 2020-07-13 DIAGNOSIS — R110 Nausea: Secondary | ICD-10-CM | POA: Diagnosis not present

## 2020-07-13 DIAGNOSIS — U071 COVID-19: Secondary | ICD-10-CM

## 2020-07-13 MED ORDER — ONDANSETRON HCL 4 MG PO TABS: 4.0000 mg | ORAL_TABLET | Freq: Three times a day (TID) | ORAL | 0 refills | Status: DC | PRN

## 2020-07-13 NOTE — Telephone Encounter (Signed)
CMA will reach out to try to schedule an VOV today.

## 2020-07-13 NOTE — Progress Notes (Signed)
Virtual Visit via Video Note  I connected with Tina Figueroa on 07/13/20 at 11:40 AM EST by a video enabled telemedicine application and verified that I am speaking with the correct person using two identifiers.  Location: Patient: home Provider: home   I discussed the limitations of evaluation and management by telemedicine and the availability of in person appointments. The patient expressed understanding and agreed to proceed. Only the patient and myself were present for today's video call.   History of Present Illness:  She developed symptoms began on 1/10 with abdominal pain.  She then developed N/V/D, nasal congestion.  Rapid test was negative so they sent out the PCR test.  PCR test was positive.  Returned to work on 1/20.  Stayed home the 21st because she was really tired. She returned on Monday 1/24.  Yesterday she had N/V.  Today she has been able to eat some crackers and gatorade.  Reports that she has a heaviness with her breathing.   She denies current abdominal pain.     Observations/Objective:   Gen: Awake, alert, no acute distress Resp: Breathing is even and non-labored Psych: calm/pleasant demeanor Neuro: Alert and Oriented x 3, + facial symmetry, speech is clear.   Assessment and Plan:  COVID-19 infection- we discussed supportive care measures and I have written her out of work until 07/18/20.  I think that she is just slowly improving from her covid-19 infection.   Nausea- will rx with zofran.  Pt is advised to go to the ER if she is unable to keep down food/liquid, severe/recurrent abdominal pain.   Follow Up Instructions:    I discussed the assessment and treatment plan with the patient. The patient was provided an opportunity to ask questions and all were answered. The patient agreed with the plan and demonstrated an understanding of the instructions.   The patient was advised to call back or seek an in-person evaluation if the symptoms worsen or if the  condition fails to improve as anticipated.  [name], NP

## 2020-07-14 ENCOUNTER — Encounter (HOSPITAL_BASED_OUTPATIENT_CLINIC_OR_DEPARTMENT_OTHER): Payer: Self-pay

## 2020-07-14 ENCOUNTER — Emergency Department (HOSPITAL_BASED_OUTPATIENT_CLINIC_OR_DEPARTMENT_OTHER): Payer: PRIVATE HEALTH INSURANCE

## 2020-07-14 ENCOUNTER — Inpatient Hospital Stay (HOSPITAL_BASED_OUTPATIENT_CLINIC_OR_DEPARTMENT_OTHER)
Admission: EM | Admit: 2020-07-14 | Discharge: 2020-07-20 | DRG: 418 | Disposition: A | Discharge status: Home / Self Care | Payer: PRIVATE HEALTH INSURANCE | Attending: Surgery | Admitting: Surgery

## 2020-07-14 ENCOUNTER — Other Ambulatory Visit: Payer: Self-pay

## 2020-07-14 DIAGNOSIS — Z419 Encounter for procedure for purposes other than remedying health state, unspecified: Secondary | ICD-10-CM

## 2020-07-14 DIAGNOSIS — K8043 Calculus of bile duct with acute cholecystitis with obstruction: Secondary | ICD-10-CM

## 2020-07-14 DIAGNOSIS — G4733 Obstructive sleep apnea (adult) (pediatric): Secondary | ICD-10-CM | POA: Diagnosis present

## 2020-07-14 DIAGNOSIS — Z8249 Family history of ischemic heart disease and other diseases of the circulatory system: Secondary | ICD-10-CM

## 2020-07-14 DIAGNOSIS — E282 Polycystic ovarian syndrome: Secondary | ICD-10-CM | POA: Diagnosis not present

## 2020-07-14 DIAGNOSIS — K8062 Calculus of gallbladder and bile duct with acute cholecystitis without obstruction: Secondary | ICD-10-CM | POA: Diagnosis not present

## 2020-07-14 DIAGNOSIS — I10 Essential (primary) hypertension: Secondary | ICD-10-CM | POA: Diagnosis present

## 2020-07-14 DIAGNOSIS — K8042 Calculus of bile duct with acute cholecystitis without obstruction: Secondary | ICD-10-CM

## 2020-07-14 DIAGNOSIS — K2970 Gastritis, unspecified, without bleeding: Secondary | ICD-10-CM | POA: Diagnosis not present

## 2020-07-14 DIAGNOSIS — Z793 Long term (current) use of hormonal contraceptives: Secondary | ICD-10-CM

## 2020-07-14 DIAGNOSIS — R748 Abnormal levels of other serum enzymes: Secondary | ICD-10-CM | POA: Insufficient documentation

## 2020-07-14 DIAGNOSIS — Z825 Family history of asthma and other chronic lower respiratory diseases: Secondary | ICD-10-CM | POA: Diagnosis not present

## 2020-07-14 DIAGNOSIS — F32A Depression, unspecified: Secondary | ICD-10-CM | POA: Diagnosis present

## 2020-07-14 DIAGNOSIS — Z79899 Other long term (current) drug therapy: Secondary | ICD-10-CM

## 2020-07-14 DIAGNOSIS — Z833 Family history of diabetes mellitus: Secondary | ICD-10-CM | POA: Diagnosis not present

## 2020-07-14 DIAGNOSIS — K8050 Calculus of bile duct without cholangitis or cholecystitis without obstruction: Secondary | ICD-10-CM

## 2020-07-14 DIAGNOSIS — Z8261 Family history of arthritis: Secondary | ICD-10-CM | POA: Diagnosis not present

## 2020-07-14 DIAGNOSIS — Z8616 Personal history of COVID-19: Secondary | ICD-10-CM

## 2020-07-14 DIAGNOSIS — U071 COVID-19: Secondary | ICD-10-CM | POA: Diagnosis not present

## 2020-07-14 DIAGNOSIS — Z6841 Body Mass Index (BMI) 40.0 and over, adult: Secondary | ICD-10-CM

## 2020-07-14 DIAGNOSIS — K8000 Calculus of gallbladder with acute cholecystitis without obstruction: Secondary | ICD-10-CM | POA: Diagnosis present

## 2020-07-14 LAB — CBC WITH DIFFERENTIAL/PLATELET
Abs Immature Granulocytes: 0.05 10*3/uL (ref 0.00–0.07)
Basophils Absolute: 0 10*3/uL (ref 0.0–0.1)
Basophils Relative: 0 %
Eosinophils Absolute: 0.1 10*3/uL (ref 0.0–0.5)
Eosinophils Relative: 1 %
HCT: 44.4 % (ref 36.0–46.0)
Hemoglobin: 15.2 g/dL — ABNORMAL HIGH (ref 12.0–15.0)
Immature Granulocytes: 0 %
Lymphocytes Relative: 10 %
Lymphs Abs: 1.2 10*3/uL (ref 0.7–4.0)
MCH: 31.9 pg (ref 26.0–34.0)
MCHC: 34.2 g/dL (ref 30.0–36.0)
MCV: 93.3 fL (ref 80.0–100.0)
Monocytes Absolute: 0.8 10*3/uL (ref 0.1–1.0)
Monocytes Relative: 6 %
Neutro Abs: 10.4 10*3/uL — ABNORMAL HIGH (ref 1.7–7.7)
Neutrophils Relative %: 83 %
Platelets: 323 10*3/uL (ref 150–400)
RBC: 4.76 MIL/uL (ref 3.87–5.11)
RDW: 11.9 % (ref 11.5–15.5)
WBC: 12.6 10*3/uL — ABNORMAL HIGH (ref 4.0–10.5)
nRBC: 0 % (ref 0.0–0.2)

## 2020-07-14 LAB — PROTIME-INR
INR: 1.1 (ref 0.8–1.2)
Prothrombin Time: 13.3 seconds (ref 11.4–15.2)

## 2020-07-14 LAB — COMPREHENSIVE METABOLIC PANEL
ALT: 322 U/L — ABNORMAL HIGH (ref 0–44)
AST: 261 U/L — ABNORMAL HIGH (ref 15–41)
Albumin: 3.7 g/dL (ref 3.5–5.0)
Alkaline Phosphatase: 137 U/L — ABNORMAL HIGH (ref 38–126)
Anion gap: 13 (ref 5–15)
BUN: 7 mg/dL (ref 6–20)
CO2: 19 mmol/L — ABNORMAL LOW (ref 22–32)
Calcium: 9.4 mg/dL (ref 8.9–10.3)
Chloride: 106 mmol/L (ref 98–111)
Creatinine, Ser: 0.72 mg/dL (ref 0.44–1.00)
GFR, Estimated: >60 mL/min (ref >60)
Glucose, Bld: 125 mg/dL — ABNORMAL HIGH (ref 70–99)
Potassium: 3.7 mmol/L (ref 3.5–5.1)
Sodium: 138 mmol/L (ref 135–145)
Total Bilirubin: 8.7 mg/dL — ABNORMAL HIGH (ref 0.3–1.2)
Total Protein: 8.3 g/dL — ABNORMAL HIGH (ref 6.5–8.1)

## 2020-07-14 LAB — URINALYSIS, ROUTINE W REFLEX MICROSCOPIC
Glucose, UA: NEGATIVE mg/dL
Hgb urine dipstick: NEGATIVE
Ketones, ur: 40 mg/dL — AB
Leukocytes,Ua: NEGATIVE
Nitrite: NEGATIVE
Protein, ur: NEGATIVE mg/dL
Specific Gravity, Urine: 1.01 (ref 1.005–1.030)
pH: 5 (ref 5.0–8.0)

## 2020-07-14 LAB — HIV ANTIBODY (ROUTINE TESTING W REFLEX): HIV Screen 4th Generation wRfx: NONREACTIVE

## 2020-07-14 LAB — LIPASE, BLOOD: Lipase: 33 U/L (ref 11–51)

## 2020-07-14 LAB — SARS CORONAVIRUS 2 BY RT PCR (HOSPITAL ORDER, PERFORMED IN ~~LOC~~ HOSPITAL LAB): SARS Coronavirus 2: POSITIVE — AB

## 2020-07-14 LAB — PREGNANCY, URINE: Preg Test, Ur: NEGATIVE

## 2020-07-14 MED ORDER — DIPHENHYDRAMINE HCL 25 MG PO CAPS: 25.0000 mg | ORAL_CAPSULE | Freq: Four times a day (QID) | ORAL | Status: DC | PRN

## 2020-07-14 MED ORDER — ONDANSETRON 4 MG PO TBDP: 4.0000 mg | ORAL_TABLET | Freq: Four times a day (QID) | ORAL | Status: DC | PRN

## 2020-07-14 MED ORDER — METOPROLOL TARTRATE 5 MG/5ML IV SOLN
5.0000 mg | Freq: Once | INTRAVENOUS | Status: AC
  Administered 2020-07-14: 5 mg via INTRAVENOUS
  Filled 2020-07-14: qty 5

## 2020-07-14 MED ORDER — PIPERACILLIN-TAZOBACTAM 3.375 G IVPB
3.3750 g | Freq: Three times a day (TID) | INTRAVENOUS | Status: DC
  Administered 2020-07-14 – 2020-07-20 (×19): 3.375 g via INTRAVENOUS
  Filled 2020-07-14 (×19): qty 50

## 2020-07-14 MED ORDER — PIPERACILLIN-TAZOBACTAM 3.375 G IVPB 30 MIN
3.3750 g | Freq: Once | INTRAVENOUS | Status: AC
  Administered 2020-07-14: 3.375 g via INTRAVENOUS
  Filled 2020-07-14: qty 50

## 2020-07-14 MED ORDER — ONDANSETRON HCL 4 MG/2ML IJ SOLN
4.0000 mg | Freq: Once | INTRAMUSCULAR | Status: AC
  Administered 2020-07-14: 4 mg via INTRAVENOUS
  Filled 2020-07-14: qty 2

## 2020-07-14 MED ORDER — PROMETHAZINE HCL 25 MG/ML IJ SOLN
6.2500 mg | Freq: Once | INTRAMUSCULAR | Status: AC
  Administered 2020-07-14: 6.25 mg via INTRAVENOUS
  Filled 2020-07-14: qty 1

## 2020-07-14 MED ORDER — IOHEXOL 350 MG/ML SOLN
100.0000 mL | Freq: Once | INTRAVENOUS | Status: AC | PRN
  Administered 2020-07-14: 100 mL via INTRAVENOUS

## 2020-07-14 MED ORDER — FENTANYL CITRATE (PF) 100 MCG/2ML IJ SOLN
100.0000 ug | Freq: Once | INTRAMUSCULAR | Status: AC
  Administered 2020-07-14: 100 ug via INTRAVENOUS
  Filled 2020-07-14: qty 2

## 2020-07-14 MED ORDER — MORPHINE SULFATE (PF) 2 MG/ML IV SOLN
2.0000 mg | INTRAVENOUS | Status: DC | PRN
  Administered 2020-07-14: 3 mg via INTRAVENOUS
  Administered 2020-07-14 – 2020-07-15 (×4): 2 mg via INTRAVENOUS
  Filled 2020-07-14: qty 1
  Filled 2020-07-14: qty 2
  Filled 2020-07-14 (×3): qty 1

## 2020-07-14 MED ORDER — POTASSIUM CHLORIDE IN NACL 20-0.9 MEQ/L-% IV SOLN
INTRAVENOUS | Status: DC
  Filled 2020-07-14 (×9): qty 1000

## 2020-07-14 MED ORDER — LACTATED RINGERS IV BOLUS
1000.0000 mL | Freq: Once | INTRAVENOUS | Status: AC
  Administered 2020-07-14: 1000 mL via INTRAVENOUS

## 2020-07-14 MED ORDER — DIPHENHYDRAMINE HCL 50 MG/ML IJ SOLN: 25.0000 mg | Freq: Four times a day (QID) | INTRAMUSCULAR | Status: DC | PRN

## 2020-07-14 MED ORDER — ONDANSETRON HCL 4 MG/2ML IJ SOLN
4.0000 mg | Freq: Four times a day (QID) | INTRAMUSCULAR | Status: DC | PRN
  Administered 2020-07-14 – 2020-07-15 (×4): 4 mg via INTRAVENOUS
  Filled 2020-07-14 (×4): qty 2

## 2020-07-14 NOTE — ED Notes (Signed)
Patient was positive for Covid on the 11th. Came in for nausea and vomiting, CT revealed gallstones. Got Fentanyl at 2:59pm. Patient complaining of pain in the middle of her shoulder blades. Patient is ambulatory.

## 2020-07-14 NOTE — ED Provider Notes (Signed)
[facility] Provider Note: Tina Dell, MD, FACEP  CSN: [id number] MRN: [id number] ARRIVAL: 07/14/20 at 0047 ROOM: [facility]   CHIEF COMPLAINT  Vomiting   HISTORY OF PRESENT ILLNESS  07/14/20 1:57 AM Tina Figueroa is a 40 y.o. female who developed Covid symptoms on 06/27/2020 and tested positive for Covid on 06/29/2020.  She tested negative on 07/06/2020 and return to work the next day.  She was doing well until 07/08/2020 when she started feeling general malaise and fatigue.  This continued until 07/11/2020 when she developed nausea and vomiting.  The nausea and vomiting has been severe and exacerbated anytime she tries to eat or drink.  She has not had associated diarrhea.  She has been having pain in her lower chest and back when she coughs or takes a deep breath.  She rates that pain is a 7 out of 10.    Past Medical History:  Diagnosis Date  . B12 deficiency 04/09/2016  . Elevated blood pressure reading without diagnosis of hypertension   . History of chicken pox   . Hypertension   . Mild hyperlipidemia 03/18/2012  . OSA (obstructive sleep apnea) 03/25/2012   Severe OSA per sleep study 10/13.   Marland Kitchen Palpitations   . PCOS (polycystic ovarian syndrome)     Past Surgical History:  Procedure Laterality Date  . ADENOIDECTOMY  1989  . DILATION AND CURETTAGE OF UTERUS    . TONSILLECTOMY    . WISDOM TOOTH EXTRACTION      Family History  Problem Relation Age of Onset  . Heart disease Mother        CHF  . Hypertension Mother   . Depression Mother   . Asthma Mother   . Polycystic ovary syndrome Mother   . Arthritis Father   . Cancer Other        aunt  . Diabetes Maternal Aunt   . Arthritis Maternal Uncle   . Diabetes Paternal Uncle   . Arthritis Maternal Grandmother   . Cancer Maternal Grandmother        lung  . Arthritis Paternal Grandmother   . Cancer Paternal Grandmother        breast  . Stroke Paternal Grandmother   . Diabetes Paternal Grandmother   .  Cancer Paternal Grandfather        colon and prostate  . Cancer Paternal Aunt 24       breast    Social History   Tobacco Use  . Smoking status: Never Smoker  . Smokeless tobacco: Never Used  Substance Use Topics  . Alcohol use: Yes    Comment: Rarely  . Drug use: No    Prior to Admission medications   Medication Sig Start Date End Date Taking? Authorizing Provider  buPROPion (WELLBUTRIN XL) 150 MG 24 hr tablet Take 1 tablet (150 mg total) by mouth daily. 06/03/20   Sandford Craze, NP  escitalopram (LEXAPRO) 20 MG tablet Take 1 tablet (20 mg total) by mouth daily. 06/03/20   Sandford Craze, NP  fluticasone (FLONASE) 50 MCG/ACT nasal spray Place 2 sprays into both nostrils daily. 11/20/19   Sandford Craze, NP  JUNEL 1.5/30 1.5-30 MG-MCG tablet Take 1.5-30 mg by mouth daily. 04/24/18   [provider]  meloxicam (MOBIC) 7.5 MG tablet TAKE 1 TABLET DAILY 10/05/19   Sandford Craze, NP  metoprolol succinate (TOPROL-XL) 50 MG 24 hr tablet Take 1 tablet (50 mg total) by mouth daily. Take with or immediately following a meal. 06/16/20  Sandford Craze, NP  montelukast (SINGULAIR) 10 MG tablet Take 1 tablet (10 mg total) by mouth at bedtime. 06/03/20   Sandford Craze, NP  Multiple Vitamin (MULTIVITAMIN) tablet Take 1 tablet by mouth daily.    [provider]  norethindrone-ethinyl estradiol (JUNEL FE 1/20) 1-20 MG-MCG tablet Take 1 tablet by mouth daily. 06/08/19   Willodean Rosenthal, MD  ondansetron (ZOFRAN) 4 MG tablet Take 1 tablet (4 mg total) by mouth every 8 (eight) hours as needed for nausea or vomiting. 07/13/20   Sandford Craze, NP    Allergies Flagyl [metronidazole hcl], Metformin and related, and Spironolactone   REVIEW OF SYSTEMS  Negative except as noted here or in the History of Present Illness.   PHYSICAL EXAMINATION  Initial Vital Signs Blood pressure (!) 139/92, pulse (!) 118, temperature 97.9 F (36.6 C),  temperature source Oral, resp. rate 20, height 5\' 4"  (1.626 m), weight (!) 138.3 kg, last menstrual period 06/13/2020, SpO2 99 %.  Examination General: Well-developed, obese female in no acute distress; appearance consistent with age of record HENT: normocephalic; atraumatic Eyes: pupils equal, round and reactive to light; extraocular muscles intact; scleral icterus Neck: supple Heart: regular rate and rhythm; tachycardia Lungs: clear to auscultation bilaterally Chest: Chest wall tenderness Abdomen: soft; nondistended; right upper quadrant tenderness; bowel sounds present Extremities: No deformity; full range of motion; pulses normal Neurologic: Awake, alert and oriented; motor function intact in all extremities and symmetric; no facial droop Skin: Warm and dry Psychiatric: Normal mood and affect   RESULTS  Summary of this visit's results, reviewed and interpreted by myself:   EKG Interpretation  Date/Time:  Thursday July 14 2020 00:57:36 EST Ventricular Rate:  114 PR Interval:    QRS Duration: 84 QT Interval:  331 QTC Calculation: 456 R Axis:   54 Text Interpretation: Sinus tachycardia No previous ECGs available Confirmed by Evette Diclemente, [birth year]2002 (Jonny Ruiz) on 07/14/2020 1:14:51 AM      Laboratory Studies: Results for orders placed or performed during the hospital encounter of 07/14/20 (from the past 24 hour(s))  CBC with Differential     Status: Abnormal   Collection Time: 07/14/20  1:01 AM  Result Value Ref Range   WBC 12.6 (H) 4.0 - 10.5 K/uL   RBC 4.76 3.87 - 5.11 MIL/uL   Hemoglobin 15.2 (H) 12.0 - 15.0 g/dL   HCT 07/16/20 09.9 - 83.3 %   MCV 93.3 80.0 - 100.0 fL   MCH 31.9 26.0 - 34.0 pg   MCHC 34.2 30.0 - 36.0 g/dL   RDW 82.5 05.3 - 97.6 %   Platelets 323 150 - 400 K/uL   nRBC 0.0 0.0 - 0.2 %   Neutrophils Relative % 83 %   Neutro Abs 10.4 (H) 1.7 - 7.7 K/uL   Lymphocytes Relative 10 %   Lymphs Abs 1.2 0.7 - 4.0 K/uL   Monocytes Relative 6 %   Monocytes Absolute 0.8 0.1  - 1.0 K/uL   Eosinophils Relative 1 %   Eosinophils Absolute 0.1 0.0 - 0.5 K/uL   Basophils Relative 0 %   Basophils Absolute 0.0 0.0 - 0.1 K/uL   Immature Granulocytes 0 %   Abs Immature Granulocytes 0.05 0.00 - 0.07 K/uL  Comprehensive metabolic panel     Status: Abnormal   Collection Time: 07/14/20  1:01 AM  Result Value Ref Range   Sodium 138 135 - 145 mmol/L   Potassium 3.7 3.5 - 5.1 mmol/L   Chloride 106 98 - 111 mmol/L   CO2  19 (L) 22 - 32 mmol/L   Glucose, Bld 125 (H) 70 - 99 mg/dL   BUN 7 6 - 20 mg/dL   Creatinine, Ser 3.64 0.44 - 1.00 mg/dL   Calcium 9.4 8.9 - 68.0 mg/dL   Total Protein 8.3 (H) 6.5 - 8.1 g/dL   Albumin 3.7 3.5 - 5.0 g/dL   AST 321 (H) 15 - 41 U/L   ALT 322 (H) 0 - 44 U/L   Alkaline Phosphatase 137 (H) 38 - 126 U/L   Total Bilirubin 8.7 (H) 0.3 - 1.2 mg/dL   GFR, Estimated >22 >48 mL/min   Anion gap 13 5 - 15  Lipase, blood     Status: None   Collection Time: 07/14/20  1:01 AM  Result Value Ref Range   Lipase 33 11 - 51 U/L  Urinalysis, Routine w reflex microscopic     Status: Abnormal   Collection Time: 07/14/20  3:50 AM  Result Value Ref Range   Color, Urine AMBER (A) YELLOW   APPearance CLEAR CLEAR   Specific Gravity, Urine 1.010 1.005 - 1.030   pH 5.0 5.0 - 8.0   Glucose, UA NEGATIVE NEGATIVE mg/dL   Hgb urine dipstick NEGATIVE NEGATIVE   Bilirubin Urine LARGE (A) NEGATIVE   Ketones, ur 40 (A) NEGATIVE mg/dL   Protein, ur NEGATIVE NEGATIVE mg/dL   Nitrite NEGATIVE NEGATIVE   Leukocytes,Ua NEGATIVE NEGATIVE  Pregnancy, urine     Status: None   Collection Time: 07/14/20  3:50 AM  Result Value Ref Range   Preg Test, Ur NEGATIVE NEGATIVE   Imaging Studies: CT ABDOMEN PELVIS W CONTRAST  Result Date: 07/14/2020 CLINICAL DATA:  Right upper quadrant abdominal pain. Nausea and vomiting. Jaundice. EXAM: CT ABDOMEN AND PELVIS WITH CONTRAST TECHNIQUE: Multidetector CT imaging of the abdomen and pelvis was performed using the standard protocol  following bolus administration of intravenous contrast. CONTRAST:  OMNIPAQUE IOHEXOL 350 MG/ML SOLN COMPARISON:  Ultrasound abdomen 09/04/2014 FINDINGS: Lower chest: Lung bases are clear. Hepatobiliary: Simple appearing cyst in the lateral segment left lobe of liver. This was present on the previous ultrasound and likely benign. No other focal lesions. Multiple stones are demonstrated in the gallbladder with gallbladder wall thickening and edema. Intra and extrahepatic bile duct dilatation. Stone demonstrated in the distal common bile duct. Pancreas: Unremarkable. No pancreatic ductal dilatation or surrounding inflammatory changes. Spleen: Normal in size without focal abnormality. Adrenals/Urinary Tract: Adrenal glands are unremarkable. Kidneys are normal, without renal calculi, focal lesion, or hydronephrosis. Bladder is unremarkable. Stomach/Bowel: Stomach is within normal limits. Appendix appears normal. No evidence of bowel wall thickening, distention, or inflammatory changes. Vascular/Lymphatic: No significant vascular findings are present. No enlarged abdominal or pelvic lymph nodes. Reproductive: Uterus and bilateral adnexa are unremarkable. Other: No abdominal wall hernia or abnormality. No abdominopelvic ascites. Musculoskeletal: No acute or significant osseous findings. IMPRESSION: 1. Cholelithiasis with gallbladder wall thickening and edema consistent with acute cholecystitis. Stone demonstrated in the distal common bile duct with intra and extrahepatic bile duct dilatation. 2. Simple appearing cyst in the left lobe of liver. This was present on the previous ultrasound and likely benign. Electronically Signed   By: Burman Nieves M.D.   On: 07/14/2020 03:20   DG Chest Portable 1 View  Result Date: 07/14/2020 CLINICAL DATA:  Dyspnea, vomiting EXAM: PORTABLE CHEST 1 VIEW COMPARISON:  05/15/2011 FINDINGS: The heart size and mediastinal contours are within normal limits. Both lungs are clear. The  visualized skeletal structures are unremarkable. IMPRESSION: No  active disease. Electronically Signed   By: Helyn Numbers MD   On: 07/14/2020 01:36    ED COURSE and MDM  Nursing notes, initial and subsequent vitals signs, including pulse oximetry, reviewed and interpreted by myself.  Vitals:   07/14/20 0348 07/14/20 0349 07/14/20 0350 07/14/20 0351  BP:      Pulse: (!) 136 (!) 135 (!) 138 (!) 127  Resp: (!) 21 19 19 16   Temp:      TempSrc:      SpO2: 98% 99% 98% 99%  Weight:      Height:       Medications  piperacillin-tazobactam (ZOSYN) IVPB 3.375 g (3.375 g Intravenous New Bag/Given 07/14/20 0400)  lactated ringers bolus 1,000 mL (0 mLs Intravenous Stopped 07/14/20 0243)  ondansetron (ZOFRAN) injection 4 mg (4 mg Intravenous Given 07/14/20 0129)  metoprolol tartrate (LOPRESSOR) injection 5 mg (5 mg Intravenous Given 07/14/20 0209)  fentaNYL (SUBLIMAZE) injection 100 mcg (100 mcg Intravenous Given 07/14/20 0241)  iohexol (OMNIPAQUE) 350 MG/ML injection 100 mL (100 mLs Intravenous Contrast Given 07/14/20 0305)  metoprolol tartrate (LOPRESSOR) injection 5 mg (5 mg Intravenous Given 07/14/20 0358)   3:51 AM Zosyn ordered for acute cholecystitis.  4:01 AM Discussed with Dr. 07/16/20 who will see the patient in the [facility] long ED.  Discussed with Dr. [facility] who is the accepting EDP.  Patient continues to be tachycardic despite hydration.  She has not been able to take her usual medications including her metoprolol.  I suspect part of her tachycardia is due to beta-blocker withdrawal.  We will give her IV metoprolol.   PROCEDURES  Procedures CRITICAL CARE Performed by: Nicanor Alcon Noa Galvao Total critical care time: 30 minutes Critical care time was exclusive of separately billable procedures and treating other patients. Critical care was necessary to treat or prevent imminent or life-threatening deterioration. Critical care was time spent personally by me on the following activities: development  of treatment plan with patient and/or surrogate as well as nursing, discussions with consultants, evaluation of patient's response to treatment, examination of patient, obtaining history from patient or surrogate, ordering and performing treatments and interventions, ordering and review of laboratory studies, ordering and review of radiographic studies, pulse oximetry and re-evaluation of patient's condition.   ED DIAGNOSES     ICD-10-CM   1. Choledocholithiasis with acute cholecystitis with obstruction  K80.43        Armilda Vanderlinden, Carlisle Beers, MD 07/14/20 [phone]

## 2020-07-14 NOTE — ED Notes (Signed)
Pt transported to CT at this time.

## 2020-07-14 NOTE — ED Notes (Signed)
Care Handoff/Report given to Rosezena Sensor RN. All questions answered. Pt also signed Transfer Consent Form voluntarily.

## 2020-07-14 NOTE — H&P (Signed)
[facility] Admission Note  Tina Figueroa 1981/05/25  [id number].    Requesting MD: Shanon Rosser Chief Complaint: Nausea and vomiting Reason for Consult: Choledocholithiasis/Covid positive  HPI:  Patient is a 40 year old female who developed Covid symptoms 06/27/2020 patient tested positive at an urgent care.  On 06/29/2020 was subsequently negative on 07/06/20 at a testing site in [place].  She returned to work on 07/08/2020, and started feeling general malaise/fatigue.  On 124/22 she developed nausea and vomiting worse with any p.o. intake.  She also complained of pain in her lower chest and back.  There is a video visit with her PCP yesterday, but she ended up in the ED at [facility].   Work-up in the ED shows she is afebrile blood pressure mildly elevated.  She was intermittently tachycardic.  CMP shows glucose of 125, CO2 of 19, alk phos 137, AST 261, ALT 322, total bilirubin 8.7.  WBC 12.6, hemoglobin 15.2, hematocrit 44.4, platelets 323,000.  Urine pregnancy test is negative.  Covid PCR is positive.  Chest x-ray was unremarkable, no active disease.  CT of the abdomen pelvis with contrast showed cholelithiasis with gallbladder wall thickening and edema consistent with acute cholecystitis, stones demonstrated in the common bile duct with intrahepatic and extrahepatic bile duct dilatation, also a simple cyst in the left lobe of liver was present on ultrasound.  She was transferred to [facility] for further evaluation.  Additional medical problems include, polycystic ovarian syndrome, severe obstructive sleep apnea, hypertension  ROS: Review of Systems  Constitutional: Positive for fever (99.8 range) and weight loss (not sure has not weighed).  HENT: Negative.   Eyes: Negative.   Respiratory:       She reports apneic episodes.  She says she skips of breath.  She had CPAP but does not use it because it kept her awake.  Now insurance would not cover it.  Cardiovascular:  Positive for palpitations. Negative for orthopnea, claudication, leg swelling and PND.       Hx of tachycardia, on metoprolol.  Gastrointestinal: Positive for abdominal pain (Right side especially with palpation or movement of the right leg.), heartburn, nausea and vomiting. Negative for blood in stool, constipation, diarrhea and melena.  Genitourinary:       She reports dark-colored urine.  Musculoskeletal: Negative.   Skin: Negative.   Neurological: Negative.   Endo/Heme/Allergies: Negative.   Psychiatric/Behavioral: Positive for depression. The patient is nervous/anxious.     Family History  Problem Relation Age of Onset  . Heart disease Mother        CHF  . Hypertension Mother   . Depression Mother   . Asthma Mother   . Polycystic ovary syndrome Mother   . Arthritis Father   . Cancer Other        aunt  . Diabetes Maternal Aunt   . Arthritis Maternal Uncle   . Diabetes Paternal Uncle   . Arthritis Maternal Grandmother   . Cancer Maternal Grandmother        lung  . Arthritis Paternal Grandmother   . Cancer Paternal Grandmother        breast  . Stroke Paternal Grandmother   . Diabetes Paternal Grandmother   . Cancer Paternal Grandfather        colon and prostate  . Cancer Paternal Aunt 57       breast    Past Medical History:  Diagnosis Date  . B12 deficiency 04/09/2016  . Elevated blood pressure reading without diagnosis of  hypertension   . History of chicken pox   . Hypertension   . Mild hyperlipidemia 03/18/2012  . OSA (obstructive sleep apnea) 03/25/2012   Severe OSA per sleep study 10/13.   Marland Kitchen Palpitations   . PCOS (polycystic ovarian syndrome)     Past Surgical History:  Procedure Laterality Date  . ADENOIDECTOMY  1989  . DILATION AND CURETTAGE OF UTERUS    . TONSILLECTOMY    . WISDOM TOOTH EXTRACTION      Social History:  reports that she has never smoked. She has never used smokeless tobacco. She reports current alcohol use. She reports that she does  not use drugs. EtOH: Social Drugs: None Tobacco: None She lives with her husband and father and cat. She works at a customer's relations job  Allergies:  Allergies  Allergen Reactions  . Flagyl [Metronidazole Hcl]     Chalky and coated tongue  . Metformin And Related Other (See Comments)    Nausea, diarrhea  . Spironolactone     Increased menstrual bleeding.    Prior to Admission medications   Medication Sig Start Date End Date Taking? Authorizing Provider  buPROPion (WELLBUTRIN XL) 150 MG 24 hr tablet Take 1 tablet (150 mg total) by mouth daily. 06/03/20   [name], NP  escitalopram (LEXAPRO) 20 MG tablet Take 1 tablet (20 mg total) by mouth daily. 06/03/20   [name], NP  fluticasone (FLONASE) 50 MCG/ACT nasal spray Place 2 sprays into both nostrils daily. 11/20/19   [name], NP  JUNEL 1.5/30 1.5-30 MG-MCG tablet Take 1.5-30 mg by mouth daily. 04/24/18   [provider]  meloxicam (MOBIC) 7.5 MG tablet TAKE 1 TABLET DAILY 10/05/19   [name], NP  metoprolol succinate (TOPROL-XL) 50 MG 24 hr tablet Take 1 tablet (50 mg total) by mouth daily. Take with or immediately following a meal. 06/16/20   [name], NP  montelukast (SINGULAIR) 10 MG tablet Take 1 tablet (10 mg total) by mouth at bedtime. 06/03/20   [name], NP  Multiple Vitamin (MULTIVITAMIN) tablet Take 1 tablet by mouth daily.    [provider]  norethindrone-ethinyl estradiol (JUNEL FE 1/20) 1-20 MG-MCG tablet Take 1 tablet by mouth daily. 06/08/19   Lavonia Drafts, MD  ondansetron (ZOFRAN) 4 MG tablet Take 1 tablet (4 mg total) by mouth every 8 (eight) hours as needed for nausea or vomiting. 07/13/20   [name], NP     Blood pressure (!) 146/89, pulse 83, temperature 98.1 F (36.7 C), temperature source Oral, resp. rate 19, height 5' 4"  (1.626 m), weight (!) 138.3 kg, last menstrual period 06/13/2020, SpO2 98 %. Physical  Exam:  General: pleasant, African-American female, on a gurney in the ED having some ongoing discomfort but in no acute distress. HEENT: head is normocephalic, atraumatic.  Sclera are noninjected.  Pupils are equal.  Ears and nose without any masses or lesions.  Mouth is pink and moist Heart: regular, slightly tachycardic.  Normal s1,s2. No obvious murmurs, gallops, or rubs noted.  Palpable radial and pedal pulses bilaterally Lungs: CTAB, no wheezes, rhonchi, or rales noted.  Respiratory effort nonlabored Abd: soft, tender in the right upper quadrant also has pain with palpation or bending of her right leg. MS: all 4 extremities are symmetrical with no cyanosis, clubbing, or edema. Skin: warm and dry with no masses, lesions, or rashes Neuro: Cranial nerves 2-12 grossly intact, sensation is normal throughout Psych: A&Ox3 with an appropriate affect.   Results for orders placed  or performed during the hospital encounter of 07/14/20 (from the past 48 hour(s))  CBC with Differential     Status: Abnormal   Collection Time: 07/14/20  1:01 AM  Result Value Ref Range   WBC 12.6 (H) 4.0 - 10.5 K/uL   RBC 4.76 3.87 - 5.11 MIL/uL   Hemoglobin 15.2 (H) 12.0 - 15.0 g/dL   HCT 44.4 36.0 - 46.0 %   MCV 93.3 80.0 - 100.0 fL   MCH 31.9 26.0 - 34.0 pg   MCHC 34.2 30.0 - 36.0 g/dL   RDW 11.9 11.5 - 15.5 %   Platelets 323 150 - 400 K/uL   nRBC 0.0 0.0 - 0.2 %   Neutrophils Relative % 83 %   Neutro Abs 10.4 (H) 1.7 - 7.7 K/uL   Lymphocytes Relative 10 %   Lymphs Abs 1.2 0.7 - 4.0 K/uL   Monocytes Relative 6 %   Monocytes Absolute 0.8 0.1 - 1.0 K/uL   Eosinophils Relative 1 %   Eosinophils Absolute 0.1 0.0 - 0.5 K/uL   Basophils Relative 0 %   Basophils Absolute 0.0 0.0 - 0.1 K/uL   Immature Granulocytes 0 %   Abs Immature Granulocytes 0.05 0.00 - 0.07 K/uL    Comment: Performed at [facility], [address] [place], [place] [address]  Comprehensive metabolic panel     Status: Abnormal    Collection Time: 07/14/20  1:01 AM  Result Value Ref Range   Sodium 138 135 - 145 mmol/L   Potassium 3.7 3.5 - 5.1 mmol/L   Chloride 106 98 - 111 mmol/L   CO2 19 (L) 22 - 32 mmol/L   Glucose, Bld 125 (H) 70 - 99 mg/dL    Comment: Glucose reference range applies only to samples taken after fasting for at least 8 hours.   BUN 7 6 - 20 mg/dL   Creatinine, Ser 0.72 0.44 - 1.00 mg/dL   Calcium 9.4 8.9 - 10.3 mg/dL   Total Protein 8.3 (H) 6.5 - 8.1 g/dL   Albumin 3.7 3.5 - 5.0 g/dL   AST 261 (H) 15 - 41 U/L   ALT 322 (H) 0 - 44 U/L   Alkaline Phosphatase 137 (H) 38 - 126 U/L   Total Bilirubin 8.7 (H) 0.3 - 1.2 mg/dL   GFR, Estimated >60 >60 mL/min    Comment: (NOTE) Calculated using the CKD-EPI Creatinine Equation (2021)    Anion gap 13 5 - 15    Comment: Performed at [facility], [address] [place], [place] [address]  Lipase, blood     Status: None   Collection Time: 07/14/20  1:01 AM  Result Value Ref Range   Lipase 33 11 - 51 U/L    Comment: Performed at [facility], [address] [place], [place] [address]  Urinalysis, Routine w reflex microscopic     Status: Abnormal   Collection Time: 07/14/20  3:50 AM  Result Value Ref Range   Color, Urine AMBER (A) YELLOW    Comment: BIOCHEMICALS MAY BE AFFECTED BY COLOR   APPearance CLEAR CLEAR   Specific Gravity, Urine 1.010 1.005 - 1.030   pH 5.0 5.0 - 8.0   Glucose, UA NEGATIVE NEGATIVE mg/dL   Hgb urine dipstick NEGATIVE NEGATIVE   Bilirubin Urine LARGE (A) NEGATIVE   Ketones, ur 40 (A) NEGATIVE mg/dL   Protein, ur NEGATIVE NEGATIVE mg/dL   Nitrite NEGATIVE NEGATIVE   Leukocytes,Ua NEGATIVE NEGATIVE  Comment: Microscopic not done on urines with negative protein, blood, leukocytes, nitrite, or glucose < 500 mg/dL. Performed at [facility], [address] [place], [place] [address]   SARS Coronavirus 2 by RT PCR (hospital order, performed in [facility] hospital lab) Nasopharyngeal  Nasopharyngeal Swab     Status: Abnormal   Collection Time: 07/14/20  3:50 AM   Specimen: Nasopharyngeal Swab  Result Value Ref Range   SARS Coronavirus 2 POSITIVE (A) NEGATIVE    Comment: RESULT CALLED TO, READ BACK BY AND VERIFIED WITH: ADKINS,L AT [id number] ON [id number] BY CHERESNOWSKY,T (NOTE) SARS-CoV-2 target nucleic acids are DETECTED  SARS-CoV-2 RNA is generally detectable in upper respiratory specimens  during the acute phase of infection.  Positive results are indicative  of the presence of the identified virus, but do not rule out bacterial infection or co-infection with other pathogens not detected by the test.  Clinical correlation with patient history and  other diagnostic information is necessary to determine patient infection status.  The expected result is negative.  Fact Sheet for Patients:   StrictlyIdeas.no   Fact Sheet for Healthcare Providers:   BankingDealers.co.za    This test is not yet approved or cleared by the [place] FDA and  has been authorized for detection and/or diagnosis of SARS-CoV-2 by FDA under an Emergency Use Authorization (EUA).  This EUA will remain in effect (meani ng this test can be used) for the duration of  the COVID-19 declaration under Section 564(b)(1) of the Act, 21 U.S.C. section 360-bbb-3(b)(1), unless the authorization is terminated or revoked sooner.  Performed at [facility], [address] [place], [place] [address]   Pregnancy, urine     Status: None   Collection Time: 07/14/20  3:50 AM  Result Value Ref Range   Preg Test, Ur NEGATIVE NEGATIVE    Comment:        THE SENSITIVITY OF THIS METHODOLOGY IS >20 mIU/mL. Performed at [facility], [address] [place], [place] [address]    CT ABDOMEN PELVIS W CONTRAST  Result Date: 07/14/2020 CLINICAL DATA:  Right upper quadrant abdominal pain. Nausea and vomiting. Jaundice. EXAM: CT ABDOMEN AND PELVIS  WITH CONTRAST TECHNIQUE: Multidetector CT imaging of the abdomen and pelvis was performed using the standard protocol following bolus administration of intravenous contrast. CONTRAST:  165m OMNIPAQUE IOHEXOL 350 MG/ML SOLN COMPARISON:  Ultrasound abdomen 09/04/2014 FINDINGS: Lower chest: Lung bases are clear. Hepatobiliary: Simple appearing cyst in the lateral segment left lobe of liver. This was present on the previous ultrasound and likely benign. No other focal lesions. Multiple stones are demonstrated in the gallbladder with gallbladder wall thickening and edema. Intra and extrahepatic bile duct dilatation. Stone demonstrated in the distal common bile duct. Pancreas: Unremarkable. No pancreatic ductal dilatation or surrounding inflammatory changes. Spleen: Normal in size without focal abnormality. Adrenals/Urinary Tract: Adrenal glands are unremarkable. Kidneys are normal, without renal calculi, focal lesion, or hydronephrosis. Bladder is unremarkable. Stomach/Bowel: Stomach is within normal limits. Appendix appears normal. No evidence of bowel wall thickening, distention, or inflammatory changes. Vascular/Lymphatic: No significant vascular findings are present. No enlarged abdominal or pelvic lymph nodes. Reproductive: Uterus and bilateral adnexa are unremarkable. Other: No abdominal wall hernia or abnormality. No abdominopelvic ascites. Musculoskeletal: No acute or significant osseous findings. IMPRESSION: 1. Cholelithiasis with gallbladder wall thickening and edema consistent with acute cholecystitis. Stone demonstrated in the distal common bile duct with intra and extrahepatic bile duct dilatation. 2.  Simple appearing cyst in the left lobe of liver. This was present on the previous ultrasound and likely benign. Electronically Signed   By: Lucienne Capers M.D.   On: 07/14/2020 03:20   DG Chest Portable 1 View  Result Date: 07/14/2020 CLINICAL DATA:  Dyspnea, vomiting EXAM: PORTABLE CHEST 1 VIEW  COMPARISON:  05/15/2011 FINDINGS: The heart size and mediastinal contours are within normal limits. Both lungs are clear. The visualized skeletal structures are unremarkable. IMPRESSION: No active disease. Electronically Signed   By: Fidela Salisbury MD   On: 07/14/2020 01:36      Assessment/Plan Hx tachycardia -type unknown Hypertension OSA Polycystic ovarian syndrome Covid positive 06/27/20 COVID NEGATIVE 07/06/20; PCR COVID + 07/14/20 - 40ND run BMI 52.3  Acute cholecystitis/choledocholithiasis  FEN:NPO/IV fluids ID:  Zosyn DVT:  Will add SCD   Plan: We will admit, Medicine is going to see in consultation.  At this point they do not think we need to treat her as Covid positive.  We will place her on telemetry because of her history of tachycardia.  I will add an abdominal ultrasound and ask GI to see.  Evaluate for ERCP.  We will keep her n.p.o. and start her on IV fluids.  She was started on Zosyn at [facility] will continue that for now.  After GI evaluation/ERCP we will schedule for laparoscopic cholecystectomy.     Earnstine Regal [facility] Surgery 07/14/2020, 7:06 AM Please see Amion for pager number during day hours 7:00am-4:30pm

## 2020-07-14 NOTE — ED Notes (Signed)
Pt has been provided with items to complete ADLs. She has removed her jewelry, placed in her purse/tote.

## 2020-07-14 NOTE — ED Notes (Signed)
ED TO INPATIENT HANDOFF REPORT  Name/Age/Gender Tina Figueroa 40 y.o. female  Code Status    Code Status Orders  (From admission, onward)         Start     Ordered   07/14/20 0859  Full code  Continuous        07/14/20 0907        Code Status History    This patient has a current code status but no historical code status.   Advance Care Planning Activity      Home/SNF/Other Home  Chief Complaint Choledocholithiasis with acute cholecystitis [K80.42]  Level of Care/Admitting Diagnosis ED Disposition    ED Disposition Condition Comment   Admit  Hospital Area: [facility] [[id number]]  Level of Care: Telemetry [5]  Admit to tele based on following criteria: Other see comments  Comments: hx of tachycardia -on metoprolol  Covid Evaluation: Confirmed COVID Positive  Diagnosis: Choledocholithiasis with acute cholecystitis [373428]  Admitting Physician: CCS, MD [[id number]]  Attending Physician: CCS, MD [[id number]]       Medical History Past Medical History:  Diagnosis Date  . B12 deficiency 04/09/2016  . Elevated blood pressure reading without diagnosis of hypertension   . History of chicken pox   . Hypertension   . Mild hyperlipidemia 03/18/2012  . OSA (obstructive sleep apnea) 03/25/2012   Severe OSA per sleep study 10/13.   Marland Kitchen Palpitations   . PCOS (polycystic ovarian syndrome)     Allergies Allergies  Allergen Reactions  . Flagyl [Metronidazole Hcl]     Chalky and coated tongue  . Metformin And Related Other (See Comments)    Nausea, diarrhea  . Spironolactone     Increased menstrual bleeding.    IV Location/Drains/Wounds Patient Lines/Drains/Airways Status    Active Line/Drains/Airways    Name Placement date Placement time Site Days   Peripheral IV 07/14/20 Left;Posterior Hand 07/14/20  0120  Hand  less than 1          Labs/Imaging Results for orders placed or performed during the hospital encounter of 07/14/20 (from the past 48  hour(s))  CBC with Differential     Status: Abnormal   Collection Time: 07/14/20  1:01 AM  Result Value Ref Range   WBC 12.6 (H) 4.0 - 10.5 K/uL   RBC 4.76 3.87 - 5.11 MIL/uL   Hemoglobin 15.2 (H) 12.0 - 15.0 g/dL   HCT 76.8 11.5 - 72.6 %   MCV 93.3 80.0 - 100.0 fL   MCH 31.9 26.0 - 34.0 pg   MCHC 34.2 30.0 - 36.0 g/dL   RDW 20.3 55.9 - 74.1 %   Platelets 323 150 - 400 K/uL   nRBC 0.0 0.0 - 0.2 %   Neutrophils Relative % 83 %   Neutro Abs 10.4 (H) 1.7 - 7.7 K/uL   Lymphocytes Relative 10 %   Lymphs Abs 1.2 0.7 - 4.0 K/uL   Monocytes Relative 6 %   Monocytes Absolute 0.8 0.1 - 1.0 K/uL   Eosinophils Relative 1 %   Eosinophils Absolute 0.1 0.0 - 0.5 K/uL   Basophils Relative 0 %   Basophils Absolute 0.0 0.0 - 0.1 K/uL   Immature Granulocytes 0 %   Abs Immature Granulocytes 0.05 0.00 - 0.07 K/uL    Comment: Performed at [facility], [address] [place], [place] [address]  Comprehensive metabolic panel     Status: Abnormal   Collection Time: 07/14/20  1:01 AM  Result Value Ref  Range   Sodium 138 135 - 145 mmol/L   Potassium 3.7 3.5 - 5.1 mmol/L   Chloride 106 98 - 111 mmol/L   CO2 19 (L) 22 - 32 mmol/L   Glucose, Bld 125 (H) 70 - 99 mg/dL    Comment: Glucose reference range applies only to samples taken after fasting for at least 8 hours.   BUN 7 6 - 20 mg/dL   Creatinine, Ser 7.89 0.44 - 1.00 mg/dL   Calcium 9.4 8.9 - 38.1 mg/dL   Total Protein 8.3 (H) 6.5 - 8.1 g/dL   Albumin 3.7 3.5 - 5.0 g/dL   AST 017 (H) 15 - 41 U/L   ALT 322 (H) 0 - 44 U/L   Alkaline Phosphatase 137 (H) 38 - 126 U/L   Total Bilirubin 8.7 (H) 0.3 - 1.2 mg/dL   GFR, Estimated >51 >02 mL/min    Comment: (NOTE) Calculated using the CKD-EPI Creatinine Equation (2021)    Anion gap 13 5 - 15    Comment: Performed at [facility], [address] [place], [place] [address]  Lipase, blood     Status: None   Collection Time: 07/14/20  1:01 AM  Result Value Ref Range   Lipase  33 11 - 51 U/L    Comment: Performed at [facility], [address] [place], [place] [address]  Urinalysis, Routine w reflex microscopic     Status: Abnormal   Collection Time: 07/14/20  3:50 AM  Result Value Ref Range   Color, Urine AMBER (A) YELLOW    Comment: BIOCHEMICALS MAY BE AFFECTED BY COLOR   APPearance CLEAR CLEAR   Specific Gravity, Urine 1.010 1.005 - 1.030   pH 5.0 5.0 - 8.0   Glucose, UA NEGATIVE NEGATIVE mg/dL   Hgb urine dipstick NEGATIVE NEGATIVE   Bilirubin Urine LARGE (A) NEGATIVE   Ketones, ur 40 (A) NEGATIVE mg/dL   Protein, ur NEGATIVE NEGATIVE mg/dL   Nitrite NEGATIVE NEGATIVE   Leukocytes,Ua NEGATIVE NEGATIVE    Comment: Microscopic not done on urines with negative protein, blood, leukocytes, nitrite, or glucose < 500 mg/dL. Performed at [facility], [address] [place], [place] [address]   SARS Coronavirus 2 by RT PCR (hospital order, performed in [facility] hospital lab) Nasopharyngeal Nasopharyngeal Swab     Status: Abnormal   Collection Time: 07/14/20  3:50 AM   Specimen: Nasopharyngeal Swab  Result Value Ref Range   SARS Coronavirus 2 POSITIVE (A) NEGATIVE    Comment: RESULT CALLED TO, READ BACK BY AND VERIFIED WITH: ADKINS,L AT 4431 ON [id number] BY CHERESNOWSKY,T (NOTE) SARS-CoV-2 target nucleic acids are DETECTED  SARS-CoV-2 RNA is generally detectable in upper respiratory specimens  during the acute phase of infection.  Positive results are indicative  of the presence of the identified virus, but do not rule out bacterial infection or co-infection with other pathogens not detected by the test.  Clinical correlation with patient history and  other diagnostic information is necessary to determine patient infection status.  The expected result is negative.  Fact Sheet for Patients:   BoilerBrush.com.cy   Fact Sheet for Healthcare Providers:   https://pope.com/    This  test is not yet approved or cleared by the [place] FDA and  has been authorized for detection and/or diagnosis of SARS-CoV-2 by FDA under an Emergency Use Authorization (EUA).  This EUA will remain in effect (meani ng this test can be used) for  the duration of  the COVID-19 declaration under Section 564(b)(1) of the Act, 21 U.S.C. section 360-bbb-3(b)(1), unless the authorization is terminated or revoked sooner.  Performed at [facility], [address] [place], [place] [address]   Pregnancy, urine     Status: None   Collection Time: 07/14/20  3:50 AM  Result Value Ref Range   Preg Test, Ur NEGATIVE NEGATIVE    Comment:        THE SENSITIVITY OF THIS METHODOLOGY IS >20 mIU/mL. Performed at [facility], [address] [place], [place] [address]   HIV Antibody (routine testing w rflx)     Status: None   Collection Time: 07/14/20 10:15 AM  Result Value Ref Range   HIV Screen 4th Generation wRfx Non Reactive Non Reactive    Comment: Performed at [facility], [address] [place], [place] [address]  Protime-INR     Status: None   Collection Time: 07/14/20 10:15 AM  Result Value Ref Range   Prothrombin Time 13.3 11.4 - 15.2 seconds   INR 1.1 0.8 - 1.2    Comment: (NOTE) INR goal varies based on device and disease states. Performed at [facility], [address] [place], [place] [address]    CT ABDOMEN PELVIS W CONTRAST  Result Date: 07/14/2020 CLINICAL DATA:  Right upper quadrant abdominal pain. Nausea and vomiting. Jaundice. EXAM: CT ABDOMEN AND PELVIS WITH CONTRAST TECHNIQUE: Multidetector CT imaging of the abdomen and pelvis was performed using the standard protocol following bolus administration of intravenous contrast. CONTRAST:  OMNIPAQUE IOHEXOL 350 MG/ML SOLN COMPARISON:  Ultrasound abdomen 09/04/2014 FINDINGS: Lower chest: Lung bases are clear. Hepatobiliary: Simple appearing cyst in the lateral segment left  lobe of liver. This was present on the previous ultrasound and likely benign. No other focal lesions. Multiple stones are demonstrated in the gallbladder with gallbladder wall thickening and edema. Intra and extrahepatic bile duct dilatation. Stone demonstrated in the distal common bile duct. Pancreas: Unremarkable. No pancreatic ductal dilatation or surrounding inflammatory changes. Spleen: Normal in size without focal abnormality. Adrenals/Urinary Tract: Adrenal glands are unremarkable. Kidneys are normal, without renal calculi, focal lesion, or hydronephrosis. Bladder is unremarkable. Stomach/Bowel: Stomach is within normal limits. Appendix appears normal. No evidence of bowel wall thickening, distention, or inflammatory changes. Vascular/Lymphatic: No significant vascular findings are present. No enlarged abdominal or pelvic lymph nodes. Reproductive: Uterus and bilateral adnexa are unremarkable. Other: No abdominal wall hernia or abnormality. No abdominopelvic ascites. Musculoskeletal: No acute or significant osseous findings. IMPRESSION: 1. Cholelithiasis with gallbladder wall thickening and edema consistent with acute cholecystitis. Stone demonstrated in the distal common bile duct with intra and extrahepatic bile duct dilatation. 2. Simple appearing cyst in the left lobe of liver. This was present on the previous ultrasound and likely benign. Electronically Signed   By: Burman Nieves M.D.   On: 07/14/2020 03:20   DG Chest Portable 1 View  Result Date: 07/14/2020 CLINICAL DATA:  Dyspnea, vomiting EXAM: PORTABLE CHEST 1 VIEW COMPARISON:  05/15/2011 FINDINGS: The heart size and mediastinal contours are within normal limits. Both lungs are clear. The visualized skeletal structures are unremarkable. IMPRESSION: No active disease. Electronically Signed   By: Helyn Numbers MD   On: 07/14/2020 01:36    Pending Labs Unresulted Labs (From admission, onward)          Start     Ordered   07/15/20 0500   Comprehensive metabolic panel  Tomorrow  morning,   R        07/14/20 0907   07/15/20 0500  CBC  Tomorrow morning,   R        07/14/20 0907   07/15/20 0500  Lipase, blood  Tomorrow morning,   R        07/14/20 1331          Vitals/Pain Today's Vitals   07/14/20 1800 07/14/20 1815 07/14/20 1830 07/14/20 1845  BP:    (!) 162/90  Pulse: (!) 137 81 85 91  Resp: 20 14 (!) 23 20  Temp:      TempSrc:      SpO2: 95% 96% 93% 97%  Weight:      Height:      PainSc:        Isolation Precautions No active isolations  Medications Medications  0.9 % NaCl with KCl 20 mEq/ L  infusion ( Intravenous New Bag/Given (Non-Interop) 07/14/20 1125)  piperacillin-tazobactam (ZOSYN) IVPB 3.375 g (0 g Intravenous Stopped 07/14/20 1931)  ondansetron (ZOFRAN-ODT) disintegrating tablet 4 mg ( Oral See Alternative 07/14/20 1024)    Or  ondansetron (ZOFRAN) injection 4 mg (4 mg Intravenous Given 07/14/20 1024)  diphenhydrAMINE (BENADRYL) capsule 25 mg (has no administration in time range)    Or  diphenhydrAMINE (BENADRYL) injection 25 mg (has no administration in time range)  morphine 2 MG/ML injection 2-3 mg (3 mg Intravenous Given 07/14/20 1352)  lactated ringers bolus 1,000 mL (0 mLs Intravenous Stopped 07/14/20 0243)  ondansetron (ZOFRAN) injection 4 mg (4 mg Intravenous Given 07/14/20 0129)  metoprolol tartrate (LOPRESSOR) injection 5 mg (5 mg Intravenous Given 07/14/20 0209)  fentaNYL (SUBLIMAZE) injection 100 mcg (100 mcg Intravenous Given 07/14/20 0241)  iohexol (OMNIPAQUE) 350 MG/ML injection 100 mL (100 mLs Intravenous Contrast Given 07/14/20 0305)  piperacillin-tazobactam (ZOSYN) IVPB 3.375 g (0 g Intravenous Stopped 07/14/20 0451)  metoprolol tartrate (LOPRESSOR) injection 5 mg (5 mg Intravenous Given 07/14/20 0358)  promethazine (PHENERGAN) injection 6.25 mg (6.25 mg Intravenous Given 07/14/20 1400)    Mobility walks

## 2020-07-14 NOTE — Consult Note (Addendum)
Medical Consultation   Tina Figueroa  PFX:[id number]  DOB: February 01, 1981  DOA: 07/14/2020  PCP: Sandford Craze, NP    Requesting physician: Dr. Gerrit Friends  Reason for consultation: COVID Positive PCR   History of Present Illness: This is a 40 y.o. female who has been vaccinated against COVID 19 with a past medical history of hypertension, sinus tachycardia, vitamin B12 deficiency, OSA, Morbid obesity who presented to [facility] ED on 1/27 for nausea vomiting x3 days with poor p.o. intake.  Of note, patient developed abdominal pain, myalgias, N/V/D and nasal congestion on 1/10 and tested positive for COVID 19 on 1/10 (see test result below).  She had a negative test on 1/19, returned to work on 1/20 but stayed home on 1/21 due to fatigue. She then returned to work on Monday, 1/24, but had nausea, vomiting and poor p.o. intake starting on 1/25. She had a video visit with her PCP on 1/26 but had persistent symptoms so presented to the ED. She states that her initial COVID symptoms have resolved and her presenting symptoms are different than her symptoms on the 10th.   In the ED she is afebrile, tachycardic and tolerating room air.  Notable labs: Sodium 138, K3.7, CO2 19, glucose 125, BUN 7, creatinine 0.72, alkaline phosphatase 137, lipase 33, AST 261, ALT 322, T bili 8.7, WBC 12.6, Hb 15.2 and COVID-19 PCR positive.  CXR unremarkable.  CT abdomen pelvis with contrast: Acute cholecystitis with cholelithiasis and choledocholithiasis.  She was transferred to [facility] for further evaluation and admitted by general surgery. TRH was consulted by general surgery given her COVID 19 positive PCR.      Review of Systems  Constitutional: Negative for fever.  Respiratory: Negative for cough, shortness of breath and wheezing.   Cardiovascular: Negative for chest pain and palpitations.  Gastrointestinal: Positive for abdominal pain and nausea.       +epigastric pain  Musculoskeletal: Negative for  myalgias.  All other systems reviewed and are negative.  As per HPI otherwise 10 point review of systems negative.     Past Medical History: Past Medical History:  Diagnosis Date  . B12 deficiency 04/09/2016  . Elevated blood pressure reading without diagnosis of hypertension   . History of chicken pox   . Hypertension   . Mild hyperlipidemia 03/18/2012  . OSA (obstructive sleep apnea) 03/25/2012   Severe OSA per sleep study 10/13.   Marland Kitchen Palpitations   . PCOS (polycystic ovarian syndrome)     Past Surgical History: Past Surgical History:  Procedure Laterality Date  . ADENOIDECTOMY  1989  . DILATION AND CURETTAGE OF UTERUS    . TONSILLECTOMY    . WISDOM TOOTH EXTRACTION       Allergies:   Allergies  Allergen Reactions  . Flagyl [Metronidazole Hcl]     Chalky and coated tongue  . Metformin And Related Other (See Comments)    Nausea, diarrhea  . Spironolactone     Increased menstrual bleeding.     Social History:  reports that she has never smoked. She has never used smokeless tobacco. She reports current alcohol use. She reports that she does not use drugs.   Family History: Family History  Problem Relation Age of Onset  . Heart disease Mother        CHF  . Hypertension Mother   . Depression Mother   . Asthma Mother   . Polycystic ovary syndrome  Mother   . Arthritis Father   . Cancer Other        aunt  . Diabetes Maternal Aunt   . Arthritis Maternal Uncle   . Diabetes Paternal Uncle   . Arthritis Maternal Grandmother   . Cancer Maternal Grandmother        lung  . Arthritis Paternal Grandmother   . Cancer Paternal Grandmother        breast  . Stroke Paternal Grandmother   . Diabetes Paternal Grandmother   . Cancer Paternal Grandfather        colon and prostate  . Cancer Paternal Aunt 68       breast     Physical Exam: Vitals:   07/14/20 0430 07/14/20 0451 07/14/20 0540 07/14/20 0600  BP: 132/87 132/87 (!) 138/104 (!) 146/89  Pulse: 89 71 93  83  Resp: 15 17 19 19   Temp:  98.1 F (36.7 C)    TempSrc:  Oral    SpO2: 98% 99% 98% 98%  Weight:      Height:        Physical Exam Vitals and nursing note reviewed.  Constitutional:      Appearance: Normal appearance.  HENT:     Head: Normocephalic and atraumatic.  Eyes:     Conjunctiva/sclera: Conjunctivae normal.  Cardiovascular:     Rate and Rhythm: Regular rhythm. Tachycardia present.  Pulmonary:     Effort: Pulmonary effort is normal.     Breath sounds: Normal breath sounds.  Abdominal:     General: Abdomen is flat. There is no distension.     Palpations: Abdomen is soft.     Tenderness: There is abdominal tenderness.  Musculoskeletal:        General: No swelling or tenderness.  Skin:    Coloration: Skin is not jaundiced or pale.  Neurological:     Mental Status: She is alert. Mental status is at baseline.  Psychiatric:        Mood and Affect: Mood normal.        Behavior: Behavior normal.       Data reviewed:  I have personally reviewed following labs and imaging studies Labs:  CBC: Recent Labs  Lab 07/14/20 0101  WBC 12.6*  NEUTROABS 10.4*  HGB 15.2*  HCT 44.4  MCV 93.3  PLT 323    Basic Metabolic Panel: Recent Labs  Lab 07/14/20 0101  NA 138  K 3.7  CL 106  CO2 19*  GLUCOSE 125*  BUN 7  CREATININE 0.72  CALCIUM 9.4   GFR Estimated Creatinine Clearance: 128.7 mL/min (by C-G formula based on SCr of 0.72 mg/dL). Liver Function Tests: Recent Labs  Lab 07/14/20 0101  AST 261*  ALT 322*  ALKPHOS 137*  BILITOT 8.7*  PROT 8.3*  ALBUMIN 3.7   Recent Labs  Lab 07/14/20 0101  LIPASE 33   No results for input(s): AMMONIA in the last 168 hours. Coagulation profile No results for input(s): INR, PROTIME in the last 168 hours.  Cardiac Enzymes: No results for input(s): CKTOTAL, CKMB, CKMBINDEX, TROPONINI in the last 168 hours. BNP: Invalid input(s): POCBNP CBG: No results for input(s): GLUCAP in the last 168 hours. D-Dimer No  results for input(s): DDIMER in the last 72 hours. Hgb A1c No results for input(s): HGBA1C in the last 72 hours. Lipid Profile No results for input(s): CHOL, HDL, LDLCALC, TRIG, CHOLHDL, LDLDIRECT in the last 72 hours. Thyroid function studies No results for input(s): TSH, T4TOTAL, T3FREE, THYROIDAB in  the last 72 hours.  Invalid input(s): FREET3 Anemia work up No results for input(s): VITAMINB12, FOLATE, FERRITIN, TIBC, IRON, RETICCTPCT in the last 72 hours. Urinalysis    Component Value Date/Time   COLORURINE AMBER (A) 07/14/2020 0350   APPEARANCEUR CLEAR 07/14/2020 0350   LABSPEC 1.010 07/14/2020 0350   PHURINE 5.0 07/14/2020 0350   GLUCOSEU NEGATIVE 07/14/2020 0350   GLUCOSEU NEGATIVE 05/14/2018 0835   HGBUR NEGATIVE 07/14/2020 0350   BILIRUBINUR LARGE (A) 07/14/2020 0350   KETONESUR 40 (A) 07/14/2020 0350   PROTEINUR NEGATIVE 07/14/2020 0350   UROBILINOGEN 1.0 05/14/2018 0835   NITRITE NEGATIVE 07/14/2020 0350   LEUKOCYTESUR NEGATIVE 07/14/2020 0350     Microbiology Recent Results (from the past 240 hour(s))  SARS Coronavirus 2 by RT PCR (hospital order, performed in [facility] hospital lab) Nasopharyngeal Nasopharyngeal Swab     Status: Abnormal   Collection Time: 07/14/20  3:50 AM   Specimen: Nasopharyngeal Swab  Result Value Ref Range Status   SARS Coronavirus 2 POSITIVE (A) NEGATIVE Final    Comment: RESULT CALLED TO, READ BACK BY AND VERIFIED WITH: ADKINS,L AT 5427 ON [id number] BY CHERESNOWSKY,T (NOTE) SARS-CoV-2 target nucleic acids are DETECTED  SARS-CoV-2 RNA is generally detectable in upper respiratory specimens  during the acute phase of infection.  Positive results are indicative  of the presence of the identified virus, but do not rule out bacterial infection or co-infection with other pathogens not detected by the test.  Clinical correlation with patient history and  other diagnostic information is necessary to determine patient infection status.  The  expected result is negative.  Fact Sheet for Patients:   BoilerBrush.com.cy   Fact Sheet for Healthcare Providers:   https://pope.com/    This test is not yet approved or cleared by the [place] FDA and  has been authorized for detection and/or diagnosis of SARS-CoV-2 by FDA under an Emergency Use Authorization (EUA).  This EUA will remain in effect (meani ng this test can be used) for the duration of  the COVID-19 declaration under Section 564(b)(1) of the Act, 21 U.S.C. section 360-bbb-3(b)(1), unless the authorization is terminated or revoked sooner.  Performed at [facility], [address] [place], [place] [address]        Inpatient Medications:   Scheduled Meds: . cyanocobalamin  1,000 mcg Intramuscular Q30 days   Continuous Infusions:   Radiological Exams on Admission: CT ABDOMEN PELVIS W CONTRAST  Result Date: 07/14/2020 CLINICAL DATA:  Right upper quadrant abdominal pain. Nausea and vomiting. Jaundice. EXAM: CT ABDOMEN AND PELVIS WITH CONTRAST TECHNIQUE: Multidetector CT imaging of the abdomen and pelvis was performed using the standard protocol following bolus administration of intravenous contrast. CONTRAST:  OMNIPAQUE IOHEXOL 350 MG/ML SOLN COMPARISON:  Ultrasound abdomen 09/04/2014 FINDINGS: Lower chest: Lung bases are clear. Hepatobiliary: Simple appearing cyst in the lateral segment left lobe of liver. This was present on the previous ultrasound and likely benign. No other focal lesions. Multiple stones are demonstrated in the gallbladder with gallbladder wall thickening and edema. Intra and extrahepatic bile duct dilatation. Stone demonstrated in the distal common bile duct. Pancreas: Unremarkable. No pancreatic ductal dilatation or surrounding inflammatory changes. Spleen: Normal in size without focal abnormality. Adrenals/Urinary Tract: Adrenal glands are unremarkable. Kidneys are normal,  without renal calculi, focal lesion, or hydronephrosis. Bladder is unremarkable. Stomach/Bowel: Stomach is within normal limits. Appendix appears normal. No evidence of bowel wall thickening, distention, or inflammatory changes. Vascular/Lymphatic: No significant vascular findings are  present. No enlarged abdominal or pelvic lymph nodes. Reproductive: Uterus and bilateral adnexa are unremarkable. Other: No abdominal wall hernia or abnormality. No abdominopelvic ascites. Musculoskeletal: No acute or significant osseous findings. IMPRESSION: 1. Cholelithiasis with gallbladder wall thickening and edema consistent with acute cholecystitis. Stone demonstrated in the distal common bile duct with intra and extrahepatic bile duct dilatation. 2. Simple appearing cyst in the left lobe of liver. This was present on the previous ultrasound and likely benign. Electronically Signed   By: Burman Nieves M.D.   On: 07/14/2020 03:20   DG Chest Portable 1 View  Result Date: 07/14/2020 CLINICAL DATA:  Dyspnea, vomiting EXAM: PORTABLE CHEST 1 VIEW COMPARISON:  05/15/2011 FINDINGS: The heart size and mediastinal contours are within normal limits. Both lungs are clear. The visualized skeletal structures are unremarkable. IMPRESSION: No active disease. Electronically Signed   By: Helyn Numbers MD   On: 07/14/2020 01:36    Impression/Recommendations Active Problems:   * No active hospital problems. *    1. Acute Cholecystitis a. Per primary team  2. COVID 19 positive  a. Symptom onset and positive test on 1/10, with resolution of initial symptoms and positive PCR on 1/27 b. Cycle Threshold: 41.6 c. Patient does not need isolation precautions or treatment at this time  3. OSA a. Not on CPAP at home  4. Hypertension a. Continue home Toprol XL  5. History of Sinus Tachycardia a. Continue home Toprol XL b. Telemetry  6. Depression a. Continue home Wellbutrin XL  7. Morbid Obesity a. Body mass index is  52.35 kg/m. b. Outpatient follow up    Thank you for this consultation.  Our [facility] hospitalist team will follow the patient with you.   Time Spent: 65 minutes  Jae Dire D.O.  [facility] 07/14/2020, 7:48 AM

## 2020-07-14 NOTE — ED Provider Notes (Signed)
MSE was initiated and I personally evaluated the patient and placed orders (if any) at  5:28 AM on July 14, 2020.  The patient appears stable so that the remainder of the MSE may be completed by another provider.   Accepted in transfer from [facility] to be seen by general surgery.  Patient with nausea and vomiting and fatigue.  Also noted to be jaundiced.    NCAT EOMI RRR CTAB NABS  Results for orders placed or performed during the hospital encounter of 07/14/20  SARS Coronavirus 2 by RT PCR (hospital order, performed in [facility] hospital lab) Nasopharyngeal Nasopharyngeal Swab   Specimen: Nasopharyngeal Swab  Result Value Ref Range   SARS Coronavirus 2 POSITIVE (A) NEGATIVE  CBC with Differential  Result Value Ref Range   WBC 12.6 (H) 4.0 - 10.5 K/uL   RBC 4.76 3.87 - 5.11 MIL/uL   Hemoglobin 15.2 (H) 12.0 - 15.0 g/dL   HCT 37.9 02.4 - 09.7 %   MCV 93.3 80.0 - 100.0 fL   MCH 31.9 26.0 - 34.0 pg   MCHC 34.2 30.0 - 36.0 g/dL   RDW 35.3 29.9 - 24.2 %   Platelets 323 150 - 400 K/uL   nRBC 0.0 0.0 - 0.2 %   Neutrophils Relative % 83 %   Neutro Abs 10.4 (H) 1.7 - 7.7 K/uL   Lymphocytes Relative 10 %   Lymphs Abs 1.2 0.7 - 4.0 K/uL   Monocytes Relative 6 %   Monocytes Absolute 0.8 0.1 - 1.0 K/uL   Eosinophils Relative 1 %   Eosinophils Absolute 0.1 0.0 - 0.5 K/uL   Basophils Relative 0 %   Basophils Absolute 0.0 0.0 - 0.1 K/uL   Immature Granulocytes 0 %   Abs Immature Granulocytes 0.05 0.00 - 0.07 K/uL  Comprehensive metabolic panel  Result Value Ref Range   Sodium 138 135 - 145 mmol/L   Potassium 3.7 3.5 - 5.1 mmol/L   Chloride 106 98 - 111 mmol/L   CO2 19 (L) 22 - 32 mmol/L   Glucose, Bld 125 (H) 70 - 99 mg/dL   BUN 7 6 - 20 mg/dL   Creatinine, Ser 6.83 0.44 - 1.00 mg/dL   Calcium 9.4 8.9 - 41.9 mg/dL   Total Protein 8.3 (H) 6.5 - 8.1 g/dL   Albumin 3.7 3.5 - 5.0 g/dL   AST 622 (H) 15 - 41 U/L   ALT 322 (H) 0 - 44 U/L   Alkaline Phosphatase 137 (H) 38 - 126 U/L    Total Bilirubin 8.7 (H) 0.3 - 1.2 mg/dL   GFR, Estimated >29 >79 mL/min   Anion gap 13 5 - 15  Urinalysis, Routine w reflex microscopic  Result Value Ref Range   Color, Urine AMBER (A) YELLOW   APPearance CLEAR CLEAR   Specific Gravity, Urine 1.010 1.005 - 1.030   pH 5.0 5.0 - 8.0   Glucose, UA NEGATIVE NEGATIVE mg/dL   Hgb urine dipstick NEGATIVE NEGATIVE   Bilirubin Urine LARGE (A) NEGATIVE   Ketones, ur 40 (A) NEGATIVE mg/dL   Protein, ur NEGATIVE NEGATIVE mg/dL   Nitrite NEGATIVE NEGATIVE   Leukocytes,Ua NEGATIVE NEGATIVE  Lipase, blood  Result Value Ref Range   Lipase 33 11 - 51 U/L  Pregnancy, urine  Result Value Ref Range   Preg Test, Ur NEGATIVE NEGATIVE   CT ABDOMEN PELVIS W CONTRAST  Result Date: 07/14/2020 CLINICAL DATA:  Right upper quadrant abdominal pain. Nausea and vomiting. Jaundice. EXAM: CT ABDOMEN AND  PELVIS WITH CONTRAST TECHNIQUE: Multidetector CT imaging of the abdomen and pelvis was performed using the standard protocol following bolus administration of intravenous contrast. CONTRAST:  OMNIPAQUE IOHEXOL 350 MG/ML SOLN COMPARISON:  Ultrasound abdomen 09/04/2014 FINDINGS: Lower chest: Lung bases are clear. Hepatobiliary: Simple appearing cyst in the lateral segment left lobe of liver. This was present on the previous ultrasound and likely benign. No other focal lesions. Multiple stones are demonstrated in the gallbladder with gallbladder wall thickening and edema. Intra and extrahepatic bile duct dilatation. Stone demonstrated in the distal common bile duct. Pancreas: Unremarkable. No pancreatic ductal dilatation or surrounding inflammatory changes. Spleen: Normal in size without focal abnormality. Adrenals/Urinary Tract: Adrenal glands are unremarkable. Kidneys are normal, without renal calculi, focal lesion, or hydronephrosis. Bladder is unremarkable. Stomach/Bowel: Stomach is within normal limits. Appendix appears normal. No evidence of bowel wall thickening,  distention, or inflammatory changes. Vascular/Lymphatic: No significant vascular findings are present. No enlarged abdominal or pelvic lymph nodes. Reproductive: Uterus and bilateral adnexa are unremarkable. Other: No abdominal wall hernia or abnormality. No abdominopelvic ascites. Musculoskeletal: No acute or significant osseous findings. IMPRESSION: 1. Cholelithiasis with gallbladder wall thickening and edema consistent with acute cholecystitis. Stone demonstrated in the distal common bile duct with intra and extrahepatic bile duct dilatation. 2. Simple appearing cyst in the left lobe of liver. This was present on the previous ultrasound and likely benign. Electronically Signed   By: Burman Nieves M.D.   On: 07/14/2020 03:20   DG Chest Portable 1 View  Result Date: 07/14/2020 CLINICAL DATA:  Dyspnea, vomiting EXAM: PORTABLE CHEST 1 VIEW COMPARISON:  05/15/2011 FINDINGS: The heart size and mediastinal contours are within normal limits. Both lungs are clear. The visualized skeletal structures are unremarkable. IMPRESSION: No active disease. Electronically Signed   By: Helyn Numbers MD   On: 07/14/2020 01:36   603 case d/w Dr. Carolynne Edouard, to be seen by morning CCS team    Maryjo Ragon, MD 07/14/20 0814

## 2020-07-14 NOTE — ED Notes (Signed)
Attempted to give report to RN on 5W, but RN is busy at this time.

## 2020-07-14 NOTE — ED Notes (Signed)
Pt BIB [facility] from Home with N/V for 3 days.  Pt has been having N/V for 3 days and has had very low PO intake because of same.  Pt also states she has been having Mid/Lower Back and Epigastric Pain.  VSS with EMS but pt tachycardic at 120 ST.  GCS 15, A&Ox4.

## 2020-07-14 NOTE — ED Notes (Signed)
[facility] at Bedside; Report given to same.  

## 2020-07-14 NOTE — ED Notes (Signed)
Patient received dinner tray 

## 2020-07-14 NOTE — ED Notes (Signed)
Care Handoff/Report given to [facility]. All questions answered.

## 2020-07-14 NOTE — Consult Note (Signed)
Referring Provider:  [facility]         Primary Care Physician:  [name], NP Primary Gastroenterologist:   none          We were asked to see this patient for:   Bile duct stone               ASSESSMENT / PLAN:   # 40 yo female with acute cholecystitis / choledocholithiasis. Mild leukocytosis --Surgery admitting patient, gettng Zosyn --Patient will need ERCP with extraction of CBD stone. The risks / benefits of the procedure were discussed, including but not limited to infection, bleeding perforation, and pancreatitis. Patient agrees to proceed with ERCP. Will need to secure date / time for procedure. Supportive care in the interim.   # COVID19, originally tested positive on 06/27/20. Her PCP is positive today, Internal Medicine will evalute to see if patient needs to be treated as COVID positive.   # Tachycardia, to be placed on telemetry.    # PCOS, on BCP     HPI:                                                                                                                             Chief Complaint:  Abdominal pain,nausea  Tina Figueroa is a 41 y.o. female with PMH of COVID19 Jan 2022, PCOS, OSA, HTN, cholelithiasis, depression   Patient developed nasal congestion , nausea, vomiting, and diarrhea on 10th of this month. A rapid COVID19 test was negative but Urgent Care sent off PCR which was positive.  She returned to work on 1/24 but still wasn't feeling well. She has been having nausea, vomiting and diffuse upper abdominal pain radiating around both sides to her back. Initially drawing her legs to her chest helped . She has had low grade fevers and chills. She hasn't been eating much due to her symptoms. Her last BM was several days ago. Patient presented to ED yesterday with ongoing symptoms.  EKG showed sinus tachycardia. CXR negative for acute findings. WBC 12.6, hgb 15.2, alk phos 137, AST 261, ALT 322, Bili 8.7 (Liver tests historically normal). Lipase  normal.  CT scan remarkable for acute cholecystitis and distal distal CBD stone with intra / extrahepatic biliary duct dilation. Pancreas normal.   Patient recalls having a similar episode of abdominal pain, nausea, diarrhea back in October. Symptoms lasted over the weekend. Patient rarely consumes Etoh. She does not use tobacco.    PREVIOUS ENDOSCOPIC EVALUATIONS / PERTINENT STUDIES   07/14/20 CT scan  abd/ pelvis with contrast -IMPRESSION: 1. Cholelithiasis with gallbladder wall thickening and edema consistent with acute cholecystitis. Stone demonstrated in the distal common bile duct with intra and extrahepatic bile duct dilatation. 2. Simple appearing cyst in the left lobe of liver. This was present on the previous ultrasound and likely benign  Past Medical History:  Diagnosis Date  . B12 deficiency 04/09/2016  . Elevated blood pressure reading without diagnosis  of hypertension   . History of chicken pox   . Hypertension   . Mild hyperlipidemia 03/18/2012  . OSA (obstructive sleep apnea) 03/25/2012   Severe OSA per sleep study 10/13.   Marland Kitchen Palpitations   . PCOS (polycystic ovarian syndrome)     Past Surgical History:  Procedure Laterality Date  . ADENOIDECTOMY  1989  . DILATION AND CURETTAGE OF UTERUS    . TONSILLECTOMY    . WISDOM TOOTH EXTRACTION      Prior to Admission medications   Medication Sig Start Date End Date Taking? Authorizing Provider  metoprolol succinate (TOPROL-XL) 50 MG 24 hr tablet Take 1 tablet (50 mg total) by mouth daily. Take with or immediately following a meal. 06/16/20  Yes [name], NP  norethindrone-ethinyl estradiol (JUNEL FE 1/20) 1-20 MG-MCG tablet Take 1 tablet by mouth daily. 06/08/19  Yes Lavonia Drafts, MD  buPROPion (WELLBUTRIN XL) 150 MG 24 hr tablet Take 1 tablet (150 mg total) by mouth daily. Patient not taking: Reported on 07/14/2020 06/03/20   [name], NP  escitalopram (LEXAPRO) 20 MG tablet Take 1  tablet (20 mg total) by mouth daily. Patient not taking: Reported on 07/14/2020 06/03/20   [name], NP  fluticasone [facility]) 50 MCG/ACT nasal spray Place 2 sprays into both nostrils daily. Patient not taking: Reported on 07/14/2020 11/20/19   [name], NP  meloxicam (MOBIC) 7.5 MG tablet TAKE 1 TABLET DAILY Patient not taking: No sig reported 10/05/19   [name], NP  montelukast (SINGULAIR) 10 MG tablet Take 1 tablet (10 mg total) by mouth at bedtime. Patient not taking: No sig reported 06/03/20   [name], NP  ondansetron (ZOFRAN) 4 MG tablet Take 1 tablet (4 mg total) by mouth every 8 (eight) hours as needed for nausea or vomiting. 07/13/20   [name], NP    Current Facility-Administered Medications  Medication Dose Route Frequency Provider Last Rate Last Admin  . 0.9 % NaCl with KCl 20 mEq/ L  infusion   Intravenous Continuous Earnstine Regal, PA-C      . cyanocobalamin ((VITAMIN B-12)) injection 1,000 mcg  1,000 mcg Intramuscular Q30 days [name], NP   1,000 mcg at 12/25/19 0848  . diphenhydrAMINE (BENADRYL) capsule 25 mg  25 mg Oral Q6H PRN Earnstine Regal, PA-C       Or  . diphenhydrAMINE (BENADRYL) injection 25 mg  25 mg Intravenous Q6H PRN Earnstine Regal, PA-C      . morphine 2 MG/ML injection 2-3 mg  2-3 mg Intravenous Q2H PRN Earnstine Regal, PA-C   2 mg at 07/14/20 1016  . ondansetron (ZOFRAN-ODT) disintegrating tablet 4 mg  4 mg Oral Q6H PRN Earnstine Regal, PA-C       Or  . ondansetron [facility]) injection 4 mg  4 mg Intravenous Q6H PRN Earnstine Regal, PA-C   4 mg at 07/14/20 1024  . piperacillin-tazobactam (ZOSYN) IVPB 3.375 g  3.375 g Intravenous Q8H Earnstine Regal, PA-C 12.5 mL/hr at 07/14/20 1009 3.375 g at 07/14/20 1009   Current Outpatient Medications  Medication Sig Dispense Refill  . metoprolol succinate (TOPROL-XL) 50 MG 24 hr tablet Take 1 tablet (50 mg total) by mouth daily. Take with or  immediately following a meal. 90 tablet 1  . norethindrone-ethinyl estradiol (JUNEL FE 1/20) 1-20 MG-MCG tablet Take 1 tablet by mouth daily. 3 Package 4  . buPROPion (WELLBUTRIN XL) 150 MG 24 hr tablet Take 1 tablet (150 mg total) by mouth daily. (Patient not taking: Reported  on 07/14/2020) 90 tablet 1  . escitalopram (LEXAPRO) 20 MG tablet Take 1 tablet (20 mg total) by mouth daily. (Patient not taking: Reported on 07/14/2020) 90 tablet 1  . fluticasone (FLONASE) 50 MCG/ACT nasal spray Place 2 sprays into both nostrils daily. (Patient not taking: Reported on 07/14/2020) 16 g 6  . meloxicam (MOBIC) 7.5 MG tablet TAKE 1 TABLET DAILY (Patient not taking: No sig reported) 90 tablet 3  . montelukast (SINGULAIR) 10 MG tablet Take 1 tablet (10 mg total) by mouth at bedtime. (Patient not taking: No sig reported) 90 tablet 1  . ondansetron (ZOFRAN) 4 MG tablet Take 1 tablet (4 mg total) by mouth every 8 (eight) hours as needed for nausea or vomiting. 20 tablet 0    Allergies as of 07/14/2020 - Review Complete 07/14/2020  Allergen Reaction Noted  . Flagyl [metronidazole hcl]  05/15/2011  . Metformin and related Other (See Comments) 04/09/2016  . Spironolactone  07/18/2015    Family History  Problem Relation Age of Onset  . Heart disease Mother        CHF  . Hypertension Mother   . Depression Mother   . Asthma Mother   . Polycystic ovary syndrome Mother   . Arthritis Father   . Cancer Other        aunt  . Diabetes Maternal Aunt   . Arthritis Maternal Uncle   . Diabetes Paternal Uncle   . Arthritis Maternal Grandmother   . Cancer Maternal Grandmother        lung  . Arthritis Paternal Grandmother   . Cancer Paternal Grandmother        breast  . Stroke Paternal Grandmother   . Diabetes Paternal Grandmother   . Cancer Paternal Grandfather        colon and prostate  . Cancer Paternal Aunt 43       breast    Review of Systems: All systems reviewed and negative except where noted in  HPI.  OBJECTIVE:    Physical Exam: Vital signs in last 24 hours: Temp:  [97.9 F (36.6 C)-98.1 F (36.7 C)] 98.1 F (36.7 C) (01/27 0451) Pulse Rate:  [71-138] 97 (01/27 1002) Resp:  [15-23] 23 (01/27 1002) BP: (124-148)/(78-104) 148/94 (01/27 1002) SpO2:  [97 %-99 %] 98 % (01/27 1002) Weight:  [138.3 kg] 138.3 kg (01/27 0053)   General:   Alert female in NAD Psych:  Pleasant, cooperative. Normal mood and affect. Eyes:  Pupils equal, sclera clear, no icterus.   Conjunctiva pink. Ears:  Normal auditory acuity. Nose:  No deformity, discharge,  or lesions. Neck:  Supple; no masses Lungs:  Clear throughout to auscultation.   No wheezes, crackles, or rhonchi.  Heart:  Regular rate and rhythm; no murmurs, no lower extremity edema Abdomen:  Soft, non-distended, moderate RUQ / epigastric tenderness, BS active, no palp mass   Rectal:  Deferred  Msk:  Symmetrical without gross deformities. . Neurologic:  Alert and  oriented x4;  grossly normal neurologically. Skin:  Intact without significant lesions or rashes.  Filed Weights   07/14/20 0053  Weight: (!) 138.3 kg     Scheduled inpatient medications . cyanocobalamin  1,000 mcg Intramuscular Q30 days      Intake/Output from previous day: 01/26 0701 - 01/27 0700 In: 990 [IV Piggyback:990] Out: -  Intake/Output this shift: No intake/output data recorded.   Lab Results: Recent Labs    07/14/20 0101  WBC 12.6*  HGB 15.2*  HCT 44.4  PLT 323  BMET Recent Labs    07/14/20 0101  NA 138  K 3.7  CL 106  CO2 19*  GLUCOSE 125*  BUN 7  CREATININE 0.72  CALCIUM 9.4   LFT Recent Labs    07/14/20 0101  PROT 8.3*  ALBUMIN 3.7  AST 261*  ALT 322*  ALKPHOS 137*  BILITOT 8.7*   PT/INR No results for input(s): LABPROT, INR in the last 72 hours. Hepatitis Panel No results for input(s): HEPBSAG, HCVAB, HEPAIGM, HEPBIGM in the last 72 hours.   . CBC Latest Ref Rng & Units 07/14/2020 05/20/2019 05/14/2018  WBC  4.0 - 10.5 K/uL 12.6(H) 8.2 8.7  Hemoglobin 12.0 - 15.0 g/dL 15.2(H) 13.8 14.5  Hematocrit 36.0 - 46.0 % 44.4 41.1 43.5  Platelets 150 - 400 K/uL 323 282.0 294.0    . CMP Latest Ref Rng & Units 07/14/2020 06/03/2020 05/20/2019  Glucose 70 - 99 mg/dL 125(H) 89 81  BUN 6 - 20 mg/dL 7 7 7   Creatinine 0.44 - 1.00 mg/dL 0.72 0.84 0.79  Sodium 135 - 145 mmol/L 138 139 139  Potassium 3.5 - 5.1 mmol/L 3.7 4.0 4.1  Chloride 98 - 111 mmol/L 106 106 106  CO2 22 - 32 mmol/L 19(L) 24 24  Calcium 8.9 - 10.3 mg/dL 9.4 9.0 8.7  Total Protein 6.5 - 8.1 g/dL 8.3(H) - 6.7  Total Bilirubin 0.3 - 1.2 mg/dL 8.7(H) - 0.8  Alkaline Phos 38 - 126 U/L 137(H) - 83  AST 15 - 41 U/L 261(H) - 13  ALT 0 - 44 U/L 322(H) - 14   Studies/Results: CT ABDOMEN PELVIS W CONTRAST  Result Date: 07/14/2020 CLINICAL DATA:  Right upper quadrant abdominal pain. Nausea and vomiting. Jaundice. EXAM: CT ABDOMEN AND PELVIS WITH CONTRAST TECHNIQUE: Multidetector CT imaging of the abdomen and pelvis was performed using the standard protocol following bolus administration of intravenous contrast. CONTRAST:  130m OMNIPAQUE IOHEXOL 350 MG/ML SOLN COMPARISON:  Ultrasound abdomen 09/04/2014 FINDINGS: Lower chest: Lung bases are clear. Hepatobiliary: Simple appearing cyst in the lateral segment left lobe of liver. This was present on the previous ultrasound and likely benign. No other focal lesions. Multiple stones are demonstrated in the gallbladder with gallbladder wall thickening and edema. Intra and extrahepatic bile duct dilatation. Stone demonstrated in the distal common bile duct. Pancreas: Unremarkable. No pancreatic ductal dilatation or surrounding inflammatory changes. Spleen: Normal in size without focal abnormality. Adrenals/Urinary Tract: Adrenal glands are unremarkable. Kidneys are normal, without renal calculi, focal lesion, or hydronephrosis. Bladder is unremarkable. Stomach/Bowel: Stomach is within normal limits. Appendix appears  normal. No evidence of bowel wall thickening, distention, or inflammatory changes. Vascular/Lymphatic: No significant vascular findings are present. No enlarged abdominal or pelvic lymph nodes. Reproductive: Uterus and bilateral adnexa are unremarkable. Other: No abdominal wall hernia or abnormality. No abdominopelvic ascites. Musculoskeletal: No acute or significant osseous findings. IMPRESSION: 1. Cholelithiasis with gallbladder wall thickening and edema consistent with acute cholecystitis. Stone demonstrated in the distal common bile duct with intra and extrahepatic bile duct dilatation. 2. Simple appearing cyst in the left lobe of liver. This was present on the previous ultrasound and likely benign. Electronically Signed   By: WLucienne CapersM.D.   On: 07/14/2020 03:20   DG Chest Portable 1 View  Result Date: 07/14/2020 CLINICAL DATA:  Dyspnea, vomiting EXAM: PORTABLE CHEST 1 VIEW COMPARISON:  05/15/2011 FINDINGS: The heart size and mediastinal contours are within normal limits. Both lungs are clear. The visualized skeletal structures are unremarkable. IMPRESSION: No active  disease. Electronically Signed   By: Fidela Salisbury MD   On: 07/14/2020 01:36    Active Problems:   Choledocholithiasis with acute cholecystitis    Tye Savoy, NP-C @  07/14/2020, 10:48 AM

## 2020-07-15 ENCOUNTER — Inpatient Hospital Stay (HOSPITAL_COMMUNITY): Payer: PRIVATE HEALTH INSURANCE | Admitting: Certified Registered Nurse Anesthetist

## 2020-07-15 ENCOUNTER — Encounter (HOSPITAL_COMMUNITY): Payer: Self-pay

## 2020-07-15 ENCOUNTER — Encounter (HOSPITAL_COMMUNITY): Admission: EM | Disposition: A | Discharge status: Home / Self Care | Payer: Self-pay | Source: Home / Self Care

## 2020-07-15 ENCOUNTER — Inpatient Hospital Stay (HOSPITAL_COMMUNITY): Payer: PRIVATE HEALTH INSURANCE

## 2020-07-15 DIAGNOSIS — K8062 Calculus of gallbladder and bile duct with acute cholecystitis without obstruction: Secondary | ICD-10-CM | POA: Diagnosis present

## 2020-07-15 DIAGNOSIS — Z793 Long term (current) use of hormonal contraceptives: Secondary | ICD-10-CM | POA: Diagnosis not present

## 2020-07-15 DIAGNOSIS — E282 Polycystic ovarian syndrome: Secondary | ICD-10-CM | POA: Diagnosis present

## 2020-07-15 DIAGNOSIS — K8000 Calculus of gallbladder with acute cholecystitis without obstruction: Secondary | ICD-10-CM | POA: Diagnosis present

## 2020-07-15 DIAGNOSIS — K8050 Calculus of bile duct without cholangitis or cholecystitis without obstruction: Secondary | ICD-10-CM | POA: Diagnosis not present

## 2020-07-15 DIAGNOSIS — K8043 Calculus of bile duct with acute cholecystitis with obstruction: Secondary | ICD-10-CM | POA: Diagnosis not present

## 2020-07-15 DIAGNOSIS — Z8616 Personal history of COVID-19: Secondary | ICD-10-CM | POA: Diagnosis not present

## 2020-07-15 DIAGNOSIS — K8051 Calculus of bile duct without cholangitis or cholecystitis with obstruction: Secondary | ICD-10-CM | POA: Diagnosis not present

## 2020-07-15 DIAGNOSIS — Z79899 Other long term (current) drug therapy: Secondary | ICD-10-CM | POA: Diagnosis not present

## 2020-07-15 DIAGNOSIS — G4733 Obstructive sleep apnea (adult) (pediatric): Secondary | ICD-10-CM | POA: Diagnosis present

## 2020-07-15 DIAGNOSIS — Z833 Family history of diabetes mellitus: Secondary | ICD-10-CM | POA: Diagnosis not present

## 2020-07-15 DIAGNOSIS — K2970 Gastritis, unspecified, without bleeding: Secondary | ICD-10-CM | POA: Diagnosis present

## 2020-07-15 DIAGNOSIS — I10 Essential (primary) hypertension: Secondary | ICD-10-CM | POA: Diagnosis present

## 2020-07-15 DIAGNOSIS — Z825 Family history of asthma and other chronic lower respiratory diseases: Secondary | ICD-10-CM | POA: Diagnosis not present

## 2020-07-15 DIAGNOSIS — K8042 Calculus of bile duct with acute cholecystitis without obstruction: Secondary | ICD-10-CM | POA: Diagnosis present

## 2020-07-15 DIAGNOSIS — Z8261 Family history of arthritis: Secondary | ICD-10-CM | POA: Diagnosis not present

## 2020-07-15 DIAGNOSIS — Z8249 Family history of ischemic heart disease and other diseases of the circulatory system: Secondary | ICD-10-CM | POA: Diagnosis not present

## 2020-07-15 DIAGNOSIS — F32A Depression, unspecified: Secondary | ICD-10-CM | POA: Diagnosis present

## 2020-07-15 DIAGNOSIS — Z6841 Body Mass Index (BMI) 40.0 and over, adult: Secondary | ICD-10-CM | POA: Diagnosis not present

## 2020-07-15 HISTORY — PX: ERCP: SHX5425

## 2020-07-15 HISTORY — DX: Calculus of gallbladder with acute cholecystitis without obstruction: K80.00

## 2020-07-15 LAB — CBC
HCT: 39.8 % (ref 36.0–46.0)
Hemoglobin: 13.1 g/dL (ref 12.0–15.0)
MCH: 32.1 pg (ref 26.0–34.0)
MCHC: 32.9 g/dL (ref 30.0–36.0)
MCV: 97.5 fL (ref 80.0–100.0)
Platelets: 307 10*3/uL (ref 150–400)
RBC: 4.08 MIL/uL (ref 3.87–5.11)
RDW: 12.2 % (ref 11.5–15.5)
WBC: 8.6 10*3/uL (ref 4.0–10.5)
nRBC: 0 % (ref 0.0–0.2)

## 2020-07-15 LAB — COMPREHENSIVE METABOLIC PANEL
ALT: 424 U/L — ABNORMAL HIGH (ref 0–44)
AST: 277 U/L — ABNORMAL HIGH (ref 15–41)
Albumin: 3 g/dL — ABNORMAL LOW (ref 3.5–5.0)
Alkaline Phosphatase: 121 U/L (ref 38–126)
Anion gap: 10 (ref 5–15)
BUN: <5 mg/dL — ABNORMAL LOW (ref 6–20)
CO2: 22 mmol/L (ref 22–32)
Calcium: 9 mg/dL (ref 8.9–10.3)
Chloride: 108 mmol/L (ref 98–111)
Creatinine, Ser: 0.87 mg/dL (ref 0.44–1.00)
GFR, Estimated: >60 mL/min (ref >60)
Glucose, Bld: 98 mg/dL (ref 70–99)
Potassium: 3.6 mmol/L (ref 3.5–5.1)
Sodium: 140 mmol/L (ref 135–145)
Total Bilirubin: 7.8 mg/dL — ABNORMAL HIGH (ref 0.3–1.2)
Total Protein: 7.1 g/dL (ref 6.5–8.1)

## 2020-07-15 LAB — LIPASE, BLOOD: Lipase: 26 U/L (ref 11–51)

## 2020-07-15 SURGERY — ERCP, WITH INTERVENTION IF INDICATED
Anesthesia: General

## 2020-07-15 MED ORDER — INDOMETHACIN 50 MG RE SUPP
RECTAL | Status: AC
  Filled 2020-07-15: qty 2

## 2020-07-15 MED ORDER — PROPOFOL 10 MG/ML IV BOLUS
INTRAVENOUS | Status: AC
  Filled 2020-07-15: qty 20

## 2020-07-15 MED ORDER — DEXAMETHASONE SODIUM PHOSPHATE 10 MG/ML IJ SOLN
INTRAMUSCULAR | Status: DC | PRN
  Administered 2020-07-15: 10 mg via INTRAVENOUS

## 2020-07-15 MED ORDER — ACETAMINOPHEN 500 MG PO TABS: 1000.0000 mg | ORAL_TABLET | Freq: Four times a day (QID) | ORAL | Status: DC | PRN

## 2020-07-15 MED ORDER — PROPOFOL 10 MG/ML IV BOLUS
INTRAVENOUS | Status: DC | PRN
  Administered 2020-07-15: 200 mg via INTRAVENOUS

## 2020-07-15 MED ORDER — LIDOCAINE 2% (20 MG/ML) 5 ML SYRINGE
INTRAMUSCULAR | Status: DC | PRN
  Administered 2020-07-15: 100 mg via INTRAVENOUS

## 2020-07-15 MED ORDER — ONDANSETRON HCL 4 MG/2ML IJ SOLN
4.0000 mg | Freq: Four times a day (QID) | INTRAMUSCULAR | Status: DC | PRN
  Administered 2020-07-16 – 2020-07-19 (×4): 4 mg via INTRAVENOUS
  Filled 2020-07-15 (×4): qty 2

## 2020-07-15 MED ORDER — SUCCINYLCHOLINE CHLORIDE 200 MG/10ML IV SOSY
PREFILLED_SYRINGE | INTRAVENOUS | Status: DC | PRN
  Administered 2020-07-15: 140 mg via INTRAVENOUS

## 2020-07-15 MED ORDER — METOPROLOL TARTRATE 5 MG/5ML IV SOLN
5.0000 mg | Freq: Once | INTRAVENOUS | Status: AC
  Administered 2020-07-15: 5 mg via INTRAVENOUS
  Filled 2020-07-15: qty 5

## 2020-07-15 MED ORDER — FENTANYL CITRATE (PF) 100 MCG/2ML IJ SOLN
INTRAMUSCULAR | Status: AC
  Filled 2020-07-15: qty 2

## 2020-07-15 MED ORDER — OXYCODONE HCL 5 MG PO TABS: 5.0000 mg | ORAL_TABLET | ORAL | Status: DC | PRN

## 2020-07-15 MED ORDER — SUGAMMADEX SODIUM 500 MG/5ML IV SOLN
INTRAVENOUS | Status: DC | PRN
  Administered 2020-07-15: 400 mg via INTRAVENOUS

## 2020-07-15 MED ORDER — PROMETHAZINE HCL 25 MG PO TABS: 12.5000 mg | ORAL_TABLET | ORAL | Status: DC | PRN

## 2020-07-15 MED ORDER — ONDANSETRON HCL 4 MG/2ML IJ SOLN
INTRAMUSCULAR | Status: DC | PRN
  Administered 2020-07-15: 4 mg via INTRAVENOUS

## 2020-07-15 MED ORDER — MIDAZOLAM HCL 5 MG/5ML IJ SOLN
INTRAMUSCULAR | Status: DC | PRN
  Administered 2020-07-15: 2 mg via INTRAVENOUS

## 2020-07-15 MED ORDER — INDOMETHACIN 50 MG RE SUPP: 100.0000 mg | Freq: Once | RECTAL | Status: AC

## 2020-07-15 MED ORDER — FENTANYL CITRATE (PF) 100 MCG/2ML IJ SOLN
INTRAMUSCULAR | Status: DC | PRN
  Administered 2020-07-15: 50 ug via INTRAVENOUS
  Administered 2020-07-15: 100 ug via INTRAVENOUS
  Administered 2020-07-15: 50 ug via INTRAVENOUS

## 2020-07-15 MED ORDER — POLYETHYLENE GLYCOL 3350 17 G PO PACK: 17.0000 g | PACK | Freq: Every day | ORAL | Status: DC | PRN

## 2020-07-15 MED ORDER — METOPROLOL SUCCINATE ER 50 MG PO TB24
50.0000 mg | ORAL_TABLET | Freq: Every day | ORAL | Status: DC
  Administered 2020-07-16 – 2020-07-20 (×3): 50 mg via ORAL
  Filled 2020-07-15 (×5): qty 1

## 2020-07-15 MED ORDER — MIDAZOLAM HCL 2 MG/2ML IJ SOLN
INTRAMUSCULAR | Status: AC
  Filled 2020-07-15: qty 2

## 2020-07-15 MED ORDER — ONDANSETRON 4 MG PO TBDP: 4.0000 mg | ORAL_TABLET | ORAL | Status: DC | PRN

## 2020-07-15 MED ORDER — LACTATED RINGERS IV SOLN: INTRAVENOUS | Status: DC | PRN

## 2020-07-15 MED ORDER — DOCUSATE SODIUM 100 MG PO CAPS
100.0000 mg | ORAL_CAPSULE | Freq: Two times a day (BID) | ORAL | Status: DC
  Filled 2020-07-15 (×2): qty 1

## 2020-07-15 MED ORDER — INDOMETHACIN 50 MG RE SUPP
RECTAL | Status: DC | PRN
  Administered 2020-07-15: 100 mg via RECTAL

## 2020-07-15 MED ORDER — SODIUM CHLORIDE 0.9 % IV SOLN
INTRAVENOUS | Status: DC | PRN
  Administered 2020-07-15: 100 mL

## 2020-07-15 MED ORDER — GLUCAGON HCL RDNA (DIAGNOSTIC) 1 MG IJ SOLR
INTRAMUSCULAR | Status: AC
  Filled 2020-07-15: qty 1

## 2020-07-15 MED ORDER — PROMETHAZINE HCL 25 MG/ML IJ SOLN
12.5000 mg | INTRAMUSCULAR | Status: DC | PRN
  Administered 2020-07-15: 12.5 mg via INTRAVENOUS
  Filled 2020-07-15: qty 1

## 2020-07-15 MED ORDER — MORPHINE SULFATE (PF) 2 MG/ML IV SOLN
2.0000 mg | INTRAVENOUS | Status: DC | PRN
  Administered 2020-07-16 – 2020-07-19 (×4): 2 mg via INTRAVENOUS
  Filled 2020-07-15 (×4): qty 1

## 2020-07-15 MED ORDER — ROCURONIUM BROMIDE 10 MG/ML (PF) SYRINGE
PREFILLED_SYRINGE | INTRAVENOUS | Status: DC | PRN
  Administered 2020-07-15: 40 mg via INTRAVENOUS
  Administered 2020-07-15: 20 mg via INTRAVENOUS

## 2020-07-15 NOTE — Anesthesia Postprocedure Evaluation (Signed)
Anesthesia Post Note  Patient: Tina Figueroa  Procedure(s) Performed: ENDOSCOPIC RETROGRADE CHOLANGIOPANCREATOGRAPHY (ERCP) (N/A )     Patient location during evaluation: PACU Anesthesia Type: General Level of consciousness: awake and alert and oriented Pain management: pain level controlled Vital Signs Assessment: post-procedure vital signs reviewed and stable Respiratory status: spontaneous breathing, nonlabored ventilation and respiratory function stable Cardiovascular status: blood pressure returned to baseline Postop Assessment: no apparent nausea or vomiting Anesthetic complications: no   No complications documented.  Last Vitals:  Vitals:   07/15/20 1708 07/15/20 1721  BP: (!) 156/99 (!) 166/112  Pulse: 93 87  Resp: 19 17  Temp: 37.1 C   SpO2: 97% 94%    Last Pain:  Vitals:   07/15/20 1721  TempSrc:   PainSc: 0-No pain                 Kaylyn Layer

## 2020-07-15 NOTE — Anesthesia Procedure Notes (Signed)
Procedure Name: Intubation Date/Time: 07/15/2020 2:59 PM Performed by: Gerald Leitz, CRNA Pre-anesthesia Checklist: Patient identified, Patient being monitored, Timeout performed, Emergency Drugs available and Suction available Patient Re-evaluated:Patient Re-evaluated prior to induction Oxygen Delivery Method: Circle system utilized Preoxygenation: Pre-oxygenation with 100% oxygen Induction Type: IV induction Ventilation: Mask ventilation without difficulty Laryngoscope Size: Mac and 4 Grade View: Grade I Tube type: Oral Tube size: 7.5 mm Number of attempts: 1 Airway Equipment and Method: Stylet Placement Confirmation: ETT inserted through vocal cords under direct vision,  positive ETCO2 and breath sounds checked- equal and bilateral Secured at: 23 cm Tube secured with: Tape Dental Injury: Teeth and Oropharynx as per pre-operative assessment

## 2020-07-15 NOTE — Transfer of Care (Signed)
Immediate Anesthesia Transfer of Care Note  Patient: Tina Figueroa  Procedure(s) Performed: Procedure(s): ENDOSCOPIC RETROGRADE CHOLANGIOPANCREATOGRAPHY (ERCP) (N/A)  Patient Location: PACU  Anesthesia Type:General  Level of Consciousness: Alert, Awake, Oriented  Airway & Oxygen Therapy: Patient Spontanous Breathing  Post-op Assessment: Report given to RN  Post vital signs: Reviewed and stable  Last Vitals:  Vitals:   07/15/20 1415 07/15/20 1452  BP: (!) 151/105 (!) 148/102  Pulse: 95 (!) 110  Resp: 18 10  Temp: 36.8 C (!) 36.4 C  SpO2: 98% 100%    Complications: No apparent anesthesia complications

## 2020-07-15 NOTE — Op Note (Signed)
[facility] Patient Name: Dezirea Figueroa Procedure Date: 07/15/2020 MRN: [id number] Attending MD: Wilhemina Bonito. Marina Goodell , MD Date of Birth: 15-Dec-1980 CSN: [id number] Age: 40 Admit Type: Inpatient Procedure:                ERCP Indications:              Abdominal pain of suspected biliary origin, Bile                            duct stone on Computed Tomogram Scan, Elevated                            liver enzymes Providers:                Wilhemina Bonito. Marina Goodell, MD, Margaree Mackintosh, RN,                            Dayton Bailiff, RN Referring MD:             [facility] Medicines:                General Anesthesia Complications:            No immediate complications. Estimated Blood Loss:     Estimated blood loss: none. Procedure:                Pre-Anesthesia Assessment:                           - Prior to the procedure, a History and Physical                            was performed, and patient medications and                            allergies were reviewed. The patient's tolerance of                            previous anesthesia was also reviewed. The risks                            and benefits of the procedure and the sedation                            options and risks were discussed with the patient.                            All questions were answered, and informed consent                            was obtained. Prior Anticoagulants: The patient has                            taken no previous anticoagulant or antiplatelet                            agents. ASA Grade  Assessment: II - A patient with                            mild systemic disease. After reviewing the risks                            and benefits, the patient was deemed in                            satisfactory condition to undergo the procedure.                           After obtaining informed consent, the scope was                            passed under direct vision. Throughout the                             procedure, the patient's blood pressure, pulse, and                            oxygen saturations were monitored continuously. The                            [id number]-Q180V ([id number]) Olympus Duodenoscope was                            introduced through the mouth, and used to inject                            contrast into without successful cannulation. The                            ERCP was technically difficult and complex. The                            patient tolerated the procedure well. Scope In: Scope Out: Findings:      The side-viewing endoscope was passed blindly into the esophagus.       Stomach, duodenum, and pulse bulbar duodenum grossly normal. The major       ampulla was bulging above the ampullary orifice suggesting an impacted       stone. Attempted cannulation were made over the course of 90 minutes.       Several cannulas wires were used. At no time did contrast or wire was       passed into either duct. At one point after manipulating the suspected       area of impacted stone above the ampulla purulent material emanated from       the ampullary orifice. I would have proceeded with precut if I had       noticed "crowning" of a stone at the ampullary orifice. Without such, no       attempt precut made.      No biopsies or other specimens were collected for this exam. Impression:  1. Failed biliary cannulation (see above)                           2. Suspected impacted stone with cholangitis Moderate Sedation:      none Recommendation:           1. Continue Zosyn                           2. IV hydration                           3. Full liquid diet                           4. Trend liver test                           5. Repeat ERCP on Monday with Dr. Meridee Score (has                            the capacity for precut if required)                           Discussed with patient. Procedure Code(s):        --- Professional ---                            [id number], Esophagogastroduodenoscopy, flexible,                            transoral; diagnostic, including collection of                            specimen(s) by brushing or washing, when performed                            (separate procedure) Diagnosis Code(s):        --- Professional ---                           R10.9, Unspecified abdominal pain                           K80.50, Calculus of bile duct without cholangitis                            or cholecystitis without obstruction                           R74.8, Abnormal levels of other serum enzymes CPT copyright 2019 American Medical Association. All rights reserved. The codes documented in this report are preliminary and upon coder review may  be revised to meet current compliance requirements. Wilhemina Bonito. Marina Goodell, MD 07/15/2020 5:29:54 PM This report has been signed electronically. Number of Addenda: 0

## 2020-07-15 NOTE — Progress Notes (Signed)
Day of Surgery  Subjective: CC: Still having some RUQ abdominal pain this morning. No n/v. Did well with CLD yesterday. Denies CP or SOB. Did not get out of bed yesterday.   Objective: Vital signs in last 24 hours: Temp:  [97.7 F (36.5 C)-98.1 F (36.7 C)] 97.9 F (36.6 C) (01/28 0424) Pulse Rate:  [72-137] 72 (01/28 0424) Resp:  [14-27] 18 (01/28 0424) BP: (140-175)/(81-117) 151/88 (01/28 0424) SpO2:  [93 %-100 %] 95 % (01/28 0424)    Intake/Output from previous day: 01/27 0701 - 01/28 0700 In: 1732 [I.V.:1586.5; IV Piggyback:145.4] Out: -  Intake/Output this shift: No intake/output data recorded.  PE: Gen:  Alert, NAD, pleasant HEENT: EOM's intact, pupils equal and round Card:  RRR Pulm:  CTAB, no W/R/R, effort normal Abd: Soft, obese, epigastric and RUQ tenderness, +BS Ext:  Calves equal in size b/l Psych: A&Ox4 Skin: no rashes noted, warm and dry   Lab Results:  Recent Labs    07/14/20 0101 07/15/20 0401  WBC 12.6* 8.6  HGB 15.2* 13.1  HCT 44.4 39.8  PLT 323 307   BMET Recent Labs    07/14/20 0101 07/15/20 0401  NA 138 140  K 3.7 3.6  CL 106 108  CO2 19* 22  GLUCOSE 125* 98  BUN 7 <5*  CREATININE 0.72 0.87  CALCIUM 9.4 9.0   PT/INR Recent Labs    07/14/20 1015  LABPROT 13.3  INR 1.1   CMP     Component Value Date/Time   NA 140 07/15/2020 0401   K 3.6 07/15/2020 0401   CL 108 07/15/2020 0401   CO2 22 07/15/2020 0401   GLUCOSE 98 07/15/2020 0401   BUN <5 (L) 07/15/2020 0401   CREATININE 0.87 07/15/2020 0401   CREATININE 0.74 12/17/2013 0938   CALCIUM 9.0 07/15/2020 0401   PROT 7.1 07/15/2020 0401   ALBUMIN 3.0 (L) 07/15/2020 0401   AST 277 (H) 07/15/2020 0401   ALT 424 (H) 07/15/2020 0401   ALKPHOS 121 07/15/2020 0401   BILITOT 7.8 (H) 07/15/2020 0401   GFRNONAA >60 07/15/2020 0401   GFRNONAA >89 03/09/2013 1213   GFRAA >89 03/09/2013 1213   Lipase     Component Value Date/Time   LIPASE 26 07/15/2020 0401        Studies/Results: CT ABDOMEN PELVIS W CONTRAST  Result Date: 07/14/2020 CLINICAL DATA:  Right upper quadrant abdominal pain. Nausea and vomiting. Jaundice. EXAM: CT ABDOMEN AND PELVIS WITH CONTRAST TECHNIQUE: Multidetector CT imaging of the abdomen and pelvis was performed using the standard protocol following bolus administration of intravenous contrast. CONTRAST:  OMNIPAQUE IOHEXOL 350 MG/ML SOLN COMPARISON:  Ultrasound abdomen 09/04/2014 FINDINGS: Lower chest: Lung bases are clear. Hepatobiliary: Simple appearing cyst in the lateral segment left lobe of liver. This was present on the previous ultrasound and likely benign. No other focal lesions. Multiple stones are demonstrated in the gallbladder with gallbladder wall thickening and edema. Intra and extrahepatic bile duct dilatation. Stone demonstrated in the distal common bile duct. Pancreas: Unremarkable. No pancreatic ductal dilatation or surrounding inflammatory changes. Spleen: Normal in size without focal abnormality. Adrenals/Urinary Tract: Adrenal glands are unremarkable. Kidneys are normal, without renal calculi, focal lesion, or hydronephrosis. Bladder is unremarkable. Stomach/Bowel: Stomach is within normal limits. Appendix appears normal. No evidence of bowel wall thickening, distention, or inflammatory changes. Vascular/Lymphatic: No significant vascular findings are present. No enlarged abdominal or pelvic lymph nodes. Reproductive: Uterus and bilateral adnexa are unremarkable. Other: No abdominal wall hernia  or abnormality. No abdominopelvic ascites. Musculoskeletal: No acute or significant osseous findings. IMPRESSION: 1. Cholelithiasis with gallbladder wall thickening and edema consistent with acute cholecystitis. Stone demonstrated in the distal common bile duct with intra and extrahepatic bile duct dilatation. 2. Simple appearing cyst in the left lobe of liver. This was present on the previous ultrasound and likely benign.  Electronically Signed   By: Burman Nieves M.D.   On: 07/14/2020 03:20   DG Chest Portable 1 View  Result Date: 07/14/2020 CLINICAL DATA:  Dyspnea, vomiting EXAM: PORTABLE CHEST 1 VIEW COMPARISON:  05/15/2011 FINDINGS: The heart size and mediastinal contours are within normal limits. Both lungs are clear. The visualized skeletal structures are unremarkable. IMPRESSION: No active disease. Electronically Signed   By: Helyn Numbers MD   On: 07/14/2020 01:36    Anti-infectives: Anti-infectives (From admission, onward)   Start     Dose/Rate Route Frequency Ordered Stop   07/14/20 1000  piperacillin-tazobactam (ZOSYN) IVPB 3.375 g        3.375 g 12.5 mL/hr over 240 Minutes Intravenous Every 8 hours 07/14/20 0907     07/14/20 0400  piperacillin-tazobactam (ZOSYN) IVPB 3.375 g        3.375 g 100 mL/hr over 30 Minutes Intravenous  Once 07/14/20 0351 07/14/20 0451       Assessment/Plan Hx tachycardia - type unknown. Appreciate TRH assistance. HR reg this AM.  Hypertension - restart home meds  OSA Polycystic ovarian syndrome Covid positive - 06/27/20 - discussed with TRH, okay to come off precautions.  BMI 52.3  Acute Cholecystitis Choledocholithiasis w/ elevated LFT's  - ERCP today with GI - Plan for Lap Chole over the weekend. I have explained the procedure, risks, and aftercare of cholecystectomy.  Risks include but are not limited to bleeding, infection, wound problems, anesthesia, diarrhea, bile leak, injury to common bile duct/liver/intestine.  She seems to understand and agrees to proceed. - Cont IV abx - Pulm toilet, mobilize   FEN: IVF, NPO for ERCP. Okay for CLD after. NPO at midnight.  ID:  Zosyn DVT:  SCDs, start Lovenox when okay with GI    LOS: 0 days    Jacinto Halim , [facility] 07/15/2020, 8:37 AM Please see Amion for pager number during day hours 7:00am-4:30pm

## 2020-07-15 NOTE — Progress Notes (Addendum)
Patient arrived back to room 1530. Upon arrival patient is experiencing nausea and vomiting. To early for Zofran. AMION page sent to on call general surgery provider Matt Tsuei. I will continue to monitor.

## 2020-07-15 NOTE — Anesthesia Preprocedure Evaluation (Addendum)
Anesthesia Evaluation  Patient identified by MRN, date of birth, ID band Patient awake    Reviewed: Allergy & Precautions, NPO status , Patient's Chart, lab work & pertinent test results, reviewed documented beta blocker date and time   History of Anesthesia Complications Negative for: history of anesthetic complications  Airway Mallampati: II  TM Distance: >3 FB Neck ROM: Full    Dental no notable dental hx.    Pulmonary sleep apnea and Continuous Positive Airway Pressure Ventilation ,  COVID+ 07/14/20   Pulmonary exam normal        Cardiovascular hypertension, Pt. on home beta blockers and Pt. on medications Normal cardiovascular exam     Neuro/Psych Depression negative neurological ROS     GI/Hepatic Neg liver ROS, GERD  Controlled,  Endo/Other  Morbid obesity (BMI 52)  Renal/GU negative Renal ROS  negative genitourinary   Musculoskeletal negative musculoskeletal ROS (+)   Abdominal   Peds  Hematology negative hematology ROS (+)   Anesthesia Other Findings Day of surgery medications reviewed with patient.  Reproductive/Obstetrics negative OB ROS                           Anesthesia Physical Anesthesia Plan  ASA: III and emergent  Anesthesia Plan: General   Post-op Pain Management:    Induction: Intravenous, Cricoid pressure planned and Rapid sequence  PONV Risk Score and Plan: 3 and Treatment may vary due to age or medical condition, Midazolam, Ondansetron and Dexamethasone  Airway Management Planned: Oral ETT  Additional Equipment: None  Intra-op Plan:   Post-operative Plan: Extubation in OR  Informed Consent: I have reviewed the patients History and Physical, chart, labs and discussed the procedure including the risks, benefits and alternatives for the proposed anesthesia with the patient or authorized representative who has indicated his/her understanding and acceptance.      Dental advisory given  Plan Discussed with: CRNA  Anesthesia Plan Comments: (Patient had one spoonful of applesauce with a pill at approximately noon today. Discussed case with Dr. Marina Goodell who states procedure cannot wait for adequate NPO status and needs to proceed emergently. Stephannie Peters, MD)       Anesthesia Quick Evaluation

## 2020-07-15 NOTE — Progress Notes (Signed)
PROGRESS NOTE    Tina Figueroa  WUJ:[id number] DOB: 1980/12/13 DOA: 07/14/2020 PCP: Sandford Craze, NP  Chief Complaint  Patient presents with  . Vomiting    Brief Narrative:  This is Tina Figueroa 40 y.o. female who has been vaccinated against COVID 19 with Tina Figueroa past medical history of hypertension, sinus tachycardia, vitamin B12 deficiency, OSA, Morbid obesity who presented to [facility] ED on 1/27 for nausea vomiting x3 days with poor p.o. intake.  Of note, patient developed abdominal pain, myalgias, N/V/D and nasal congestion on 1/10 and tested positive for COVID 19 on 1/10 (see test result below).  She had Tina Figueroa negative test on 1/19, returned to work on 1/20 but stayed home on 1/21 due to fatigue. She then returned to work on Monday, 1/24, but had nausea, vomiting and poor p.o. intake starting on 1/25. She had Tina Figueroa video visit with her PCP on 1/26 but had persistent symptoms so presented to the ED. She states that her initial COVID symptoms have resolved and her presenting symptoms are different than her symptoms on the 10th.   In the ED she is afebrile, tachycardic and tolerating room air.  Notable labs: Sodium 138, K3.7, CO2 19, glucose 125, BUN 7, creatinine 0.72, alkaline phosphatase 137, lipase 33, AST 261, ALT 322, T bili 8.7, WBC 12.6, Hb 15.2 and COVID-19 PCR positive.  CXR unremarkable.  CT abdomen pelvis with contrast: Acute cholecystitis with cholelithiasis and choledocholithiasis.  She was transferred to [facility] for further evaluation and admitted by general surgery. TRH was consulted by general surgery given her COVID 19 positive PCR.   Assessment & Plan:   Active Problems:   Choledocholithiasis with acute cholecystitis   Cholelithiasis with acute cholecystitis  Will sign off.  Can come off precautions for covid as now > 14 days after initial test (see media for image of test).  Please let us know if any additional questions or concerns.  1. Acute Cholecystitis  Choledocholithiasis Tina Figueroa. Per primary  team b. CT abd/pelvis with cholecystitis and distal common bile duct stone with intra and extrahepatic bile duct c. Surgery primary team, GI planning ERCP  2. COVID 19 positive  Tina Figueroa. Symptom onset and positive test on 1/10, with resolution of initial symptoms and positive PCR on 1/27 b. Cycle Threshold: 41.6 c. Patient does not need isolation precautions or treatment at this time as she's >14 days from her positive test with mild initial disease.  3. OSA Tina Figueroa. Not on CPAP at home  4. Hypertension Tina Figueroa. Continue home Toprol XL  5. History of Sinus Tachycardia Tina Figueroa. Continue home Toprol XL b. Telemetry  7. Morbid Obesity Tina Figueroa. Body mass index is 52.35 kg/m. b. Outpatient follow up  DVT prophylaxis: SCD Code Status: full Family Communication: none at bedside   Consultants:   [facility] consulting   GI consult  Surgery primary  Procedures:   none  Antimicrobials: Anti-infectives (From admission, onward)   Start     Dose/Rate Route Frequency Ordered Stop   07/14/20 1000  [MAR Hold]  piperacillin-tazobactam (ZOSYN) IVPB 3.375 g        (MAR Hold since Fri 07/15/2020 at 1452.Hold Reason: Transfer to [facility] Procedural area.)   3.375 g 12.5 mL/hr over 240 Minutes Intravenous Every 8 hours 07/14/20 0907     07/14/20 0400  piperacillin-tazobactam (ZOSYN) IVPB 3.375 g        3.375 g 100 mL/hr over 30 Minutes Intravenous  Once 07/14/20 0351 07/14/20 0451     Subjective: No new complaints Awaiting  procedure  Objective: Vitals:   07/15/20 1006 07/15/20 1326 07/15/20 1415 07/15/20 1452  BP: (!) 145/96 (!) 158/119 (!) 151/105 (!) 148/102  Pulse: 90 (!) 104 95 (!) 110  Resp:   18 10  Temp:   98.2 F (36.8 C) (!) 97.5 F (36.4 C)  TempSrc:   Oral Oral  SpO2: 98% 99% 98% 100%  Weight:      Height:        Intake/Output Summary (Last 24 hours) at 07/15/2020 1508 Last data filed at 07/15/2020 0700 Gross per 24 hour  Intake 1681.98 ml  Output --  Net 1681.98 ml   Filed Weights   07/14/20  0053  Weight: (!) 138.3 kg    Examination:  General exam: Appears calm and comfortable  Respiratory system: unlabored Central nervous system: Alert and oriented. No focal neurological deficits. Extremities: no LEE Skin: No rashes, lesions or ulcers Psychiatry: Judgement and insight appear normal. Mood & affect appropriate.     Data Reviewed: I have personally reviewed following labs and imaging studies  CBC: Recent Labs  Lab 07/14/20 0101 07/15/20 0401  WBC 12.6* 8.6  NEUTROABS 10.4*  --   HGB 15.2* 13.1  HCT 44.4 39.8  MCV 93.3 97.5  PLT 323 307    Basic Metabolic Panel: Recent Labs  Lab 07/14/20 0101 07/15/20 0401  NA 138 140  K 3.7 3.6  CL 106 108  CO2 19* 22  GLUCOSE 125* 98  BUN 7 <5*  CREATININE 0.72 0.87  CALCIUM 9.4 9.0    GFR: Estimated Creatinine Clearance: 118.4 mL/min (by C-G formula based on SCr of 0.87 mg/dL).  Liver Function Tests: Recent Labs  Lab 07/14/20 0101 07/15/20 0401  AST 261* 277*  ALT 322* 424*  ALKPHOS 137* 121  BILITOT 8.7* 7.8*  PROT 8.3* 7.1  ALBUMIN 3.7 3.0*    CBG: No results for input(s): GLUCAP in the last 168 hours.   Recent Results (from the past 240 hour(s))  SARS Coronavirus 2 by RT PCR (hospital order, performed in [facility] hospital lab) Nasopharyngeal Nasopharyngeal Swab     Status: Abnormal   Collection Time: 07/14/20  3:50 AM   Specimen: Nasopharyngeal Swab  Result Value Ref Range Status   SARS Coronavirus 2 POSITIVE (Noreen Mackintosh) NEGATIVE Final    Comment: RESULT CALLED TO, READ BACK BY AND VERIFIED WITH: ADKINS,L AT [id number] ON [id number] BY CHERESNOWSKY,T (NOTE) SARS-CoV-2 target nucleic acids are DETECTED  SARS-CoV-2 RNA is generally detectable in upper respiratory specimens  during the acute phase of infection.  Positive results are indicative  of the presence of the identified virus, but do not rule out bacterial infection or co-infection with other pathogens not detected by the test.  Clinical  correlation with patient history and  other diagnostic information is necessary to determine patient infection status.  The expected result is negative.  Fact Sheet for Patients:   BoilerBrush.com.cy   Fact Sheet for Healthcare Providers:   https://pope.com/    This test is not yet approved or cleared by the [place] FDA and  has been authorized for detection and/or diagnosis of SARS-CoV-2 by FDA under an Emergency Use Authorization (EUA).  This EUA will remain in effect (meani ng this test can be used) for the duration of  the COVID-19 declaration under Section 564(b)(1) of the Act, 21 U.S.C. section 360-bbb-3(b)(1), unless the authorization is terminated or revoked sooner.  Performed at [facility], [address], [place], [place] [address]  Radiology Studies: CT ABDOMEN PELVIS W CONTRAST  Result Date: 07/14/2020 CLINICAL DATA:  Right upper quadrant abdominal pain. Nausea and vomiting. Jaundice. EXAM: CT ABDOMEN AND PELVIS WITH CONTRAST TECHNIQUE: Multidetector CT imaging of the abdomen and pelvis was performed using the standard protocol following bolus administration of intravenous contrast. CONTRAST:  OMNIPAQUE IOHEXOL 350 MG/ML SOLN COMPARISON:  Ultrasound abdomen 09/04/2014 FINDINGS: Lower chest: Lung bases are clear. Hepatobiliary: Simple appearing cyst in the lateral segment left lobe of liver. This was present on the previous ultrasound and likely benign. No other focal lesions. Multiple stones are demonstrated in the gallbladder with gallbladder wall thickening and edema. Intra and extrahepatic bile duct dilatation. Stone demonstrated in the distal common bile duct. Pancreas: Unremarkable. No pancreatic ductal dilatation or surrounding inflammatory changes. Spleen: Normal in size without focal abnormality. Adrenals/Urinary Tract: Adrenal glands are unremarkable. Kidneys are normal, without renal  calculi, focal lesion, or hydronephrosis. Bladder is unremarkable. Stomach/Bowel: Stomach is within normal limits. Appendix appears normal. No evidence of bowel wall thickening, distention, or inflammatory changes. Vascular/Lymphatic: No significant vascular findings are present. No enlarged abdominal or pelvic lymph nodes. Reproductive: Uterus and bilateral adnexa are unremarkable. Other: No abdominal wall hernia or abnormality. No abdominopelvic ascites. Musculoskeletal: No acute or significant osseous findings. IMPRESSION: 1. Cholelithiasis with gallbladder wall thickening and edema consistent with acute cholecystitis. Stone demonstrated in the distal common bile duct with intra and extrahepatic bile duct dilatation. 2. Simple appearing cyst in the left lobe of liver. This was present on the previous ultrasound and likely benign. Electronically Signed   By: Burman Nieves M.D.   On: 07/14/2020 03:20   DG Chest Portable 1 View  Result Date: 07/14/2020 CLINICAL DATA:  Dyspnea, vomiting EXAM: PORTABLE CHEST 1 VIEW COMPARISON:  05/15/2011 FINDINGS: The heart size and mediastinal contours are within normal limits. Both lungs are clear. The visualized skeletal structures are unremarkable. IMPRESSION: No active disease. Electronically Signed   By: Helyn Numbers MD   On: 07/14/2020 01:36        Scheduled Meds: . [MAR Hold] docusate sodium  100 mg Oral BID  . [MAR Hold] metoprolol succinate  50 mg Oral Daily   Continuous Infusions: . 0.9 % NaCl with KCl 20 mEq / L 100 mL/hr at 07/15/20 0911  . [MAR Hold] piperacillin-tazobactam (ZOSYN)  IV 3.375 g (07/15/20 1008)     LOS: 0 days       Lacretia Nicks, MD [facility]   To contact the attending provider between 7A-7P or the covering provider during after hours 7P-7A, please log into the web site [email or web address] and access using universal [place] password for that web site. If you do not have the password, please call the hospital  operator.  07/15/2020, 3:08 PM

## 2020-07-16 LAB — CBC
HCT: 40.4 % (ref 36.0–46.0)
Hemoglobin: 13.5 g/dL (ref 12.0–15.0)
MCH: 32.2 pg (ref 26.0–34.0)
MCHC: 33.4 g/dL (ref 30.0–36.0)
MCV: 96.4 fL (ref 80.0–100.0)
Platelets: 366 10*3/uL (ref 150–400)
RBC: 4.19 MIL/uL (ref 3.87–5.11)
RDW: 12.1 % (ref 11.5–15.5)
WBC: 9 10*3/uL (ref 4.0–10.5)
nRBC: 0 % (ref 0.0–0.2)

## 2020-07-16 LAB — COMPREHENSIVE METABOLIC PANEL
ALT: 498 U/L — ABNORMAL HIGH (ref 0–44)
AST: 299 U/L — ABNORMAL HIGH (ref 15–41)
Albumin: 3.2 g/dL — ABNORMAL LOW (ref 3.5–5.0)
Alkaline Phosphatase: 129 U/L — ABNORMAL HIGH (ref 38–126)
Anion gap: 10 (ref 5–15)
BUN: 7 mg/dL (ref 6–20)
CO2: 23 mmol/L (ref 22–32)
Calcium: 9.4 mg/dL (ref 8.9–10.3)
Chloride: 107 mmol/L (ref 98–111)
Creatinine, Ser: 1 mg/dL (ref 0.44–1.00)
GFR, Estimated: >60 mL/min (ref >60)
Glucose, Bld: 111 mg/dL — ABNORMAL HIGH (ref 70–99)
Potassium: 3.9 mmol/L (ref 3.5–5.1)
Sodium: 140 mmol/L (ref 135–145)
Total Bilirubin: 7.8 mg/dL — ABNORMAL HIGH (ref 0.3–1.2)
Total Protein: 7.5 g/dL (ref 6.5–8.1)

## 2020-07-16 MED ORDER — ENOXAPARIN SODIUM 80 MG/0.8ML ~~LOC~~ SOLN
70.0000 mg | Freq: Every day | SUBCUTANEOUS | Status: AC
  Administered 2020-07-16 – 2020-07-17 (×2): 70 mg via SUBCUTANEOUS
  Filled 2020-07-16 (×2): qty 0.8

## 2020-07-16 MED ORDER — LIP MEDEX EX OINT
TOPICAL_OINTMENT | CUTANEOUS | Status: AC
  Filled 2020-07-16: qty 7

## 2020-07-16 NOTE — Progress Notes (Signed)
Subjective: No complaints after the attempted ERCP yesterday.  Objective: Vital signs in last 24 hours: Temp:  [98 F (36.7 C)-99 F (37.2 C)] 98 F (36.7 C) (01/29 1327) Pulse Rate:  [65-103] 94 (01/29 1327) Resp:  [16-19] 18 (01/29 1327) BP: (144-166)/(97-114) 153/97 (01/29 1327) SpO2:  [94 %-100 %] 100 % (01/29 1327) Last BM Date: 07/15/20  Intake/Output from previous day: 01/28 0701 - 01/29 0700 In: 2243.7 [I.V.:2102.6; IV Piggyback:141.2] Out: -  Intake/Output this shift: Total I/O In: 480 [P.O.:480] Out: -   General appearance: alert and no distress GI: morbidly obese, mild generalized tenderness with palpation.  Lab Results: Recent Labs    07/14/20 0101 07/15/20 0401 07/16/20 0358  WBC 12.6* 8.6 9.0  HGB 15.2* 13.1 13.5  HCT 44.4 39.8 40.4  PLT 323 307 366   BMET Recent Labs    07/14/20 0101 07/15/20 0401 07/16/20 0358  NA 138 140 140  K 3.7 3.6 3.9  CL 106 108 107  CO2 19* 22 23  GLUCOSE 125* 98 111*  BUN 7 <5* 7  CREATININE 0.72 0.87 1.00  CALCIUM 9.4 9.0 9.4   LFT Recent Labs    07/16/20 0358  PROT 7.5  ALBUMIN 3.2*  AST 299*  ALT 498*  ALKPHOS 129*  BILITOT 7.8*   PT/INR Recent Labs    07/14/20 1015  LABPROT 13.3  INR 1.1   Hepatitis Panel No results for input(s): HEPBSAG, HCVAB, HEPAIGM, HEPBIGM in the last 72 hours. C-Diff No results for input(s): CDIFFTOX in the last 72 hours. Fecal Lactopherrin No results for input(s): FECLLACTOFRN in the last 72 hours.  Studies/Results: DG ERCP With Sphincterotomy  Result Date: 07/15/2020 CLINICAL DATA:  Attempted ERCP for biliary stones. EXAM: ERCP TECHNIQUE: Multiple spot images obtained with the fluoroscopic device and submitted for interpretation post-procedure. FLUOROSCOPY TIME:  Fluoroscopy Time:  14 minutes and 43 seconds Radiation Exposure Index (if provided by the fluoroscopic device): 456.17 mGy Number of Acquired Spot Images: 2 COMPARISON:  CT dated July 14, 2020 FINDINGS:  Two images were provided. There appears to be attempted cannulation of the common bile duct. No contrast is visualized. IMPRESSION: Attempted ERCP. These images were submitted for radiologic interpretation only. Please see the procedural report for the amount of contrast and the fluoroscopy time utilized. Electronically Signed   By: Katherine Mantle M.D.   On: 07/15/2020 18:20    Medications:  Scheduled: . docusate sodium  100 mg Oral BID  . enoxaparin (LOVENOX) injection  70 mg Subcutaneous Daily  . metoprolol succinate  50 mg Oral Daily   Continuous: . 0.9 % NaCl with KCl 20 mEq / L 100 mL/hr at 07/16/20 1053  . piperacillin-tazobactam (ZOSYN)  IV 3.375 g (07/16/20 1043)    Assessment/Plan: 1) Cholangitis from choledocholithiasis - stable.  Afebrile. 2) Abnormal liver enzymes. 3) Cholelithiasis.   She remains stable.  If there is any decompensation and ERCP will be performed this weekend.  Otherwise plan on a repeat ERCP with Dr. Meridee Score on Monday.  Plan: 1) ERCP on Monday. 2) Continue with Zosyn.  LOS: 1 day   Vu Liebman D 07/16/2020, 3:37 PM

## 2020-07-16 NOTE — Progress Notes (Signed)
ANTICOAGULATION CONSULT NOTE - Initial Consult  Pharmacy Consult for Lovenox Indication: VTE prophylaxis  Allergies  Allergen Reactions  . Flagyl [Metronidazole Hcl]     Chalky and coated tongue  . Metformin And Related Other (See Comments)    Nausea, diarrhea  . Spironolactone     Increased menstrual bleeding.    Patient Measurements: Height: 5\' 4"  (162.6 cm) Weight: (!) 138.3 kg (305 lb) IBW/kg (Calculated) : 54.7  Vital Signs: Temp: 98 F (36.7 C) (01/29 0420) Temp Source: Oral (01/29 0420) BP: 148/98 (01/29 0420) Pulse Rate: 65 (01/29 0420)  Labs: Recent Labs    07/14/20 0101 07/14/20 1015 07/15/20 0401 07/16/20 0358  HGB 15.2*  --  13.1 13.5  HCT 44.4  --  39.8 40.4  PLT 323  --  307 366  LABPROT  --  13.3  --   --   INR  --  1.1  --   --   CREATININE 0.72  --  0.87 1.00    Estimated Creatinine Clearance: 103 mL/min (by C-G formula based on SCr of 1 mg/dL).   Medical History: Past Medical History:  Diagnosis Date  . B12 deficiency 04/09/2016  . Elevated blood pressure reading without diagnosis of hypertension   . History of chicken pox   . Hypertension   . Mild hyperlipidemia 03/18/2012  . OSA (obstructive sleep apnea) 03/25/2012   Severe OSA per sleep study 10/13.   11/13 Palpitations   . PCOS (polycystic ovarian syndrome)     Medications:  No PTA anticoagulation  Assessment: Pharmacy consulted to dose enoxaparin (Lovenox) for VTE prophylaxis.  BMI > 30 and estimated creatinine clearance >30 ml/min.  Goal of Therapy:  Anti-Xa level 0.6-1 units/ml 4hrs after LMWH dose given Monitor platelets by anticoagulation protocol: Yes   Plan:  Lovenox 70 mg (0.5 mg/kg using total body weight) subq every 24 hours Hold Lovenox the morning of scheduled procedure on Monday 07/18/2020 and follow up resuming Lovenox after procedure Monitor CBC, s/s bleeding   07/20/2020, PharmD, BCPS 07/16/2020,8:41 AM

## 2020-07-16 NOTE — Progress Notes (Signed)
1 Day Post-Op   Subjective/Chief Complaint: No complaints today except some occasional RUQ tenderness Tolerating full liquids without n/v Unsuccessful cannulation at ERCP - GI is planning another attempt on Monday   Objective: Vital signs in last 24 hours: Temp:  [97.5 F (36.4 C)-99 F (37.2 C)] 98 F (36.7 C) (01/29 0420) Pulse Rate:  [65-110] 65 (01/29 0420) Resp:  [10-19] 16 (01/29 0420) BP: (144-166)/(96-119) 148/98 (01/29 0420) SpO2:  [94 %-100 %] 97 % (01/29 0420) Last BM Date: 07/15/20  Intake/Output from previous day: 01/28 0701 - 01/29 0700 In: 2243.7 [I.V.:2102.6; IV Piggyback:141.2] Out: -  Intake/Output this shift: No intake/output data recorded.  Gen:  Alert, NAD, pleasant HEENT: EOM's intact, pupils equal and round Card:  RRR Pulm:  CTAB, no W/R/R, effort normal Abd: Soft, obese, epigastric and RUQ tenderness, +BS Ext:  Calves equal in size b/l Psych: A&Ox4 Skin: no rashes noted, warm and dry  Lab Results:  Recent Labs    07/15/20 0401 07/16/20 0358  WBC 8.6 9.0  HGB 13.1 13.5  HCT 39.8 40.4  PLT 307 366   BMET Recent Labs    07/15/20 0401 07/16/20 0358  NA 140 140  K 3.6 3.9  CL 108 107  CO2 22 23  GLUCOSE 98 111*  BUN <5* 7  CREATININE 0.87 1.00  CALCIUM 9.0 9.4   Hepatic Function Latest Ref Rng & Units 07/16/2020 07/15/2020 07/14/2020  Total Protein 6.5 - 8.1 g/dL 7.5 7.1 8.3(H)  Albumin 3.5 - 5.0 g/dL 3.2(L) 3.0(L) 3.7  AST 15 - 41 U/L 299(H) 277(H) 261(H)  ALT 0 - 44 U/L 498(H) 424(H) 322(H)  Alk Phosphatase 38 - 126 U/L 129(H) 121 137(H)  Total Bilirubin 0.3 - 1.2 mg/dL 7.8(H) 7.8(H) 8.7(H)  Bilirubin, Direct 0.0 - 0.3 mg/dL - - -    PT/INR Recent Labs    07/14/20 1015  LABPROT 13.3  INR 1.1   ABG No results for input(s): PHART, HCO3 in the last 72 hours.  Invalid input(s): PCO2, PO2  Studies/Results: DG ERCP With Sphincterotomy  Result Date: 07/15/2020 CLINICAL DATA:  Attempted ERCP for biliary stones. EXAM: ERCP  TECHNIQUE: Multiple spot images obtained with the fluoroscopic device and submitted for interpretation post-procedure. FLUOROSCOPY TIME:  Fluoroscopy Time:  14 minutes and 43 seconds Radiation Exposure Index (if provided by the fluoroscopic device): 456.17 mGy Number of Acquired Spot Images: 2 COMPARISON:  CT dated July 14, 2020 FINDINGS: Two images were provided. There appears to be attempted cannulation of the common bile duct. No contrast is visualized. IMPRESSION: Attempted ERCP. These images were submitted for radiologic interpretation only. Please see the procedural report for the amount of contrast and the fluoroscopy time utilized. Electronically Signed   By: Constance Holster M.D.   On: 07/15/2020 18:20    Anti-infectives: Anti-infectives (From admission, onward)   Start     Dose/Rate Route Frequency Ordered Stop   07/14/20 1000  piperacillin-tazobactam (ZOSYN) IVPB 3.375 g        3.375 g 12.5 mL/hr over 240 Minutes Intravenous Every 8 hours 07/14/20 0907     07/14/20 0400  piperacillin-tazobactam (ZOSYN) IVPB 3.375 g        3.375 g 100 mL/hr over 30 Minutes Intravenous  Once 07/14/20 0351 07/14/20 0451      Assessment/Plan: Hx tachycardia- type unknown. Appreciate TRH assistance. HR reg this AM.  Hypertension - restart home meds  OSA Polycystic ovarian syndrome Covid positive- 06/27/20 - discussed with TRH, off precautions.  BMI 52.3  Acute Cholecystitis Choledocholithiasis w/ elevated LFT's  - ERCP Monday with GI - Plan for Lap Chole after ERCP - Cont IV abx - Pulm toilet, mobilize   FEN: IVF, FLD.  NPO p MN on Sunday for ERCP Monday  ID: Zosyn DVT: SCDs, restart Lovenox today - will hold for ERCP Monday    LOS: 1 day    Tina Figueroa 07/16/2020

## 2020-07-17 NOTE — Progress Notes (Signed)
2 Days Post-Op   Subjective/Chief Complaint: No complaints BM yesterday No new labs   Objective: Vital signs in last 24 hours: Temp:  [97.9 F (36.6 C)-98.7 F (37.1 C)] 98.7 F (37.1 C) (01/30 0429) Pulse Rate:  [69-94] 69 (01/30 0429) Resp:  [16-20] 16 (01/30 0429) BP: (153-154)/(97-109) 154/99 (01/30 0429) SpO2:  [98 %-100 %] 99 % (01/30 0429) Last BM Date: 07/16/20  Intake/Output from previous day: 01/29 0701 - 01/30 0700 In: 1180 [P.O.:480; I.V.:700] Out: -  Intake/Output this shift: No intake/output data recorded.  Gen: Alert, NAD, pleasant HEENT: EOM's intact, pupils equal and round Card: RRR Pulm: CTAB, no W/R/R, effort normal Abd: Soft,obese, minimal RUQ tenderness, +BS QAS:TMHDQQ equal in size b/l Psych: A&Ox4 Skin: no rashes noted, warm and dry  Lab Results:  Recent Labs    07/15/20 0401 07/16/20 0358  WBC 8.6 9.0  HGB 13.1 13.5  HCT 39.8 40.4  PLT 307 366   BMET Recent Labs    07/15/20 0401 07/16/20 0358  NA 140 140  K 3.6 3.9  CL 108 107  CO2 22 23  GLUCOSE 98 111*  BUN <5* 7  CREATININE 0.87 1.00  CALCIUM 9.0 9.4   PT/INR Recent Labs    07/14/20 1015  LABPROT 13.3  INR 1.1   ABG No results for input(s): PHART, HCO3 in the last 72 hours.  Invalid input(s): PCO2, PO2  Studies/Results: DG ERCP With Sphincterotomy  Result Date: 07/15/2020 CLINICAL DATA:  Attempted ERCP for biliary stones. EXAM: ERCP TECHNIQUE: Multiple spot images obtained with the fluoroscopic device and submitted for interpretation post-procedure. FLUOROSCOPY TIME:  Fluoroscopy Time:  14 minutes and 43 seconds Radiation Exposure Index (if provided by the fluoroscopic device): 456.17 mGy Number of Acquired Spot Images: 2 COMPARISON:  CT dated July 14, 2020 FINDINGS: Two images were provided. There appears to be attempted cannulation of the common bile duct. No contrast is visualized. IMPRESSION: Attempted ERCP. These images were submitted for radiologic  interpretation only. Please see the procedural report for the amount of contrast and the fluoroscopy time utilized. Electronically Signed   By: Katherine Mantle M.D.   On: 07/15/2020 18:20    Anti-infectives: Anti-infectives (From admission, onward)   Start     Dose/Rate Route Frequency Ordered Stop   07/14/20 1000  piperacillin-tazobactam (ZOSYN) IVPB 3.375 g        3.375 g 12.5 mL/hr over 240 Minutes Intravenous Every 8 hours 07/14/20 0907     07/14/20 0400  piperacillin-tazobactam (ZOSYN) IVPB 3.375 g        3.375 g 100 mL/hr over 30 Minutes Intravenous  Once 07/14/20 0351 07/14/20 0451      Assessment/Plan: Hx tachycardia- type unknown. Appreciate TRH assistance. HR reg this AM. Hypertension- restart home meds OSA Polycystic ovarian syndrome Covid positive-06/27/20- discussed with TRH, off precautions. BMI 52.3  AcuteCholecystitis Choledocholithiasisw/ elevated LFT's  - ERCP Monday with GI - Plan for Lap Chole after ERCP - Cont IV abx - Pulm toilet, mobilize  FEN:IVF,FLD.  NPO p MN on Sunday for ERCP Monday ID: Zosyn IWL:NLGX, restart Lovenox today - will hold for ERCP Monday   LOS: 2 days    Tina Figueroa 07/17/2020

## 2020-07-17 NOTE — H&P (View-Only) (Signed)
Subjective: More right sided pain this AM, but she feels that this was caused by her sleeping on that side.  Objective: Vital signs in last 24 hours: Temp:  [97.9 F (36.6 C)-98.7 F (37.1 C)] 98.7 F (37.1 C) (01/30 0429) Pulse Rate:  [69-94] 69 (01/30 0429) Resp:  [16-20] 16 (01/30 0429) BP: (153-154)/(97-109) 154/99 (01/30 0429) SpO2:  [98 %-100 %] 99 % (01/30 0429) Last BM Date: 07/16/20  Intake/Output from previous day: 01/29 0701 - 01/30 0700 In: 1180 [P.O.:480; I.V.:700] Out: -  Intake/Output this shift: Total I/O In: 240 [P.O.:240] Out: -   General appearance: alert and no distress GI: tender on the right side, mild  Lab Results: Recent Labs    07/15/20 0401 07/16/20 0358  WBC 8.6 9.0  HGB 13.1 13.5  HCT 39.8 40.4  PLT 307 366   BMET Recent Labs    07/15/20 0401 07/16/20 0358  NA 140 140  K 3.6 3.9  CL 108 107  CO2 22 23  GLUCOSE 98 111*  BUN <5* 7  CREATININE 0.87 1.00  CALCIUM 9.0 9.4   LFT Recent Labs    07/16/20 0358  PROT 7.5  ALBUMIN 3.2*  AST 299*  ALT 498*  ALKPHOS 129*  BILITOT 7.8*   PT/INR Recent Labs    07/14/20 1015  LABPROT 13.3  INR 1.1   Hepatitis Panel No results for input(s): HEPBSAG, HCVAB, HEPAIGM, HEPBIGM in the last 72 hours. C-Diff No results for input(s): CDIFFTOX in the last 72 hours. Fecal Lactopherrin No results for input(s): FECLLACTOFRN in the last 72 hours.  Studies/Results: DG ERCP With Sphincterotomy  Result Date: 07/15/2020 CLINICAL DATA:  Attempted ERCP for biliary stones. EXAM: ERCP TECHNIQUE: Multiple spot images obtained with the fluoroscopic device and submitted for interpretation post-procedure. FLUOROSCOPY TIME:  Fluoroscopy Time:  14 minutes and 43 seconds Radiation Exposure Index (if provided by the fluoroscopic device): 456.17 mGy Number of Acquired Spot Images: 2 COMPARISON:  CT dated July 14, 2020 FINDINGS: Two images were provided. There appears to be attempted cannulation of the  common bile duct. No contrast is visualized. IMPRESSION: Attempted ERCP. These images were submitted for radiologic interpretation only. Please see the procedural report for the amount of contrast and the fluoroscopy time utilized. Electronically Signed   By: Katherine Mantle M.D.   On: 07/15/2020 18:20    Medications:  Scheduled: . docusate sodium  100 mg Oral BID  . enoxaparin (LOVENOX) injection  70 mg Subcutaneous Daily  . metoprolol succinate  50 mg Oral Daily   Continuous: . 0.9 % NaCl with KCl 20 mEq / L 100 mL/hr at 07/17/20 0837  . piperacillin-tazobactam (ZOSYN)  IV 3.375 g (07/17/20 0146)    Assessment/Plan: 1) Ascending cholangitis. 2) Choledocholithiasis. 3) Cholelithiasis.   She remains afebrile on Zosyn.  She is schedule for a repeat ERCP with Dr. Meridee Score in the AM tomorrow.  LOS: 2 days   Tina Figueroa D 07/17/2020, 9:16 AM

## 2020-07-17 NOTE — Progress Notes (Signed)
Subjective: More right sided pain this AM, but she feels that this was caused by her sleeping on that side.  Objective: Vital signs in last 24 hours: Temp:  [97.9 F (36.6 C)-98.7 F (37.1 C)] 98.7 F (37.1 C) (01/30 0429) Pulse Rate:  [69-94] 69 (01/30 0429) Resp:  [16-20] 16 (01/30 0429) BP: (153-154)/(97-109) 154/99 (01/30 0429) SpO2:  [98 %-100 %] 99 % (01/30 0429) Last BM Date: 07/16/20  Intake/Output from previous day: 01/29 0701 - 01/30 0700 In: 1180 [P.O.:480; I.V.:700] Out: -  Intake/Output this shift: Total I/O In: 240 [P.O.:240] Out: -   General appearance: alert and no distress GI: tender on the right side, mild  Lab Results: Recent Labs    07/15/20 0401 07/16/20 0358  WBC 8.6 9.0  HGB 13.1 13.5  HCT 39.8 40.4  PLT 307 366   BMET Recent Labs    07/15/20 0401 07/16/20 0358  NA 140 140  K 3.6 3.9  CL 108 107  CO2 22 23  GLUCOSE 98 111*  BUN <5* 7  CREATININE 0.87 1.00  CALCIUM 9.0 9.4   LFT Recent Labs    07/16/20 0358  PROT 7.5  ALBUMIN 3.2*  AST 299*  ALT 498*  ALKPHOS 129*  BILITOT 7.8*   PT/INR Recent Labs    07/14/20 1015  LABPROT 13.3  INR 1.1   Hepatitis Panel No results for input(s): HEPBSAG, HCVAB, HEPAIGM, HEPBIGM in the last 72 hours. C-Diff No results for input(s): CDIFFTOX in the last 72 hours. Fecal Lactopherrin No results for input(s): FECLLACTOFRN in the last 72 hours.  Studies/Results: DG ERCP With Sphincterotomy  Result Date: 07/15/2020 CLINICAL DATA:  Attempted ERCP for biliary stones. EXAM: ERCP TECHNIQUE: Multiple spot images obtained with the fluoroscopic device and submitted for interpretation post-procedure. FLUOROSCOPY TIME:  Fluoroscopy Time:  14 minutes and 43 seconds Radiation Exposure Index (if provided by the fluoroscopic device): 456.17 mGy Number of Acquired Spot Images: 2 COMPARISON:  CT dated July 14, 2020 FINDINGS: Two images were provided. There appears to be attempted cannulation of the  common bile duct. No contrast is visualized. IMPRESSION: Attempted ERCP. These images were submitted for radiologic interpretation only. Please see the procedural report for the amount of contrast and the fluoroscopy time utilized. Electronically Signed   By: Christopher  Green M.D.   On: 07/15/2020 18:20    Medications:  Scheduled: . docusate sodium  100 mg Oral BID  . enoxaparin (LOVENOX) injection  70 mg Subcutaneous Daily  . metoprolol succinate  50 mg Oral Daily   Continuous: . 0.9 % NaCl with KCl 20 mEq / L 100 mL/hr at 07/17/20 0837  . piperacillin-tazobactam (ZOSYN)  IV 3.375 g (07/17/20 0146)    Assessment/Plan: 1) Ascending cholangitis. 2) Choledocholithiasis. 3) Cholelithiasis.   She remains afebrile on Zosyn.  She is schedule for a repeat ERCP with Dr. Mansouraty in the AM tomorrow.  LOS: 2 days   Michal Callicott D 07/17/2020, 9:16 AM 

## 2020-07-17 NOTE — Progress Notes (Signed)
FYI--Pt stating she will need her BP controlled in the morning, as she will not be able to take her Toprol via pill form.

## 2020-07-18 ENCOUNTER — Inpatient Hospital Stay (HOSPITAL_COMMUNITY): Payer: PRIVATE HEALTH INSURANCE | Admitting: Anesthesiology

## 2020-07-18 ENCOUNTER — Encounter (HOSPITAL_COMMUNITY): Payer: Self-pay

## 2020-07-18 ENCOUNTER — Inpatient Hospital Stay (HOSPITAL_COMMUNITY): Payer: PRIVATE HEALTH INSURANCE

## 2020-07-18 ENCOUNTER — Encounter (HOSPITAL_COMMUNITY): Admission: EM | Disposition: A | Discharge status: Home / Self Care | Payer: Self-pay | Source: Home / Self Care

## 2020-07-18 DIAGNOSIS — K2970 Gastritis, unspecified, without bleeding: Secondary | ICD-10-CM

## 2020-07-18 DIAGNOSIS — K8051 Calculus of bile duct without cholangitis or cholecystitis with obstruction: Secondary | ICD-10-CM

## 2020-07-18 HISTORY — PX: ERCP: SHX5425

## 2020-07-18 HISTORY — PX: REMOVAL OF STONES: SHX5545

## 2020-07-18 HISTORY — PX: SPHINCTEROTOMY: SHX5544

## 2020-07-18 HISTORY — PX: BIOPSY: SHX5522

## 2020-07-18 SURGERY — ERCP, WITH INTERVENTION IF INDICATED
Anesthesia: General

## 2020-07-18 MED ORDER — SUCCINYLCHOLINE CHLORIDE 200 MG/10ML IV SOSY
PREFILLED_SYRINGE | INTRAVENOUS | Status: DC | PRN
  Administered 2020-07-18: 140 mg via INTRAVENOUS

## 2020-07-18 MED ORDER — SODIUM CHLORIDE 0.9 % IV SOLN
INTRAVENOUS | Status: DC | PRN
  Administered 2020-07-18: 40 mL

## 2020-07-18 MED ORDER — SODIUM CHLORIDE 0.9 % IV SOLN: INTRAVENOUS | Status: DC

## 2020-07-18 MED ORDER — PROPOFOL 10 MG/ML IV BOLUS
INTRAVENOUS | Status: AC
  Filled 2020-07-18: qty 40

## 2020-07-18 MED ORDER — LABETALOL HCL 5 MG/ML IV SOLN
INTRAVENOUS | Status: AC
  Filled 2020-07-18: qty 4

## 2020-07-18 MED ORDER — FENTANYL CITRATE (PF) 100 MCG/2ML IJ SOLN
INTRAMUSCULAR | Status: AC
  Filled 2020-07-18: qty 2

## 2020-07-18 MED ORDER — INDOMETHACIN 50 MG RE SUPP
RECTAL | Status: AC
  Filled 2020-07-18: qty 2

## 2020-07-18 MED ORDER — LABETALOL HCL 5 MG/ML IV SOLN
5.0000 mg | INTRAVENOUS | Status: DC | PRN
  Administered 2020-07-18 (×3): 5 mg via INTRAVENOUS

## 2020-07-18 MED ORDER — INDOMETHACIN 50 MG RE SUPP
RECTAL | Status: DC | PRN
  Administered 2020-07-18: 100 mg via RECTAL

## 2020-07-18 MED ORDER — DEXAMETHASONE SODIUM PHOSPHATE 10 MG/ML IJ SOLN
INTRAMUSCULAR | Status: DC | PRN
  Administered 2020-07-18: 10 mg via INTRAVENOUS

## 2020-07-18 MED ORDER — GLUCAGON HCL RDNA (DIAGNOSTIC) 1 MG IJ SOLR
INTRAMUSCULAR | Status: DC | PRN
  Administered 2020-07-18 (×3): .25 mg via INTRAVENOUS

## 2020-07-18 MED ORDER — LACTATED RINGERS IV SOLN: INTRAVENOUS | Status: DC

## 2020-07-18 MED ORDER — ROCURONIUM BROMIDE 10 MG/ML (PF) SYRINGE
PREFILLED_SYRINGE | INTRAVENOUS | Status: DC | PRN
  Administered 2020-07-18: 20 mg via INTRAVENOUS
  Administered 2020-07-18: 10 mg via INTRAVENOUS
  Administered 2020-07-18: 50 mg via INTRAVENOUS

## 2020-07-18 MED ORDER — SUGAMMADEX SODIUM 500 MG/5ML IV SOLN
INTRAVENOUS | Status: DC | PRN
  Administered 2020-07-18: 300 mg via INTRAVENOUS

## 2020-07-18 MED ORDER — MIDAZOLAM HCL 2 MG/2ML IJ SOLN
INTRAMUSCULAR | Status: AC
  Filled 2020-07-18: qty 2

## 2020-07-18 MED ORDER — PROPOFOL 10 MG/ML IV BOLUS
INTRAVENOUS | Status: DC | PRN
  Administered 2020-07-18: 200 mg via INTRAVENOUS

## 2020-07-18 MED ORDER — LIDOCAINE 2% (20 MG/ML) 5 ML SYRINGE
INTRAMUSCULAR | Status: DC | PRN
  Administered 2020-07-18: 100 mg via INTRAVENOUS

## 2020-07-18 MED ORDER — FENTANYL CITRATE (PF) 100 MCG/2ML IJ SOLN
INTRAMUSCULAR | Status: DC | PRN
  Administered 2020-07-18 (×4): 50 ug via INTRAVENOUS

## 2020-07-18 MED ORDER — ONDANSETRON HCL 4 MG/2ML IJ SOLN
INTRAMUSCULAR | Status: DC | PRN
  Administered 2020-07-18: 4 mg via INTRAVENOUS

## 2020-07-18 MED ORDER — GLUCAGON HCL RDNA (DIAGNOSTIC) 1 MG IJ SOLR
INTRAMUSCULAR | Status: AC
  Filled 2020-07-18: qty 1

## 2020-07-18 MED ORDER — MIDAZOLAM HCL 5 MG/5ML IJ SOLN
INTRAMUSCULAR | Status: DC | PRN
  Administered 2020-07-18: 2 mg via INTRAVENOUS

## 2020-07-18 MED ORDER — METOPROLOL TARTRATE 5 MG/5ML IV SOLN
10.0000 mg | Freq: Once | INTRAVENOUS | Status: AC
  Administered 2020-07-18: 10 mg via INTRAVENOUS
  Filled 2020-07-18: qty 10

## 2020-07-18 NOTE — H&P (View-Only) (Signed)
[facility] Progress Note  3 Days Post-Op  Subjective: CC-  Continues to have some mild RUQ discomfort. Tolerated clear liquids yesterday. Denies n/v. Multiple BM yesterday. Going for repeat ERCP today.  Objective: Vital signs in last 24 hours: Temp:  [98 F (36.7 C)-98.1 F (36.7 C)] 98.1 F (36.7 C) (01/31 0420) Pulse Rate:  [58-86] 78 (01/31 0420) Resp:  [16-17] 17 (01/31 0420) BP: (140-150)/(101-108) 140/108 (01/31 0420) SpO2:  [96 %-99 %] 96 % (01/31 0420) Last BM Date: 07/17/20  Intake/Output from previous day: 01/30 0701 - 01/31 0700 In: 2954.4 [P.O.:1080; I.V.:1824.4; IV Piggyback:50] Out: -  Intake/Output this shift: No intake/output data recorded.  PE: Gen:  Alert, NAD, pleasant Pulm:  rate and effort normal Abd: obese, soft, minimal RUQ TTP Psych: A&Ox4  Skin: no rashes noted, warm and dry  Lab Results:  Recent Labs    07/16/20 0358  WBC 9.0  HGB 13.5  HCT 40.4  PLT 366   BMET Recent Labs    07/16/20 0358  NA 140  K 3.9  CL 107  CO2 23  GLUCOSE 111*  BUN 7  CREATININE 1.00  CALCIUM 9.4   PT/INR No results for input(s): LABPROT, INR in the last 72 hours. CMP     Component Value Date/Time   NA 140 07/16/2020 0358   K 3.9 07/16/2020 0358   CL 107 07/16/2020 0358   CO2 23 07/16/2020 0358   GLUCOSE 111 (H) 07/16/2020 0358   BUN 7 07/16/2020 0358   CREATININE 1.00 07/16/2020 0358   CREATININE 0.74 12/17/2013 0938   CALCIUM 9.4 07/16/2020 0358   PROT 7.5 07/16/2020 0358   ALBUMIN 3.2 (L) 07/16/2020 0358   AST 299 (H) 07/16/2020 0358   ALT 498 (H) 07/16/2020 0358   ALKPHOS 129 (H) 07/16/2020 0358   BILITOT 7.8 (H) 07/16/2020 0358   GFRNONAA >60 07/16/2020 0358   GFRNONAA >89 03/09/2013 1213   GFRAA >89 03/09/2013 1213   Lipase     Component Value Date/Time   LIPASE 26 07/15/2020 0401       Studies/Results: No results found.  Anti-infectives: Anti-infectives (From admission, onward)   Start     Dose/Rate  Route Frequency Ordered Stop   07/14/20 1000  piperacillin-tazobactam (ZOSYN) IVPB 3.375 g        3.375 g 12.5 mL/hr over 240 Minutes Intravenous Every 8 hours 07/14/20 0907     07/14/20 0400  piperacillin-tazobactam (ZOSYN) IVPB 3.375 g        3.375 g 100 mL/hr over 30 Minutes Intravenous  Once 07/14/20 0351 07/14/20 0451       Assessment/Plan Hx sinus tachycardia- on Toprol Hypertension OSA Polycystic ovarian syndrome Covid positive-06/27/20- discussed with TRH, off precautions. BMI 52.3  AcuteCholecystitis Choledocholithiasisw/ elevated LFT's  - failed ERCP 1/28 - Scheduled for repeat ERCPtoday. If successful and no complications will plan for lap chole tomorrow. Repeat labs in AM. Continue IV antibiotics.  ID - zosyn 1/27>> FEN - IVF, NPO VTE - SCDs, lovenox held for proceudre Foley - none Follow up - TBD   LOS: 3 days    Franne Forts, [facility] 07/18/2020, 8:51 AM Please see Amion for pager number during day hours 7:00am-4:30pm

## 2020-07-18 NOTE — Progress Notes (Signed)
[facility] Progress Note  3 Days Post-Op  Subjective: CC-  Continues to have some mild RUQ discomfort. Tolerated clear liquids yesterday. Denies n/v. Multiple BM yesterday. Going for repeat ERCP today.  Objective: Vital signs in last 24 hours: Temp:  [98 F (36.7 C)-98.1 F (36.7 C)] 98.1 F (36.7 C) (01/31 0420) Pulse Rate:  [58-86] 78 (01/31 0420) Resp:  [16-17] 17 (01/31 0420) BP: (140-150)/(101-108) 140/108 (01/31 0420) SpO2:  [96 %-99 %] 96 % (01/31 0420) Last BM Date: 07/17/20  Intake/Output from previous day: 01/30 0701 - 01/31 0700 In: 2954.4 [P.O.:1080; I.V.:1824.4; IV Piggyback:50] Out: -  Intake/Output this shift: No intake/output data recorded.  PE: Gen:  Alert, NAD, pleasant Pulm:  rate and effort normal Abd: obese, soft, minimal RUQ TTP Psych: A&Ox4  Skin: no rashes noted, warm and dry  Lab Results:  Recent Labs    07/16/20 0358  WBC 9.0  HGB 13.5  HCT 40.4  PLT 366   BMET Recent Labs    07/16/20 0358  NA 140  K 3.9  CL 107  CO2 23  GLUCOSE 111*  BUN 7  CREATININE 1.00  CALCIUM 9.4   PT/INR No results for input(s): LABPROT, INR in the last 72 hours. CMP     Component Value Date/Time   NA 140 07/16/2020 0358   K 3.9 07/16/2020 0358   CL 107 07/16/2020 0358   CO2 23 07/16/2020 0358   GLUCOSE 111 (H) 07/16/2020 0358   BUN 7 07/16/2020 0358   CREATININE 1.00 07/16/2020 0358   CREATININE 0.74 12/17/2013 0938   CALCIUM 9.4 07/16/2020 0358   PROT 7.5 07/16/2020 0358   ALBUMIN 3.2 (L) 07/16/2020 0358   AST 299 (H) 07/16/2020 0358   ALT 498 (H) 07/16/2020 0358   ALKPHOS 129 (H) 07/16/2020 0358   BILITOT 7.8 (H) 07/16/2020 0358   GFRNONAA >60 07/16/2020 0358   GFRNONAA >89 03/09/2013 1213   GFRAA >89 03/09/2013 1213   Lipase     Component Value Date/Time   LIPASE 26 07/15/2020 0401       Studies/Results: No results found.  Anti-infectives: Anti-infectives (From admission, onward)   Start     Dose/Rate  Route Frequency Ordered Stop   07/14/20 1000  piperacillin-tazobactam (ZOSYN) IVPB 3.375 g        3.375 g 12.5 mL/hr over 240 Minutes Intravenous Every 8 hours 07/14/20 0907     07/14/20 0400  piperacillin-tazobactam (ZOSYN) IVPB 3.375 g        3.375 g 100 mL/hr over 30 Minutes Intravenous  Once 07/14/20 0351 07/14/20 0451       Assessment/Plan Hx sinus tachycardia- on Toprol Hypertension OSA Polycystic ovarian syndrome Covid positive-06/27/20- discussed with TRH, off precautions. BMI 52.3  AcuteCholecystitis Choledocholithiasisw/ elevated LFT's  - failed ERCP 1/28 - Scheduled for repeat ERCPtoday. If successful and no complications will plan for lap chole tomorrow. Repeat labs in AM. Continue IV antibiotics.  ID - zosyn 1/27>> FEN - IVF, NPO VTE - SCDs, lovenox held for proceudre Foley - none Follow up - TBD   LOS: 3 days    Shakeem Stern A Yovanny Coats, PA-C [facility] 07/18/2020, 8:51 AM Please see Amion for pager number during day hours 7:00am-4:30pm 

## 2020-07-18 NOTE — Anesthesia Procedure Notes (Signed)
Procedure Name: Intubation Date/Time: 07/18/2020 1:58 PM Performed by: Lavina Hamman, CRNA Pre-anesthesia Checklist: Patient identified, Emergency Drugs available, Suction available, Patient being monitored and Timeout performed Patient Re-evaluated:Patient Re-evaluated prior to induction Oxygen Delivery Method: Circle system utilized Preoxygenation: Pre-oxygenation with 100% oxygen Induction Type: IV induction Ventilation: Mask ventilation without difficulty Laryngoscope Size: Mac and 4 Grade View: Grade I Tube type: Oral Tube size: 7.0 mm Number of attempts: 1 Airway Equipment and Method: Stylet Placement Confirmation: ETT inserted through vocal cords under direct vision,  positive ETCO2,  CO2 detector and breath sounds checked- equal and bilateral Secured at: 23 cm Tube secured with: Tape Dental Injury: Teeth and Oropharynx as per pre-operative assessment

## 2020-07-18 NOTE — Transfer of Care (Signed)
Immediate Anesthesia Transfer of Care Note  Patient: Tina Figueroa  Procedure(s) Performed: ENDOSCOPIC RETROGRADE CHOLANGIOPANCREATOGRAPHY (ERCP) (N/A )  Patient Location: PACU and Endoscopy Unit  Anesthesia Type:General  Level of Consciousness: awake, alert  and patient cooperative  Airway & Oxygen Therapy: Patient Spontanous Breathing and Patient connected to face mask oxygen  Post-op Assessment: Report given to RN and Post -op Vital signs reviewed and stable  Post vital signs: Reviewed and stable  Last Vitals:  Vitals Value Taken Time  BP    Temp    Pulse 95 07/18/20 1542  Resp 21 07/18/20 1542  SpO2 100 % 07/18/20 1542  Vitals shown include unvalidated device data.  Last Pain:  Vitals:   07/18/20 1235  TempSrc: Oral  PainSc: 0-No pain      Patients Stated Pain Goal: 2 (07/17/20 1328)  Complications: No complications documented.

## 2020-07-18 NOTE — Progress Notes (Signed)
Pharmacy consulted to manage Lovenox VTE ppx around GI procedures  - LMWH held 1/30 PM - ERCP 1/31 - per GI, will have lap cholecystectomy tomorrow UNLESS develops pancreatitis following ERCP - in light of uncertainty around surgery tomorrow, will hold LMWH again tonight - SCDs ordered  Bernadene Person, PharmD, BCPS [phone] 07/18/2020, 3:37 PM

## 2020-07-18 NOTE — Interval H&P Note (Signed)
History and Physical Interval Note:  07/18/2020 1:18 PM  Tina Figueroa  has presented today for surgery, with the diagnosis of Choledocholithiasis and failed ERCP.  The various methods of treatment have been discussed with the patient and family. After consideration of risks, benefits and other options for treatment, the patient has consented to  Procedure(s): ENDOSCOPIC RETROGRADE CHOLANGIOPANCREATOGRAPHY (ERCP) (N/A) as a surgical intervention.  The patient's history has been reviewed, patient examined, no change in status, stable for surgery.  I have reviewed the patient's chart and labs.  Questions were answered to the patient's satisfaction.    The risks of an ERCP were discussed at length, including but not limited to the risk of perforation, bleeding, abdominal pain, post-ERCP pancreatitis (while usually mild can be severe and even life threatening).   [employer]

## 2020-07-18 NOTE — Anesthesia Postprocedure Evaluation (Signed)
Anesthesia Post Note  Patient: Tina Figueroa  Procedure(s) Performed: ENDOSCOPIC RETROGRADE CHOLANGIOPANCREATOGRAPHY (ERCP) (N/A ) BIOPSY SPHINCTEROTOMY REMOVAL OF STONES     Patient location during evaluation: PACU Anesthesia Type: General Level of consciousness: awake and alert Pain management: pain level controlled Vital Signs Assessment: post-procedure vital signs reviewed and stable Respiratory status: spontaneous breathing, nonlabored ventilation and respiratory function stable Cardiovascular status: blood pressure returned to baseline and stable Postop Assessment: no apparent nausea or vomiting Anesthetic complications: no   No complications documented.  Last Vitals:  Vitals:   07/18/20 1620 07/18/20 1630  BP: (!) 161/104 (!) 172/98  Pulse: 85 89  Resp: 17 16  Temp:    SpO2: 96% 96%    Last Pain:  Vitals:   07/18/20 1630  TempSrc:   PainSc: 0-No pain                 Duyen Beckom,W. EDMOND

## 2020-07-18 NOTE — Anesthesia Preprocedure Evaluation (Addendum)
Anesthesia Evaluation  Patient identified by MRN, date of birth, ID band Patient awake    Reviewed: Allergy & Precautions, H&P , NPO status , Patient's Chart, lab work & pertinent test results  Airway Mallampati: III  TM Distance: >3 FB Neck ROM: Full    Dental no notable dental hx. (+) Teeth Intact, Dental Advisory Given   Pulmonary sleep apnea ,    Pulmonary exam normal breath sounds clear to auscultation       Cardiovascular hypertension, Pt. on medications and Pt. on home beta blockers  Rhythm:Regular Rate:Normal     Neuro/Psych Depression negative neurological ROS     GI/Hepatic Neg liver ROS, GERD  ,  Endo/Other  Morbid obesity  Renal/GU negative Renal ROS  negative genitourinary   Musculoskeletal   Abdominal   Peds  Hematology negative hematology ROS (+)   Anesthesia Other Findings   Reproductive/Obstetrics negative OB ROS                            Anesthesia Physical Anesthesia Plan  ASA: III  Anesthesia Plan: General   Post-op Pain Management:    Induction: Intravenous  PONV Risk Score and Plan: 4 or greater and Ondansetron, Dexamethasone and Midazolam  Airway Management Planned: Oral ETT  Additional Equipment:   Intra-op Plan:   Post-operative Plan: Extubation in OR  Informed Consent: I have reviewed the patients History and Physical, chart, labs and discussed the procedure including the risks, benefits and alternatives for the proposed anesthesia with the patient or authorized representative who has indicated his/her understanding and acceptance.     Dental advisory given  Plan Discussed with: CRNA  Anesthesia Plan Comments:         Anesthesia Quick Evaluation

## 2020-07-18 NOTE — Op Note (Signed)
[facility] Patient Name: Tina Figueroa Procedure Date: 07/18/2020 MRN: [id number] Attending MD: Justice Britain , MD Date of Birth: 03/13/1981 CSN: [id number] Age: 40 Admit Type: Inpatient Procedure:                ERCP Indications:              Bile duct stone(s), Bile duct stone on Computed                            Tomogram Scan, Jaundice, Abnormal liver function                            test, Prior failed Endoscopic Retrograde                            Cholangiopancreatography Providers:                Justice Britain, MD, Cleda Daub, RN, Tyna Jaksch Technician Referring MD:             Docia Chuck. Henrene Pastor, MD, CCS Medicines:                General Anesthesia, Indomethacin 100 mg PR, Zosyn                            not administered as it was already given during                            day, Glucagon 1 mg IV Complications:            No immediate complications. Estimated Blood Loss:     Estimated blood loss was minimal. Procedure:                Pre-Anesthesia Assessment:                           - Prior to the procedure, a History and Physical                            was performed, and patient medications and                            allergies were reviewed. The patient's tolerance of                            previous anesthesia was also reviewed. The risks                            and benefits of the procedure and the sedation                            options and risks were discussed with the patient.  All questions were answered, and informed consent                            was obtained. Prior Anticoagulants: The patient has                            taken Lovenox (enoxaparin), last dose was 1 day                            prior to procedure. ASA Grade Assessment: III - A                            patient with severe systemic disease. After                            reviewing the  risks and benefits, the patient was                            deemed in satisfactory condition to undergo the                            procedure.                           After obtaining informed consent, the scope was                            passed under direct vision. Throughout the                            procedure, the patient's blood pressure, pulse, and                            oxygen saturations were monitored continuously. The                            ERCP [id number] was introduced through the mouth, and                            used to inject contrast into and used to inject                            contrast into the bile duct. The ERCP was                            technically difficult and complex due to                            challenging cannulation. Successful completion of                            the procedure was aided by performing the maneuvers  documented (below) in this report. The patient                            tolerated the procedure. Scope In: Scope Out: Findings:      The scout film was normal.      The upper GI tract was traversed under direct vision without detailed       examination. A J-shaped deformity of the stomach was found. Patchy mild       inflammation characterized by erythema and friability was found in the       gastric body and in the gastric antrum - this was biopsied for HP       evaluation. No gross lesions were noted in the duodenal bulb, in the       first portion of the duodenum and in the second portion of the duodenum.       The major papilla was bulging and edematous - query recent manipulation       vs impacted stone - minimal bile flow noted.      The bile duct could not be cannulated with the Revolution Jagtome       sphincterotome initially. I was able to seat within the duct but the       wire did not move appropriately. When I finally had some appropriate       movement of the wire  with the fluoroscopy imaging, I attempted further       movement of the wire but it only went in partially. An injection was       performed and this was noted to be a small intramural injection. Further       attempted in both the semi-long position and short position were       attempted. Long position led to seating of the ampulla to far laterally       that I could not get a nice view en-fos. The bile duct could not be       cannulated.      I proceeded with attempt at biliary pre-cut fistulotomy measuring 8 mm       in length was made with a monofilament needle knife using a freehand       technique using ERBE electrocautery beginning in the middle of the       ampulla and moving downwards. There was self limited oozing from the       fistulotomy which did not require treatment. I saw what appeared to be       bile flowing from the lower edge of the fistulotomy. Using the       Revolution Jagtome sphincterotome with a Revolution angled 0.025 wire,       the bile duct was deeply cannulated with wire within the intrahepatics.       Contrast was injected. I personally interpreted the bile duct images.       Ductal flow of contrast was adequate. Image quality was adequate.       Contrast extended to the cystic duct. Contrast extended to the hepatic       ducts. Opacification of the entire biliary tree except for the       gallbladder was successful. The lower third of the main duct contained a       filling defect thought to be a stone. The main bile duct was moderately       dilated, with  a stone causing an obstruction. The largest diameter was       10 mm.      To optimize flow, the previous biliary fistulotomy was completed and       extended to a total of 12 mm in length with a monofilament Jagtome       sphincterotome using ERBE electrocautery. There was self limited oozing       from the sphincterotomy which did not require treatment. To discover       objects, the biliary tree was  swept with a retrieval balloon starting at       the bifurcation. Sludge was swept from the duct. Two stones were       removed. No stones remained. An occlusion cholangiogram was performed       that showed no further significant biliary pathology.      A pancreatogram was not performed.      The duodenoscope was withdrawn from the patient. Impression:               - J-shaped deformity of the stomach noted.                            Gastritis - biopsied for HP.                           - No gross lesions in the duodenal bulb, in the                            first portion of the duodenum and in the second                            portion of the duodenum.                           - The major papilla appeared to be bulging and                            edematous.                           - Difficult cannulation requiring eventual use of                            precut fistulotomy and eventual extension of                            overall sphincterotomy.                           - A filling defect consistent with a stone was seen                            on the cholangiogram.                           - The entire main bile duct was moderately dilated,  with a stone causing an obstruction.                           - Two stones, choledocholithiasis, were found.                            Complete removal was accomplished by biliary                            sphincterotomy/fistulotomy and balloon sweep. Moderate Sedation:      Not Applicable - Patient had care per Anesthesia. Recommendation:           - The patient will be observed post-procedure,                            until all discharge criteria are met.                           - Return patient to hospital ward for ongoing care.                           - Clear liquid diet.                           - Observe patient's clinical course.                           - Watch for pancreatitis,  bleeding, perforation,                            and cholangitis.                           - Check liver enzymes (AST, ALT, alkaline                            phosphatase, bilirubin) in the morning.                           - May proceed with cholecystectomy when deemed                            appropriate from a surgical standpoint, hopefully                            tomorrow.                           - Would hold chemical VTE prophylaxis for 24-48                            hours to decrease risk of post-interventional                            bleeding. If anticoagulation is necessary consider  heparin drip without bolus in 6-12 hours and                            monitor closely. However, patient hopefully will be                            going for cholecystectomy soon so would likely hold                            for now.                           - The findings and recommendations were discussed                            with the patient.                           - The findings and recommendations were discussed                            with the patient's family.                           - The findings and recommendations were discussed                            with the referring physician. Procedure Code(s):        --- Professional ---                           [phone], Endoscopic retrograde                            cholangiopancreatography (ERCP); with removal of                            calculi/debris from biliary/pancreatic duct(s)                           43262, Endoscopic retrograde                            cholangiopancreatography (ERCP); with                            sphincterotomy/papillotomy                           43261, Endoscopic retrograde                            cholangiopancreatography (ERCP); with biopsy,                            single or multiple Diagnosis Code(s):        --- Professional ---  K31.89, Other diseases of stomach and duodenum                           K29.70, Gastritis, unspecified, without bleeding                           K80.51, Calculus of bile duct without cholangitis                            or cholecystitis with obstruction                           R17, Unspecified jaundice                           R94.5, Abnormal results of liver function studies                           Z98.890, Other specified postprocedural states                           K83.8, Other specified diseases of biliary tract                           R93.2, Abnormal findings on diagnostic imaging of                            liver and biliary tract CPT copyright 2019 [employer]. All rights reserved. The codes documented in this report are preliminary and upon coder review may  be revised to meet current compliance requirements. Justice Britain, MD 07/18/2020 4:05:22 PM Number of Addenda: 0

## 2020-07-19 ENCOUNTER — Encounter (HOSPITAL_COMMUNITY): Payer: Self-pay

## 2020-07-19 ENCOUNTER — Inpatient Hospital Stay (HOSPITAL_COMMUNITY): Payer: PRIVATE HEALTH INSURANCE

## 2020-07-19 ENCOUNTER — Encounter (HOSPITAL_COMMUNITY): Admission: EM | Disposition: A | Discharge status: Home / Self Care | Payer: Self-pay | Source: Home / Self Care

## 2020-07-19 ENCOUNTER — Inpatient Hospital Stay (HOSPITAL_COMMUNITY): Payer: PRIVATE HEALTH INSURANCE | Admitting: Anesthesiology

## 2020-07-19 HISTORY — PX: CHOLECYSTECTOMY: SHX55

## 2020-07-19 LAB — CBC
HCT: 40.2 % (ref 36.0–46.0)
Hemoglobin: 13.3 g/dL (ref 12.0–15.0)
MCH: 31.9 pg (ref 26.0–34.0)
MCHC: 33.1 g/dL (ref 30.0–36.0)
MCV: 96.4 fL (ref 80.0–100.0)
Platelets: 327 10*3/uL (ref 150–400)
RBC: 4.17 MIL/uL (ref 3.87–5.11)
RDW: 12.2 % (ref 11.5–15.5)
WBC: 9.4 10*3/uL (ref 4.0–10.5)
nRBC: 0 % (ref 0.0–0.2)

## 2020-07-19 LAB — COMPREHENSIVE METABOLIC PANEL
ALT: 494 U/L — ABNORMAL HIGH (ref 0–44)
AST: 214 U/L — ABNORMAL HIGH (ref 15–41)
Albumin: 3.1 g/dL — ABNORMAL LOW (ref 3.5–5.0)
Alkaline Phosphatase: 159 U/L — ABNORMAL HIGH (ref 38–126)
Anion gap: 10 (ref 5–15)
BUN: 9 mg/dL (ref 6–20)
CO2: 20 mmol/L — ABNORMAL LOW (ref 22–32)
Calcium: 9.3 mg/dL (ref 8.9–10.3)
Chloride: 108 mmol/L (ref 98–111)
Creatinine, Ser: 0.83 mg/dL (ref 0.44–1.00)
GFR, Estimated: >60 mL/min (ref >60)
Glucose, Bld: 116 mg/dL — ABNORMAL HIGH (ref 70–99)
Potassium: 4 mmol/L (ref 3.5–5.1)
Sodium: 138 mmol/L (ref 135–145)
Total Bilirubin: 5.6 mg/dL — ABNORMAL HIGH (ref 0.3–1.2)
Total Protein: 7 g/dL (ref 6.5–8.1)

## 2020-07-19 LAB — LIPASE, BLOOD: Lipase: 52 U/L — ABNORMAL HIGH (ref 11–51)

## 2020-07-19 SURGERY — LAPAROSCOPIC CHOLECYSTECTOMY
Anesthesia: General

## 2020-07-19 MED ORDER — HYDRALAZINE HCL 20 MG/ML IJ SOLN
INTRAMUSCULAR | Status: DC | PRN
  Administered 2020-07-19 (×3): 5 mg via INTRAVENOUS

## 2020-07-19 MED ORDER — LABETALOL HCL 5 MG/ML IV SOLN
INTRAVENOUS | Status: DC | PRN
  Administered 2020-07-19: 5 mg via INTRAVENOUS

## 2020-07-19 MED ORDER — PROPOFOL 500 MG/50ML IV EMUL
INTRAVENOUS | Status: DC | PRN
  Administered 2020-07-19: 75 ug/kg/min via INTRAVENOUS

## 2020-07-19 MED ORDER — METHOCARBAMOL 1000 MG/10ML IJ SOLN
500.0000 mg | Freq: Four times a day (QID) | INTRAVENOUS | Status: DC | PRN
  Filled 2020-07-19: qty 5

## 2020-07-19 MED ORDER — ROCURONIUM BROMIDE 10 MG/ML (PF) SYRINGE
PREFILLED_SYRINGE | INTRAVENOUS | Status: AC
  Filled 2020-07-19: qty 10

## 2020-07-19 MED ORDER — LACTATED RINGERS IV SOLN: INTRAVENOUS | Status: DC | PRN

## 2020-07-19 MED ORDER — POTASSIUM CHLORIDE IN NACL 20-0.9 MEQ/L-% IV SOLN
INTRAVENOUS | Status: DC
  Filled 2020-07-19: qty 1000

## 2020-07-19 MED ORDER — SCOPOLAMINE 1 MG/3DAYS TD PT72
MEDICATED_PATCH | TRANSDERMAL | Status: DC | PRN
  Administered 2020-07-19: 1 via TRANSDERMAL

## 2020-07-19 MED ORDER — KETAMINE HCL 10 MG/ML IJ SOLN
INTRAMUSCULAR | Status: DC | PRN
  Administered 2020-07-19: 30 mg via INTRAVENOUS

## 2020-07-19 MED ORDER — LIDOCAINE 2% (20 MG/ML) 5 ML SYRINGE
INTRAMUSCULAR | Status: DC | PRN
  Administered 2020-07-19: 100 mg via INTRAVENOUS

## 2020-07-19 MED ORDER — DEXAMETHASONE SODIUM PHOSPHATE 10 MG/ML IJ SOLN
INTRAMUSCULAR | Status: AC
  Filled 2020-07-19: qty 1

## 2020-07-19 MED ORDER — ENOXAPARIN SODIUM 80 MG/0.8ML ~~LOC~~ SOLN
70.0000 mg | SUBCUTANEOUS | Status: DC
  Administered 2020-07-20: 70 mg via SUBCUTANEOUS
  Filled 2020-07-19: qty 0.8

## 2020-07-19 MED ORDER — PROPOFOL 10 MG/ML IV BOLUS
INTRAVENOUS | Status: DC | PRN
  Administered 2020-07-19: 200 mg via INTRAVENOUS

## 2020-07-19 MED ORDER — FENTANYL CITRATE (PF) 100 MCG/2ML IJ SOLN
INTRAMUSCULAR | Status: DC | PRN
  Administered 2020-07-19: 50 ug via INTRAVENOUS
  Administered 2020-07-19: 150 ug via INTRAVENOUS
  Administered 2020-07-19: 100 ug via INTRAVENOUS
  Administered 2020-07-19 (×4): 50 ug via INTRAVENOUS

## 2020-07-19 MED ORDER — METOPROLOL TARTRATE 5 MG/5ML IV SOLN
5.0000 mg | Freq: Once | INTRAVENOUS | Status: AC
  Administered 2020-07-19: 5 mg via INTRAVENOUS
  Filled 2020-07-19: qty 5

## 2020-07-19 MED ORDER — ROCURONIUM BROMIDE 10 MG/ML (PF) SYRINGE
PREFILLED_SYRINGE | INTRAVENOUS | Status: DC | PRN
  Administered 2020-07-19 (×2): 20 mg via INTRAVENOUS
  Administered 2020-07-19: 10 mg via INTRAVENOUS
  Administered 2020-07-19: 80 mg via INTRAVENOUS

## 2020-07-19 MED ORDER — SUGAMMADEX SODIUM 500 MG/5ML IV SOLN
INTRAVENOUS | Status: AC
  Filled 2020-07-19: qty 5

## 2020-07-19 MED ORDER — BUPIVACAINE LIPOSOME 1.3 % IJ SUSP
20.0000 mL | Freq: Once | INTRAMUSCULAR | Status: AC
  Administered 2020-07-19: 20 mL
  Filled 2020-07-19: qty 20

## 2020-07-19 MED ORDER — 0.9 % SODIUM CHLORIDE (POUR BTL) OPTIME
TOPICAL | Status: DC | PRN
  Administered 2020-07-19: 1000 mL

## 2020-07-19 MED ORDER — PROMETHAZINE HCL 25 MG/ML IJ SOLN: 6.2500 mg | INTRAMUSCULAR | Status: DC | PRN

## 2020-07-19 MED ORDER — MIDAZOLAM HCL 5 MG/5ML IJ SOLN
INTRAMUSCULAR | Status: DC | PRN
  Administered 2020-07-19: 2 mg via INTRAVENOUS

## 2020-07-19 MED ORDER — MIDAZOLAM HCL 2 MG/2ML IJ SOLN
INTRAMUSCULAR | Status: AC
  Filled 2020-07-19: qty 2

## 2020-07-19 MED ORDER — DEXAMETHASONE SODIUM PHOSPHATE 10 MG/ML IJ SOLN
INTRAMUSCULAR | Status: DC | PRN
  Administered 2020-07-19: 6 mg via INTRAVENOUS

## 2020-07-19 MED ORDER — SCOPOLAMINE 1 MG/3DAYS TD PT72
MEDICATED_PATCH | TRANSDERMAL | Status: AC
  Filled 2020-07-19: qty 1

## 2020-07-19 MED ORDER — ONDANSETRON HCL 4 MG/2ML IJ SOLN
INTRAMUSCULAR | Status: DC | PRN
  Administered 2020-07-19: 4 mg via INTRAVENOUS

## 2020-07-19 MED ORDER — PROPOFOL 10 MG/ML IV BOLUS
INTRAVENOUS | Status: AC
  Filled 2020-07-19: qty 20

## 2020-07-19 MED ORDER — IOHEXOL 300 MG/ML  SOLN
INTRAMUSCULAR | Status: DC | PRN
  Administered 2020-07-19: 18.5 mL

## 2020-07-19 MED ORDER — KETOROLAC TROMETHAMINE 30 MG/ML IJ SOLN: 30.0000 mg | Freq: Once | INTRAMUSCULAR | Status: DC | PRN

## 2020-07-19 MED ORDER — RINGERS IRRIGATION IR SOLN
Status: DC | PRN
  Administered 2020-07-19: 1000 mL

## 2020-07-19 MED ORDER — FENTANYL CITRATE (PF) 250 MCG/5ML IJ SOLN
INTRAMUSCULAR | Status: AC
  Filled 2020-07-19: qty 10

## 2020-07-19 MED ORDER — HYDROMORPHONE HCL 1 MG/ML IJ SOLN
INTRAMUSCULAR | Status: AC
  Filled 2020-07-19: qty 1

## 2020-07-19 MED ORDER — LIDOCAINE HCL (PF) 2 % IJ SOLN
INTRAMUSCULAR | Status: AC
  Filled 2020-07-19: qty 5

## 2020-07-19 MED ORDER — HYDROMORPHONE HCL 1 MG/ML IJ SOLN
0.2500 mg | INTRAMUSCULAR | Status: DC | PRN
  Administered 2020-07-19: 0.5 mg via INTRAVENOUS

## 2020-07-19 MED ORDER — PROPOFOL 1000 MG/100ML IV EMUL
INTRAVENOUS | Status: AC
  Filled 2020-07-19: qty 100

## 2020-07-19 MED ORDER — ONDANSETRON HCL 4 MG/2ML IJ SOLN
INTRAMUSCULAR | Status: AC
  Filled 2020-07-19: qty 2

## 2020-07-19 MED ORDER — HYDRALAZINE HCL 20 MG/ML IJ SOLN
INTRAMUSCULAR | Status: AC
  Filled 2020-07-19: qty 1

## 2020-07-19 SURGICAL SUPPLY — 42 items
APPLICATOR COTTON TIP 6 STRL (MISCELLANEOUS) ×2 IMPLANT
APPLICATOR COTTON TIP 6IN STRL (MISCELLANEOUS) ×4 IMPLANT
APPLIER CLIP 5 13 M/L LIGAMAX5 (MISCELLANEOUS)
APPLIER CLIP ROT 10 11.4 M/L (STAPLE) ×2
BENZOIN TINCTURE PRP APPL 2/3 (GAUZE/BANDAGES/DRESSINGS) IMPLANT
CABLE HIGH FREQUENCY MONO STRZ (ELECTRODE) IMPLANT
CATH REDDICK CHOLANGI 4FR 50CM (CATHETERS) ×2 IMPLANT
CLIP APPLIE 5 13 M/L LIGAMAX5 (MISCELLANEOUS) IMPLANT
CLIP APPLIE ROT 10 11.4 M/L (STAPLE) IMPLANT
COVER MAYO STAND STRL (DRAPES) ×2 IMPLANT
COVER SURGICAL LIGHT HANDLE (MISCELLANEOUS) ×2 IMPLANT
COVER WAND RF STERILE (DRAPES) IMPLANT
DECANTER SPIKE VIAL GLASS SM (MISCELLANEOUS) ×2 IMPLANT
DERMABOND ADVANCED (GAUZE/BANDAGES/DRESSINGS) ×1
DERMABOND ADVANCED .7 DNX12 (GAUZE/BANDAGES/DRESSINGS) ×1 IMPLANT
DRAPE C-ARM 42X120 X-RAY (DRAPES) ×2 IMPLANT
ELECT L-HOOK LAP 45CM DISP (ELECTROSURGICAL) ×2
ELECT PENCIL ROCKER SW 15FT (MISCELLANEOUS) ×1 IMPLANT
ELECT REM PT RETURN 15FT ADLT (MISCELLANEOUS) ×2 IMPLANT
ELECTRODE L-HOOK LAP 45CM DISP (ELECTROSURGICAL) IMPLANT
GLOVE BIOGEL M 8.0 STRL (GLOVE) ×2 IMPLANT
GLOVE ECLIPSE 6.5 STRL STRAW (GLOVE) ×1 IMPLANT
GOWN STRL REUS W/TWL XL LVL3 (GOWN DISPOSABLE) ×2 IMPLANT
HEMOSTAT SURGICEL 4X8 (HEMOSTASIS) IMPLANT
IV CATH 14GX2 1/4 (CATHETERS) ×2 IMPLANT
KIT BASIN OR (CUSTOM PROCEDURE TRAY) ×2 IMPLANT
KIT TURNOVER KIT A (KITS) IMPLANT
MAT PREVALON FULL STRYKER (MISCELLANEOUS) ×1 IMPLANT
POUCH RETRIEVAL ECOSAC 10 (ENDOMECHANICALS) IMPLANT
POUCH RETRIEVAL ECOSAC 10MM (ENDOMECHANICALS)
SCISSORS LAP 5X45 EPIX DISP (ENDOMECHANICALS) ×2 IMPLANT
SET IRRIG TUBING LAPAROSCOPIC (IRRIGATION / IRRIGATOR) ×2 IMPLANT
SET TUBE SMOKE EVAC HIGH FLOW (TUBING) ×2 IMPLANT
SLEEVE XCEL OPT CAN 5 100 (ENDOMECHANICALS) ×4 IMPLANT
STRIP CLOSURE SKIN 1/2X4 (GAUZE/BANDAGES/DRESSINGS) IMPLANT
SUT MNCRL AB 4-0 PS2 18 (SUTURE) ×4 IMPLANT
SYR 20ML LL LF (SYRINGE) ×2 IMPLANT
TOWEL OR 17X26 10 PK STRL BLUE (TOWEL DISPOSABLE) ×2 IMPLANT
TRAY LAPAROSCOPIC (CUSTOM PROCEDURE TRAY) ×2 IMPLANT
TROCAR BLADELESS OPT 5 100 (ENDOMECHANICALS) ×2 IMPLANT
TROCAR XCEL BLUNT TIP 100MML (ENDOMECHANICALS) IMPLANT
TROCAR XCEL NON-BLD 11X100MML (ENDOMECHANICALS) ×1 IMPLANT

## 2020-07-19 NOTE — Transfer of Care (Signed)
Immediate Anesthesia Transfer of Care Note  Patient: Tina Figueroa  Procedure(s) Performed: LAPAROSCOPIC CHOLECYSTECTOMY with intraoperative cholangiogram (N/A )  Patient Location: PACU  Anesthesia Type:General  Level of Consciousness: awake, alert , oriented and patient cooperative  Airway & Oxygen Therapy: Patient Spontanous Breathing and Patient connected to face mask oxygen  Post-op Assessment: Report given to RN, Post -op Vital signs reviewed and stable and Patient moving all extremities X 4  Post vital signs: stable  Last Vitals:  Vitals Value Taken Time  BP 137/72 07/19/20 1206  Temp 36.6 C 07/19/20 1206  Pulse 93 07/19/20 1215  Resp 20 07/19/20 1215  SpO2 98 % 07/19/20 1215  Vitals shown include unvalidated device data.  Last Pain:  Vitals:   07/19/20 1206  TempSrc:   PainSc: 4       Patients Stated Pain Goal: 2 (07/17/20 1328)  Complications: No complications documented.

## 2020-07-19 NOTE — Anesthesia Preprocedure Evaluation (Signed)
Anesthesia Evaluation  Patient identified by MRN, date of birth, ID band Patient awake    Reviewed: Allergy & Precautions, H&P , NPO status , Patient's Chart, lab work & pertinent test results  Airway Mallampati: II  TM Distance: >3 FB Neck ROM: Full    Dental no notable dental hx.    Pulmonary sleep apnea ,    Pulmonary exam normal breath sounds clear to auscultation       Cardiovascular hypertension, Pt. on medications and Pt. on home beta blockers Normal cardiovascular exam Rhythm:Regular Rate:Normal     Neuro/Psych negative neurological ROS  negative psych ROS   GI/Hepatic negative GI ROS, Neg liver ROS,   Endo/Other  Morbid obesity  Renal/GU negative Renal ROS  negative genitourinary   Musculoskeletal negative musculoskeletal ROS (+)   Abdominal (+) + obese,   Peds negative pediatric ROS (+)  Hematology negative hematology ROS (+)   Anesthesia Other Findings   Reproductive/Obstetrics negative OB ROS                             Anesthesia Physical Anesthesia Plan  ASA: III  Anesthesia Plan: General   Post-op Pain Management:    Induction: Intravenous  PONV Risk Score and Plan: 3 and Ondansetron, Dexamethasone, Treatment may vary due to age or medical condition and Midazolam  Airway Management Planned: Oral ETT  Additional Equipment:   Intra-op Plan:   Post-operative Plan: Extubation in OR  Informed Consent: I have reviewed the patients History and Physical, chart, labs and discussed the procedure including the risks, benefits and alternatives for the proposed anesthesia with the patient or authorized representative who has indicated his/her understanding and acceptance.     Dental advisory given  Plan Discussed with: CRNA and Surgeon  Anesthesia Plan Comments:         Anesthesia Quick Evaluation

## 2020-07-19 NOTE — Anesthesia Postprocedure Evaluation (Signed)
Anesthesia Post Note  Patient: Tina Figueroa  Procedure(s) Performed: LAPAROSCOPIC CHOLECYSTECTOMY with intraoperative cholangiogram (N/A )     Patient location during evaluation: PACU Anesthesia Type: General Level of consciousness: awake and alert Pain management: pain level controlled Vital Signs Assessment: post-procedure vital signs reviewed and stable Respiratory status: spontaneous breathing, nonlabored ventilation, respiratory function stable and patient connected to nasal cannula oxygen Cardiovascular status: blood pressure returned to baseline and stable Postop Assessment: no apparent nausea or vomiting Anesthetic complications: no   No complications documented.  Last Vitals:  Vitals:   07/19/20 1230 07/19/20 1245  BP: (!) 154/114 (!) 149/86  Pulse: 92 97  Resp: 20 20  Temp:    SpO2: 98% 98%    Last Pain:  Vitals:   07/19/20 1245  TempSrc:   PainSc: 5                  Demia Viera S

## 2020-07-19 NOTE — Discharge Instructions (Signed)
[facility]  Please arrive at least 30 min before your appointment to complete your check in paperwork.  If you are unable to arrive 30 min prior to your appointment time we may have to cancel or reschedule you. LAPAROSCOPIC SURGERY: POST OP INSTRUCTIONS Always review your discharge instruction sheet given to you by the facility where your surgery was performed. IF YOU HAVE DISABILITY OR FAMILY LEAVE FORMS, YOU MUST BRING THEM TO THE OFFICE FOR PROCESSING.   DO NOT GIVE THEM TO YOUR DOCTOR.  PAIN CONTROL  1. First take acetaminophen (Tylenol) AND/or ibuprofen (Advil) to control your pain after surgery.  Follow directions on package.  Taking acetaminophen (Tylenol) and/or ibuprofen (Advil) regularly after surgery will help to control your pain and lower the amount of prescription pain medication you may need.  You should not take more than 4,000 mg (4 grams) of acetaminophen (Tylenol) in 24 hours.  You should not take ibuprofen (Advil), aleve, motrin, naprosyn or other NSAIDS if you have a history of stomach ulcers or chronic kidney disease.  2. A prescription for pain medication may be given to you upon discharge.  Take your pain medication as prescribed, if you still have uncontrolled pain after taking acetaminophen (Tylenol) or ibuprofen (Advil). 3. Use ice packs to help control pain. 4. If you need a refill on your pain medication, please contact your pharmacy.  They will contact our office to request authorization. Prescriptions will not be filled after 5pm or on week-ends.  HOME MEDICATIONS 5. Take your usually prescribed medications unless otherwise directed.  DIET 6. You should follow a light diet the first few days after arrival home.  Be sure to include lots of fluids daily. Avoid fatty, fried foods.   CONSTIPATION 7. It is common to experience some constipation after surgery and if you are taking pain medication.  Increasing fluid intake and taking a stool  softener (such as Colace) will usually help or prevent this problem from occurring.  A mild laxative (Milk of Magnesia or Miralax) should be taken according to package instructions if there are no bowel movements after 48 hours.  WOUND/INCISION CARE 8. Most patients will experience some swelling and bruising in the area of the incisions.  Ice packs will help.  Swelling and bruising can take several days to resolve.  9. Unless discharge instructions indicate otherwise, follow guidelines below  a. STERI-STRIPS - you may remove your outer bandages 48 hours after surgery, and you may shower at that time.  You have steri-strips (small skin tapes) in place directly over the incision.  These strips should be left on the skin for 7-10 days.   b. DERMABOND/SKIN GLUE - you may shower in 24 hours.  The glue will flake off over the next 2-3 weeks. 10. Any sutures or staples will be removed at the office during your follow-up visit.  ACTIVITIES 11. You may resume regular (light) daily activities beginning the next day--such as daily self-care, walking, climbing stairs--gradually increasing activities as tolerated.  You may have sexual intercourse when it is comfortable.  Refrain from any heavy lifting or straining until approved by your doctor. a. You may drive when you are no longer taking prescription pain medication, you can comfortably wear a seatbelt, and you can safely maneuver your car and apply brakes.  FOLLOW-UP 12. You should see your doctor in the office for a follow-up appointment approximately 2-3 weeks after your surgery.  You should have been given your post-op/follow-up appointment when   your surgery was scheduled.  If you did not receive a post-op/follow-up appointment, make sure that you call for this appointment within a day or two after you arrive home to insure a convenient appointment time.   WHEN TO CALL YOUR DOCTOR: 1. Fever over 101.0 2. Inability to urinate 3. Continued bleeding from  incision. 4. Increased pain, redness, or drainage from the incision. 5. Increasing abdominal pain  The clinic staff is available to answer your questions during regular business hours.  Please don't hesitate to call and ask to speak to one of the nurses for clinical concerns.  If you have a medical emergency, go to the nearest emergency room or call 911.  A surgeon from [facility] is always on call at the hospital. [address], [address], [place], [place]  [address] ? [address] [address], , [place]   [address] [phone] ? [phone] ? FAX [phone]  .........   Managing Your Pain After Surgery Without Opioids    Thank you for participating in our program to help patients manage their pain after surgery without opioids. This is part of our effort to provide you with the best care possible, without exposing you or your family to the risk that opioids pose.  What pain can I expect after surgery? You can expect to have some pain after surgery. This is normal. The pain is typically worse the day after surgery, and quickly begins to get better. Many studies have found that many patients are able to manage their pain after surgery with Over-the-Counter (OTC) medications such as Tylenol and Motrin. If you have a condition that does not allow you to take Tylenol or Motrin, notify your surgical team.  How will I manage my pain? The best strategy for controlling your pain after surgery is around the clock pain control with Tylenol (acetaminophen) and Motrin (ibuprofen or Advil). Alternating these medications with each other allows you to maximize your pain control. In addition to Tylenol and Motrin, you can use heating pads or ice packs on your incisions to help reduce your pain.  How will I alternate your regular strength over-the-counter pain medication? You will take a dose of pain medication every three hours. ; Start by taking 650 mg of Tylenol (2 pills of 325  mg) ; 3 hours later take 600 mg of Motrin (3 pills of 200 mg) ; 3 hours after taking the Motrin take 650 mg of Tylenol ; 3 hours after that take 600 mg of Motrin.   - 1 -  See example - if your first dose of Tylenol is at 12:00 PM   12:00 PM Tylenol 650 mg (2 pills of 325 mg)  3:00 PM Motrin 600 mg (3 pills of 200 mg)  6:00 PM Tylenol 650 mg (2 pills of 325 mg)  9:00 PM Motrin 600 mg (3 pills of 200 mg)  Continue alternating every 3 hours   We recommend that you follow this schedule around-the-clock for at least 3 days after surgery, or until you feel that it is no longer needed. Use the table on the last page of this handout to keep track of the medications you are taking. Important: Do not take more than 3000mg of Tylenol or 3200mg of Motrin in a 24-hour period. Do not take ibuprofen/Motrin if you have a history of bleeding stomach ulcers, severe kidney disease, &/or actively taking a blood thinner  What if I still have pain? If you have pain that is not   controlled with the over-the-counter pain medications (Tylenol and Motrin or Advil) you might have what we call "breakthrough" pain. You will receive a prescription for a small amount of an opioid pain medication such as Oxycodone, Tramadol, or Tylenol with Codeine. Use these opioid pills in the first 24 hours after surgery if you have breakthrough pain. Do not take more than 1 pill every 4-6 hours.  If you still have uncontrolled pain after using all opioid pills, don't hesitate to call our staff using the number provided. We will help make sure you are managing your pain in the best way possible, and if necessary, we can provide a prescription for additional pain medication.   Day 1    Time  Name of Medication Number of pills taken  Amount of Acetaminophen  Pain Level   Comments  AM PM       AM PM       AM PM       AM PM       AM PM       AM PM       AM PM       AM PM       Total Daily amount of Acetaminophen Do not  take more than  3,000 mg per day      Day 2    Time  Name of Medication Number of pills taken  Amount of Acetaminophen  Pain Level   Comments  AM PM       AM PM       AM PM       AM PM       AM PM       AM PM       AM PM       AM PM       Total Daily amount of Acetaminophen Do not take more than  3,000 mg per day      Day 3    Time  Name of Medication Number of pills taken  Amount of Acetaminophen  Pain Level   Comments  AM PM       AM PM       AM PM       AM PM          AM PM       AM PM       AM PM       AM PM       Total Daily amount of Acetaminophen Do not take more than  3,000 mg per day      Day 4    Time  Name of Medication Number of pills taken  Amount of Acetaminophen  Pain Level   Comments  AM PM       AM PM       AM PM       AM PM       AM PM       AM PM       AM PM       AM PM       Total Daily amount of Acetaminophen Do not take more than  3,000 mg per day      Day 5    Time  Name of Medication Number of pills taken  Amount of Acetaminophen  Pain Level   Comments  AM PM       AM PM       AM   PM       AM PM       AM PM       AM PM       AM PM       AM PM       Total Daily amount of Acetaminophen Do not take more than  3,000 mg per day       Day 6    Time  Name of Medication Number of pills taken  Amount of Acetaminophen  Pain Level  Comments  AM PM       AM PM       AM PM       AM PM       AM PM       AM PM       AM PM       AM PM       Total Daily amount of Acetaminophen Do not take more than  3,000 mg per day      Day 7    Time  Name of Medication Number of pills taken  Amount of Acetaminophen  Pain Level   Comments  AM PM       AM PM       AM PM       AM PM       AM PM       AM PM       AM PM       AM PM       Total Daily amount of Acetaminophen Do not take more than  3,000 mg per day        For additional information about how and where to safely dispose of unused  opioid medications - https://www.morepowerfulnc.org  Disclaimer: This document contains information and/or instructional materials adapted from [facility] for the typical patient with your condition. It does not replace medical advice from your health care provider because your experience may differ from that of the typical patient. Talk to your health care provider if you have any questions about this document, your condition or your treatment plan. Adapted from [facility]   

## 2020-07-19 NOTE — Interval H&P Note (Signed)
History and Physical Interval Note:  07/19/2020 9:04 AM  Tina Figueroa  has presented today for surgery, with the diagnosis of choledocholithiasis.  The various methods of treatment have been discussed with the patient and family. After consideration of risks, benefits and other options for treatment, the patient has consented to  Procedure(s): LAPAROSCOPIC CHOLECYSTECTOMY (N/A) as a surgical intervention.  The patient's history has been reviewed, patient examined, no change in status, stable for surgery.  I have reviewed the patient's chart and labs.  Questions were answered to the patient's satisfaction.     Valarie Merino

## 2020-07-19 NOTE — Op Note (Signed)
Tina Figueroa  Primary Care Physician:  Sandford Craze, NP    07/19/2020  11:57 AM  Procedure: Laparoscopic Cholecystectomy with intraoperative cholangiogram  Surgeon: Susy Frizzle B. Daphine Deutscher, MD, FACS Asst:  Carlena Bjornstad, PA  Anes:  General  Drains:  None  Findings: Subacute cholecystitis with large tortuous cystic duct and delayed filling of the distal CBD  Description of Procedure: The patient was taken to OR 1 and given general anesthesia.  The patient was prepped with chlorohexidine prep and draped sterilely. A time out was performed including identifying the patient and discussing their procedure.  Access to the abdomen was achieved with a 5 mm Optiview through the right upper quadrant.  Port placement included three 5 mm trocars and one 11 in the upper midline.    The gallbladder was visualized and appeared chronically inflamed.   The fundus of the gallbaldder was grasped and the gallbladder was elevated. Traction on the infundibulum allowed for successful demonstration of the anatomy but  Inflammatory changes were subacute and more tedious to dissect5.  The cystic duct was identified and clipped up on the gallbladder and an incision was made in the cystic duct and the Reddick catheter was inserted after milking the cystic duct of any debris. A dynamic cholangiogram was performed which demonstrated a long tortuous cystic duct going distally on the CBD (consistent with view of the ERCP).  .    The cystic duct was then triple clipped and divided, the cystic artery was double clipped and divided and then the gallbladder was removed from the gallbladder bed. Removal of the gallbladder from the gallbladder bed was performed without entering it.  The gallbladder was then placed in a bag and brought out through one of the trocar sites. The gallbladder bed was inspected and no bleeding or bile leaks were seen.     Incisions were injected with Exparel and closed with 4-0 Monocryl and Dermabond on the  skin.  Sponge and needle count were correct.    The patient was taken to the recovery room in satisfactory condition.

## 2020-07-19 NOTE — Anesthesia Procedure Notes (Signed)
Procedure Name: Intubation Date/Time: 07/19/2020 9:59 AM Performed by: Lissa Morales, CRNA Pre-anesthesia Checklist: Patient identified, Emergency Drugs available, Suction available and Patient being monitored Patient Re-evaluated:Patient Re-evaluated prior to induction Oxygen Delivery Method: Circle system utilized Preoxygenation: Pre-oxygenation with 100% oxygen Induction Type: IV induction Ventilation: Mask ventilation without difficulty Laryngoscope Size: Mac and 4 Grade View: Grade II Tube type: Oral Tube size: 7.5 mm Number of attempts: 1 Airway Equipment and Method: Stylet and Oral airway Placement Confirmation: ETT inserted through vocal cords under direct vision,  positive ETCO2 and breath sounds checked- equal and bilateral Secured at: 22 cm Tube secured with: Tape Dental Injury: Teeth and Oropharynx as per pre-operative assessment

## 2020-07-19 NOTE — Progress Notes (Signed)
Patient already down for cholecystectomy this morning.  Lipase 52.  LFTs stable with total bili down from 7.8 to 5.6.  Will need repeat LFTs tomorrow and we will likely just plan to check on her then.  Please call with any questions in the interim.

## 2020-07-20 ENCOUNTER — Other Ambulatory Visit: Payer: Self-pay

## 2020-07-20 ENCOUNTER — Encounter (HOSPITAL_COMMUNITY): Payer: Self-pay | Admitting: Surgery

## 2020-07-20 LAB — COMPREHENSIVE METABOLIC PANEL
ALT: 547 U/L — ABNORMAL HIGH (ref 0–44)
AST: 280 U/L — ABNORMAL HIGH (ref 15–41)
Albumin: 3 g/dL — ABNORMAL LOW (ref 3.5–5.0)
Alkaline Phosphatase: 138 U/L — ABNORMAL HIGH (ref 38–126)
Anion gap: 9 (ref 5–15)
BUN: 11 mg/dL (ref 6–20)
CO2: 23 mmol/L (ref 22–32)
Calcium: 9.2 mg/dL (ref 8.9–10.3)
Chloride: 108 mmol/L (ref 98–111)
Creatinine, Ser: 0.93 mg/dL (ref 0.44–1.00)
GFR, Estimated: >60 mL/min (ref >60)
Glucose, Bld: 95 mg/dL (ref 70–99)
Potassium: 4.1 mmol/L (ref 3.5–5.1)
Sodium: 140 mmol/L (ref 135–145)
Total Bilirubin: 4.9 mg/dL — ABNORMAL HIGH (ref 0.3–1.2)
Total Protein: 6.6 g/dL (ref 6.5–8.1)

## 2020-07-20 LAB — CBC
HCT: 40 % (ref 36.0–46.0)
Hemoglobin: 12.8 g/dL (ref 12.0–15.0)
MCH: 32.2 pg (ref 26.0–34.0)
MCHC: 32 g/dL (ref 30.0–36.0)
MCV: 100.5 fL — ABNORMAL HIGH (ref 80.0–100.0)
Platelets: 326 10*3/uL (ref 150–400)
RBC: 3.98 MIL/uL (ref 3.87–5.11)
RDW: 12.9 % (ref 11.5–15.5)
WBC: 13.3 10*3/uL — ABNORMAL HIGH (ref 4.0–10.5)
nRBC: 0 % (ref 0.0–0.2)

## 2020-07-20 LAB — SURGICAL PATHOLOGY

## 2020-07-20 MED ORDER — OXYCODONE HCL 5 MG PO TABS
5.0000 mg | ORAL_TABLET | Freq: Four times a day (QID) | ORAL | 0 refills | Status: DC | PRN
Start: 2020-07-20 — End: 2020-09-02

## 2020-07-20 MED ORDER — POLYETHYLENE GLYCOL 3350 17 G PO PACK: 17.0000 g | PACK | Freq: Every day | ORAL | 0 refills | Status: DC | PRN

## 2020-07-20 MED ORDER — ACETAMINOPHEN 500 MG PO TABS: 1000.0000 mg | ORAL_TABLET | Freq: Four times a day (QID) | ORAL | 0 refills | Status: AC | PRN

## 2020-07-20 NOTE — Progress Notes (Signed)
Discussed with patient discharge instructions, they verbalized agreement and understanding.  Patient to leave in private vehicle with all belongings.   

## 2020-07-20 NOTE — Discharge Summary (Signed)
[facility] Discharge Summary   Patient ID: Tina Figueroa MRN: [id number] DOB/AGE: Mar 02, 1981 40 y.o.  Admit date: 07/14/2020 Discharge date: 07/20/2020  Admitting Diagnosis: Choledocholithiasis Acute cholecystitis  Discharge Diagnosis Patient Active Problem List   Diagnosis Date Noted  . Cholelithiasis with acute cholecystitis 07/15/2020  . Choledocholithiasis   . Choledocholithiasis with acute cholecystitis with obstruction 07/14/2020  . Abnormal liver enzymes   . GERD (gastroesophageal reflux disease) 02/08/2020  . B12 deficiency 04/09/2016  . Fatty liver 09/04/2014  . Hyperglycemia 01/27/2014  . Hyponatremia 01/27/2014  . Depression 09/13/2013  . Routine general medical examination at a health care facility 03/10/2013  . Sinus tachycardia 04/07/2012  . Metabolic syndrome 04/07/2012  . OSA (obstructive sleep apnea) 03/25/2012  . Mild hyperlipidemia 03/18/2012  . PCOS (polycystic ovarian syndrome) 02/27/2012  . Plantar fasciitis 02/27/2012  . Palpitations 02/27/2012  . HTN (hypertension) 02/27/2012    Consultants Gastroenterology  Imaging: DG Cholangiogram Operative  Result Date: 07/19/2020 CLINICAL DATA:  Intraoperative cholangiogram.  Cholelithiasis. Fluoro time: 43 seconds EXAM: INTRAOPERATIVE CHOLANGIOGRAM TECHNIQUE: Cholangiographic images from the C-arm fluoroscopic device were submitted for interpretation post-operatively. Please see the procedural report for the amount of contrast and the fluoroscopy time utilized. COMPARISON:  07/18/2020 FINDINGS: Three spot images and 3 cine clips for parotid for interpretation. The submitted images demonstrate opacification of the intra and extrahepatic biliary ducts through the cystic duct. There is small amount extravasation into the gallbladder fossa on the second and third cine clips. No filling defects identified within the common bile duct. IMPRESSION: Intraoperative fluoroscopic images above. Electronically  Signed   By: Acquanetta Belling M.D.   On: 07/19/2020 11:43   DG ERCP BILIARY & PANCREATIC DUCTS  Result Date: 07/18/2020 CLINICAL DATA:  ERCP with cholangiogram. EXAM: ERCP TECHNIQUE: Multiple spot images obtained with the fluoroscopic device and submitted for interpretation post-procedure. COMPARISON:  Attempted ERCP-07/15/2020; CT abdomen pelvis-07/14/2020 FLUOROSCOPY TIME:  5 minutes, 59 seconds (166 mGy) FINDINGS: 18 spot fluoroscopic images the right upper abdominal quadrant during ERCP are provided for review. Initial image demonstrates an ERCP probe overlying the right upper abdominal quadrant. Subsequent images demonstrate eventual successful cannulation of the CBD. There is a nonocclusive filling defect within the distal aspect of the CBD, potentially representative of an air bubble though conceivably nonocclusive choledocholithiasis could have a similar appearance. Subsequent images demonstrate insufflation of a balloon within the central aspect of the CBD with subsequent biliary sweeping and presumed stone extraction and sphincterotomy. There is minimal opacification of the intrahepatic biliary tree which appears nondilated. There is minimal opacification of the cystic duct. There is no definitive opacification of the pancreatic duct IMPRESSION: ERCP with biliary sweeping and presumed stone extraction and sphincterotomy. These images were submitted for radiologic interpretation only. Please see the procedural report for the amount of contrast and the fluoroscopy time utilized. Electronically Signed   By: Simonne Come M.D.   On: 07/18/2020 16:09    Procedures Dr. Daphine Deutscher (07/19/2020) - Laparoscopic Cholecystectomy with IOC Dr. Meridee Score (07/18/2020) - ERCP Dr. Marina Goodell (07/15/20) - ERCP  Hospital Course:  Tina Figueroa is a 40yo female who presented to [facility] 07/14/20 with worsening abdominal pain. She developed covid symptoms 06/27/20 and tested positive at urgent care, and subsequently tested negative on  07/06/20. She again developed worsening abdominal pain, nausea, and vomiting 07/11/20 and went to the ED where she was found to have acute cholecystitis with choledocholithiasis. Patient was admitted to the surgical service and started on IV antibiotics. Gastroenterology was  consulted and attempted ERCP 07/15/20; this procedure was aborted due to failed biliary cannulation. She underwent repeat ERCP 1/31 which was successful. She subsequently underwent laparoscopic cholecystectomy the following day. On POD1 the patient was voiding well, tolerating diet, ambulating well, pain well controlled, vital signs stable, incisions c/d/i, LFTs down trending and felt stable for discharge home.  Patient will follow up as below and knows to call with questions or concerns.    I have personally reviewed the patients medication history on the [place] controlled substance database.    Physical Exam: General:  Alert, NAD, pleasant, comfortable Pulm: rate and effort normal Abd:  Soft, ND, appropriately tender, multiple lap incisions C/D/I Skin: warm and dry  Allergies as of 07/20/2020      Reactions   Flagyl [metronidazole Hcl]    Chalky and coated tongue   Metformin And Related Other (See Comments)   Nausea, diarrhea   Spironolactone    Increased menstrual bleeding.      Medication List    STOP taking these medications   buPROPion 150 MG 24 hr tablet Commonly known as: Wellbutrin XL   escitalopram 20 MG tablet Commonly known as: LEXAPRO   fluticasone 50 MCG/ACT nasal spray Commonly known as: FLONASE   meloxicam 7.5 MG tablet Commonly known as: MOBIC   montelukast 10 MG tablet Commonly known as: SINGULAIR     TAKE these medications   acetaminophen 500 MG tablet Commonly known as: TYLENOL Take 2 tablets (1,000 mg total) by mouth every 6 (six) hours as needed for headache, mild pain or fever.   metoprolol succinate 50 MG 24 hr tablet Commonly known as: TOPROL-XL Take 1 tablet (50 mg total) by mouth  daily. Take with or immediately following a meal.   norethindrone-ethinyl estradiol 1-20 MG-MCG tablet Commonly known as: Junel FE 1/20 Take 1 tablet by mouth daily.   ondansetron 4 MG tablet Commonly known as: Zofran Take 1 tablet (4 mg total) by mouth every 8 (eight) hours as needed for nausea or vomiting.   oxyCODONE 5 MG immediate release tablet Commonly known as: Oxy IR/ROXICODONE Take 1 tablet (5 mg total) by mouth every 6 (six) hours as needed.   polyethylene glycol 17 g packet Commonly known as: MIRALAX / GLYCOLAX Take 17 g by mouth daily as needed for mild constipation.         Follow-up Information    [facility] Follow up on 08/09/2020.   Specialty: General Surgery Why: 9:00am, arrive 30 minutes prior to scheduled appointment time for paperwork and check in process.  Contact information: [address] [place] [address] [phone]               Signed: Franne Forts, [facility] 07/20/2020, 8:43 AM Please see Amion for pager number during day hours 7:00am-4:30pm

## 2020-07-23 ENCOUNTER — Encounter: Payer: Self-pay | Admitting: Gastroenterology

## 2020-07-25 ENCOUNTER — Other Ambulatory Visit: Payer: Self-pay

## 2020-07-25 DIAGNOSIS — K760 Fatty (change of) liver, not elsewhere classified: Secondary | ICD-10-CM

## 2020-08-02 ENCOUNTER — Telehealth: Payer: Self-pay | Admitting: Family

## 2020-08-02 NOTE — Telephone Encounter (Signed)
FMLA is complete. 

## 2020-08-02 NOTE — Telephone Encounter (Signed)
Patient advised FMLA form is complete, she will like to pick up. Copy left at front desk file for patient pick up.

## 2020-08-03 NOTE — Telephone Encounter (Signed)
Spoke to patient and will like FMLA to be faxed to # [phone]

## 2020-08-03 NOTE — Telephone Encounter (Signed)
Form faxed

## 2020-08-05 ENCOUNTER — Other Ambulatory Visit (HOSPITAL_BASED_OUTPATIENT_CLINIC_OR_DEPARTMENT_OTHER): Payer: Self-pay | Admitting: Family

## 2020-08-05 DIAGNOSIS — Z1231 Encounter for screening mammogram for malignant neoplasm of breast: Secondary | ICD-10-CM

## 2020-08-09 ENCOUNTER — Ambulatory Visit (INDEPENDENT_AMBULATORY_CARE_PROVIDER_SITE_OTHER): Payer: PRIVATE HEALTH INSURANCE

## 2020-08-09 ENCOUNTER — Other Ambulatory Visit: Payer: Self-pay

## 2020-08-09 DIAGNOSIS — E538 Deficiency of other specified B group vitamins: Secondary | ICD-10-CM

## 2020-08-09 DIAGNOSIS — Z23 Encounter for immunization: Secondary | ICD-10-CM

## 2020-08-09 MED ORDER — CYANOCOBALAMIN 1000 MCG/ML IJ SOLN
1000.0000 ug | Freq: Once | INTRAMUSCULAR | Status: AC
  Administered 2020-08-09: 1000 ug via INTRAMUSCULAR

## 2020-08-09 NOTE — Progress Notes (Signed)
Pt here for monthly B12 injection per Melissa Osullivan  B12 1000mcg given IM, and pt tolerated injection well.  Next B12 injection scheduled for next month  

## 2020-08-20 ENCOUNTER — Other Ambulatory Visit: Payer: Self-pay

## 2020-08-20 ENCOUNTER — Ambulatory Visit (HOSPITAL_BASED_OUTPATIENT_CLINIC_OR_DEPARTMENT_OTHER)
Admission: RE | Admit: 2020-08-20 | Discharge: 2020-08-20 | Disposition: A | Discharge status: Home / Self Care | Payer: PRIVATE HEALTH INSURANCE | Source: Ambulatory Visit | Attending: Family | Admitting: Family

## 2020-08-20 DIAGNOSIS — Z1231 Encounter for screening mammogram for malignant neoplasm of breast: Secondary | ICD-10-CM | POA: Diagnosis present

## 2020-08-29 ENCOUNTER — Other Ambulatory Visit: Payer: Self-pay | Admitting: Family

## 2020-09-02 ENCOUNTER — Encounter: Payer: Self-pay | Admitting: Family

## 2020-09-02 ENCOUNTER — Ambulatory Visit (INDEPENDENT_AMBULATORY_CARE_PROVIDER_SITE_OTHER): Payer: PRIVATE HEALTH INSURANCE | Admitting: Family

## 2020-09-02 ENCOUNTER — Other Ambulatory Visit: Payer: Self-pay

## 2020-09-02 VITALS — BP 140/90 | HR 86 | Temp 98.8°F | Resp 16 | Ht 64.5 in | Wt 301.0 lb

## 2020-09-02 DIAGNOSIS — E785 Hyperlipidemia, unspecified: Secondary | ICD-10-CM | POA: Diagnosis not present

## 2020-09-02 DIAGNOSIS — F32A Depression, unspecified: Secondary | ICD-10-CM

## 2020-09-02 DIAGNOSIS — Z0000 Encounter for general adult medical examination without abnormal findings: Secondary | ICD-10-CM | POA: Diagnosis not present

## 2020-09-02 DIAGNOSIS — G4733 Obstructive sleep apnea (adult) (pediatric): Secondary | ICD-10-CM

## 2020-09-02 DIAGNOSIS — R739 Hyperglycemia, unspecified: Secondary | ICD-10-CM | POA: Diagnosis not present

## 2020-09-02 DIAGNOSIS — E538 Deficiency of other specified B group vitamins: Secondary | ICD-10-CM

## 2020-09-02 DIAGNOSIS — I10 Essential (primary) hypertension: Secondary | ICD-10-CM | POA: Diagnosis not present

## 2020-09-02 LAB — COMPREHENSIVE METABOLIC PANEL
ALT: 31 U/L (ref 0–35)
AST: 23 U/L (ref 0–37)
Albumin: 3.6 g/dL (ref 3.5–5.2)
Alkaline Phosphatase: 87 U/L (ref 39–117)
BUN: 7 mg/dL (ref 6–23)
CO2: 24 mEq/L (ref 19–32)
Calcium: 9.2 mg/dL (ref 8.4–10.5)
Chloride: 107 mEq/L (ref 96–112)
Creatinine, Ser: 0.75 mg/dL (ref 0.40–1.20)
GFR: 99.01 mL/min (ref >60.00)
Glucose, Bld: 73 mg/dL (ref 70–99)
Potassium: 4.4 mEq/L (ref 3.5–5.1)
Sodium: 140 mEq/L (ref 135–145)
Total Bilirubin: 0.7 mg/dL (ref 0.2–1.2)
Total Protein: 6.7 g/dL (ref 6.0–8.3)

## 2020-09-02 LAB — LIPID PANEL
Cholesterol: 203 mg/dL — ABNORMAL HIGH (ref 0–200)
HDL: 46.3 mg/dL (ref >39.00)
LDL Cholesterol: 142 mg/dL — ABNORMAL HIGH (ref 0–99)
NonHDL: 156.6
Total CHOL/HDL Ratio: 4
Triglycerides: 74 mg/dL (ref 0.0–149.0)
VLDL: 14.8 mg/dL (ref 0.0–40.0)

## 2020-09-02 LAB — HEMOGLOBIN A1C: Hgb A1c MFr Bld: 4.6 % (ref 4.6–6.5)

## 2020-09-02 MED ORDER — METOPROLOL SUCCINATE ER 100 MG PO TB24: 100.0000 mg | ORAL_TABLET | Freq: Every day | ORAL | 1 refills | Status: DC

## 2020-09-02 NOTE — Patient Instructions (Signed)
Please change toprol xl to 100mg  once daily.

## 2020-09-02 NOTE — Progress Notes (Signed)
Subjective:    Patient ID: Tina Figueroa, female    DOB: 1980/12/25, 40 y.o.   MRN: [id number]  HPI  Patient presents today for complete physical.  Immunizations: Due for moderna booster Diet: eating better, especially since her gallbladder surgery Wt Readings from Last 3 Encounters:  09/02/20 (!) 301 lb (136.5 kg)  07/18/20 (!) 305 lb (138.3 kg)  06/03/20 (!) 312 lb (141.5 kg)  Exercise: walking regularly Pap Smear: 01/03/17 (scheduled with GYN) Mammogram: up to date Vision: scheduled Dental: scheduled  Depression- reports that lexapro was discontinued in the hospital. Reports that her mood is good off of medication.  b12 deficiency- injection today  HTN- on toprol xl 50mg   BP Readings from Last 3 Encounters:  09/02/20 140/90  07/20/20 (!) 147/95  06/03/20 138/88      Review of Systems  Constitutional: Negative for unexpected weight change.  HENT: Negative for hearing loss and rhinorrhea.   Eyes: Negative for visual disturbance.  Respiratory: Negative for cough and shortness of breath.   Cardiovascular: Negative for chest pain.  Gastrointestinal: Negative for blood in stool, diarrhea, nausea and vomiting.  Genitourinary: Negative for dysuria, frequency, hematuria and menstrual problem.  Musculoskeletal: Negative for arthralgias and myalgias.  Skin: Negative for rash.  Neurological: Negative for headaches.  Hematological: Negative for adenopathy.  Psychiatric/Behavioral:       Mood is good off medications      Past Medical History:  Diagnosis Date  . B12 deficiency 04/09/2016  . Elevated blood pressure reading without diagnosis of hypertension   . History of chicken pox   . Hypertension   . Mild hyperlipidemia 03/18/2012  . OSA (obstructive sleep apnea) 03/25/2012   Severe OSA per sleep study 10/13.   11/13 Palpitations   . PCOS (polycystic ovarian syndrome)      Social History   Socioeconomic History  . Marital status: Married    Spouse name: Not on file   . Number of children: 0  . Years of education: Not on file  . Highest education level: Not on file  Occupational History  . Occupation: [employer]: [employer]  Tobacco Use  . Smoking status: Never Smoker  . Smokeless tobacco: Never Used  Vaping Use  . Vaping Use: Never used  Substance and Sexual Activity  . Alcohol use: Yes    Comment: Rarely  . Drug use: No  . Sexual activity: Yes    Partners: Male  Other Topics Concern  . 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 ADENOIDECTOMY  1989  . BIOPSY  07/18/2020   Procedure: BIOPSY;  Surgeon: 07/20/2020 Meridee Score., MD;  Location: [facility];  Service: Gastroenterology;;  . [facility] N/A 07/19/2020   Procedure: LAPAROSCOPIC CHOLECYSTECTOMY with intraoperative cholangiogram;  Surgeon: 09/16/2020,  Molli Hazard, MD;  Location: [facility];  Service: General;  Laterality: N/A;  . DILATION AND CURETTAGE OF UTERUS    . ERCP N/A 07/15/2020   Procedure: ENDOSCOPIC RETROGRADE CHOLANGIOPANCREATOGRAPHY (ERCP);  Surgeon: Hilarie Fredrickson, MD;  Location: [facility];  Service: Endoscopy;  Laterality: N/A;  . ERCP N/A 07/18/2020   Procedure: ENDOSCOPIC RETROGRADE CHOLANGIOPANCREATOGRAPHY (ERCP);  Surgeon: Lemar Lofty., MD;  Location: [facility];  Service: Gastroenterology;  Laterality: N/A;  . REMOVAL OF STONES  07/18/2020   Procedure: REMOVAL  OF STONES;  Surgeon: Meridee Score [facility] MD;  Location: [facility];  Service: Gastroenterology;;  . Dennison Mascot  07/18/2020   Procedure: Dennison Mascot;  Surgeon: Lemar Lofty., MD;  Location: [facility];  Service: Gastroenterology;;  . TONSILLECTOMY    . WISDOM TOOTH EXTRACTION      Family History  Problem Relation Age of Onset  . Heart disease Mother        CHF  . Hypertension Mother   . Depression Mother   . Asthma Mother   . Polycystic ovary syndrome Mother   . Arthritis Father   . Cancer Other        aunt  . Diabetes Maternal Aunt   . Arthritis Maternal Uncle   . Diabetes Paternal Uncle   . Arthritis Maternal Grandmother   . Cancer Maternal Grandmother        lung  . Arthritis Paternal Grandmother   . Cancer Paternal Grandmother        breast  . Stroke Paternal Grandmother   . Diabetes Paternal Grandmother   . Cancer Paternal Grandfather        colon and prostate  . Cancer Paternal Aunt 11       breast    Allergies  Allergen Reactions  . Flagyl [Metronidazole Hcl]     Chalky and coated tongue  . Metformin And Related Other (See Comments)    Nausea, diarrhea  . Spironolactone     Increased menstrual bleeding.    Current Outpatient Medications on File Prior to Visit  Medication Sig Dispense Refill  . acetaminophen (TYLENOL) 500 MG tablet Take 2 tablets (1,000 mg total) by mouth every 6 (six) hours as needed for headache, mild pain or fever. 30 tablet 0  . meloxicam (MOBIC) 7.5 MG tablet TAKE 1 TABLET DAILY 90 tablet 3  . metoprolol succinate (TOPROL-XL) 50 MG 24 hr tablet Take 1 tablet (50 mg total) by mouth daily. Take with or immediately following a meal. 90 tablet 1  . norethindrone-ethinyl estradiol (JUNEL FE 1/20) 1-20 MG-MCG tablet Take 1 tablet by mouth daily. 3 Package 4  . ondansetron (ZOFRAN) 4 MG tablet Take 1 tablet (4 mg total) by mouth every 8 (eight) hours as needed for nausea or vomiting. 20 tablet 0  . oxyCODONE (OXY  IR/ROXICODONE) 5 MG immediate release tablet Take 1 tablet (5 mg total) by mouth every 6 (six) hours as needed. 15 tablet 0  . polyethylene glycol (MIRALAX / GLYCOLAX) 17 g packet Take 17 g by mouth daily as needed for mild constipation. 14 each 0   Current Facility-Administered Medications on File Prior to Visit  Medication Dose Route Frequency Provider Last Rate Last Admin  . cyanocobalamin ((VITAMIN B-12)) injection 1,000 mcg  1,000 mcg Intramuscular Q30 days Sandford Craze, NP   1,000 mcg at 09/02/20 0823    BP 140/90 (BP Location: Right Arm, Patient Position: Sitting, Cuff Size: Large)   Pulse 86   Temp 98.8 F (37.1 C) (Oral)  Resp 16   Ht 5' 4.5" (1.638 m)   Wt (!) 301 lb (136.5 kg)   SpO2 99%   BMI 50.87 kg/m       Objective:   Physical Exam  Physical Exam  Constitutional: She is oriented to person, place, and time. She appears well-developed and well-nourished. No distress.  HENT:  Head: Normocephalic and atraumatic.  Right Ear: Tympanic membrane and ear canal normal.  Left Ear: Tympanic membrane and ear canal normal.  Mouth/Throat: Not examined- pt wearing mask Eyes: Pupils are equal, round, and reactive to light. No scleral icterus.  Neck: Normal range of motion. No thyromegaly present.  Cardiovascular: Normal rate and regular rhythm.   No murmur heard. Pulmonary/Chest: Effort normal and breath sounds normal. No respiratory distress. He has no wheezes. She has no rales. She exhibits no tenderness.  Abdominal: Soft. Bowel sounds are normal. She exhibits no distension and no mass. There is no tenderness. There is no rebound and no guarding.  Musculoskeletal: She exhibits no edema.  Lymphadenopathy:    She has no cervical adenopathy.  Neurological: She is alert and oriented to person, place, and time. She has normal patellar reflexes. She exhibits normal muscle tone. Coordination normal.  Skin: Skin is warm and dry.  Psychiatric: She has a normal mood and  affect. Her behavior is normal. Judgment and thought content normal.  Breast/pelvic: deferred        Assessment & Plan:   Preventative care- discussed diet/exercise/weight loss. Recommended covid booster. Other immunizations up to date.  Pap per GYN, mammo up to date.  HTN- uncontrolled. Increase toprol xl from 50mg  to 100mg  once daily.   b12 deficiency- b12 injection today.  Depression- mood is stable off of lexapro. Advised pt OK to remain off. She will let me know if she has any future issues with mood.  OSA-does not wear a cpap. Refer to sleep specialist for re-evaluation/follow up.  This visit occurred during the SARS-CoV-2 public health emergency.  Safety protocols were in place, including screening questions prior to the visit, additional usage of staff PPE, and extensive cleaning of exam room while observing appropriate contact time as indicated for disinfecting solutions.         Assessment & Plan:

## 2020-09-14 ENCOUNTER — Ambulatory Visit (INDEPENDENT_AMBULATORY_CARE_PROVIDER_SITE_OTHER): Payer: PRIVATE HEALTH INSURANCE | Admitting: Obstetrics & Gynecology

## 2020-09-14 ENCOUNTER — Encounter: Payer: Self-pay | Admitting: Obstetrics & Gynecology

## 2020-09-14 ENCOUNTER — Other Ambulatory Visit: Payer: Self-pay

## 2020-09-14 ENCOUNTER — Other Ambulatory Visit (HOSPITAL_COMMUNITY)
Admission: RE | Admit: 2020-09-14 | Discharge: 2020-09-14 | Disposition: A | Discharge status: Home / Self Care | Payer: PRIVATE HEALTH INSURANCE | Source: Ambulatory Visit | Attending: Obstetrics & Gynecology | Admitting: Obstetrics & Gynecology

## 2020-09-14 VITALS — BP 116/78 | HR 90 | Wt 302.1 lb

## 2020-09-14 DIAGNOSIS — Z01419 Encounter for gynecological examination (general) (routine) without abnormal findings: Secondary | ICD-10-CM

## 2020-09-14 DIAGNOSIS — Z3009 Encounter for other general counseling and advice on contraception: Secondary | ICD-10-CM

## 2020-09-14 MED ORDER — NORETHIN ACE-ETH ESTRAD-FE 1-20 MG-MCG PO TABS: 1.0000 | ORAL_TABLET | Freq: Every day | ORAL | 4 refills | Status: DC

## 2020-09-14 NOTE — Progress Notes (Signed)
Subjective:     Tina Figueroa is a 40 y.o. female here for a routine exam.  Current complaints: no new GYN complaints. Pt was marreid since she was last seen. She also had her gallbladder removed. She has no desire to have children now or in the future. She is on OCPs and is happy with that method of contraception.   Gynecologic History Patient's last menstrual period was 08/15/2020. Contraception: OCP (estrogen/progesterone) Last Pap: 01/03/2017. Results were: normal Last mammogram: 08/20/2020. Results were: normal  Obstetric History OB History  Gravida Para Term Preterm AB Living  0 0 0 0 0 0  SAB IAB Ectopic Multiple Live Births  0 0 0 0     The following portions of the patient's history were reviewed and updated as appropriate: allergies, current medications, past family history, past medical history, past social history, past surgical history and problem list.  Review of Systems Pertinent items are noted in HPI.    Objective:  BP 116/78   Pulse 90   Wt (!) 302 lb 1.9 oz (137 kg)   LMP 08/15/2020   BMI 51.06 kg/m   General Appearance:    Alert, cooperative, no distress, appears stated age  Head:    Normocephalic, without obvious abnormality, atraumatic  Eyes:    conjunctiva/corneas clear, EOM's intact, both eyes  Ears:    Normal external ear canals, both ears  Nose:   Nares normal, septum midline, mucosa normal, no drainage    or sinus tenderness  Throat:   Lips, mucosa, and tongue normal; teeth and gums normal  Neck:   Supple, symmetrical, trachea midline, no adenopathy;    thyroid:  no enlargement/tenderness/nodules  Back:     Symmetric, no curvature, ROM normal, no CVA tenderness  Lungs:     respirations unlabored  Chest Wall:    No tenderness or deformity   Heart:    Regular rate and rhythm  Breast Exam:    No tenderness, masses, or nipple abnormality  Abdomen:     Soft, non-tender, bowel sounds active all four quadrants,    no masses, no organomegaly  Genitalia:     Normal female without lesion, discharge or tenderness     Extremities:   Extremities normal, atraumatic, no cyanosis or edema  Pulses:   2+ and symmetric all extremities  Skin:   Skin color, texture, turgor normal, no rashes or lesions   Assessment:    Healthy female exam.   Contraception counseling- will say on OCPs. Breast cancer screening. Pt had a recent mammogram    Plan:   Diagnoses and all orders for this visit:  Well female exam with routine gynecological exam  Encounter for counseling regarding contraception  f/u in 1 year or sooner prn   Tina Figueroa L. Harraway-Smith, M.D., [facility]

## 2020-09-15 LAB — CYTOLOGY - PAP
Adequacy: ABSENT
Comment: NEGATIVE
Diagnosis: NEGATIVE
High risk HPV: NEGATIVE

## 2020-09-20 ENCOUNTER — Ambulatory Visit (INDEPENDENT_AMBULATORY_CARE_PROVIDER_SITE_OTHER): Payer: PRIVATE HEALTH INSURANCE | Admitting: Gastroenterology

## 2020-09-20 ENCOUNTER — Encounter: Payer: Self-pay | Admitting: Gastroenterology

## 2020-09-20 VITALS — BP 140/84 | HR 88 | Ht 64.5 in | Wt 303.2 lb

## 2020-09-20 DIAGNOSIS — R7989 Other specified abnormal findings of blood chemistry: Secondary | ICD-10-CM

## 2020-09-20 DIAGNOSIS — Z9889 Other specified postprocedural states: Secondary | ICD-10-CM | POA: Diagnosis not present

## 2020-09-20 DIAGNOSIS — R945 Abnormal results of liver function studies: Secondary | ICD-10-CM

## 2020-09-20 DIAGNOSIS — Z9049 Acquired absence of other specified parts of digestive tract: Secondary | ICD-10-CM

## 2020-09-20 NOTE — Progress Notes (Signed)
his 

## 2020-09-20 NOTE — Patient Instructions (Signed)
You will be due for a recall colonoscopy in 2026. We will send you a reminder in the mail when it gets closer to that time.  If you are age 42 or older, your body mass index should be between 23-30. Your Body mass index is 51.24 kg/m. If this is out of the aforementioned range listed, please consider follow up with your Primary Care Provider.  If you are age 54 or younger, your body mass index should be between 19-25. Your Body mass index is 51.24 kg/m. If this is out of the aformentioned range listed, please consider follow up with your Primary Care Provider.    Thank you for choosing me and [facility].  Dr. Meridee Score

## 2020-09-23 NOTE — Progress Notes (Signed)
[place] VISIT   Primary Care Provider [name], NP [address] [place] [address] [phone]  Patient Profile: Tina Figueroa is a 40 y.o. female with a pmh significant for HTN, HLD, OSA, PCOS, s/p CCK (for choledocholithiasis requiring ERCP).  The patient presents to the [facility] for an evaluation and management of problem(s) noted below:  Problem List 1. History of ERCP   2. History of cholecystectomy   3. Abnormal LFTs     History of Present Illness This is a patient that I met in the hospital after she presented with symptomatic cholelithiasis underwent cholecystectomy but then found to have choledocholithiasis and then a failed ERCP occurred.  I was asked to reattempt and after fistulotomy was successful at biliary cannulation and stone extraction.  Patient had an uneventful rest of her hospitalization was discharged.  She has had liver tests checked a few weeks after her discharge and they have normalized.  Today, the patient states that she is doing very well.  She still has some discomfort at the regions of her scar sites but overall is much improved.  She is back to eating her normal diet but trying to stay as healthy as possible.  Patient denies any significant changes in her bowel habits.  She feels that issues of constipation are not an issue currently but she is having between 1 and 2 formed bowel movements daily without any diarrhea.  No blood in her stools.  She is followed up with her surgeon and has been discharged at this time.  GI Review of Systems Positive as above Negative for pyrosis, dysphagia, odynophagia, nausea, vomiting, change in bowel habits, melena, hematochezia  Review of Systems General: Denies fevers/chills/weight loss unintentionally Cardiovascular: Denies chest pain Pulmonary: Denies shortness of breath Gastroenterological: See HPI Genitourinary: Denies darkened  urine Hematological: Denies easy bruising/bleeding Dermatological: Denies jaundice Psychological: Mood is stable   Medications Current Outpatient Medications  Medication Sig Dispense Refill  . acetaminophen (TYLENOL) 500 MG tablet Take 2 tablets (1,000 mg total) by mouth every 6 (six) hours as needed for headache, mild pain or fever. 30 tablet 0  . meloxicam (MOBIC) 7.5 MG tablet TAKE 1 TABLET DAILY 90 tablet 3  . metoprolol succinate (TOPROL-XL) 100 MG 24 hr tablet Take 1 tablet (100 mg total) by mouth daily. Take with or immediately following a meal. 90 tablet 1  . montelukast (SINGULAIR) 10 MG tablet Take 10 mg by mouth at bedtime.    . norethindrone-ethinyl estradiol (JUNEL FE 1/20) 1-20 MG-MCG tablet Take 1 tablet by mouth daily. 3 tablet 4   Current Facility-Administered Medications  Medication Dose Route Frequency Provider Last Rate Last Admin  . cyanocobalamin ((VITAMIN B-12)) injection 1,000 mcg  1,000 mcg Intramuscular Q30 days [name], NP   1,000 mcg at 09/02/20 0488    Allergies Allergies  Allergen Reactions  . Flagyl [Metronidazole Hcl]     Chalky and coated tongue  . Metformin And Related Other (See Comments)    Nausea, diarrhea  . Spironolactone     Increased menstrual bleeding.    Histories Past Medical History:  Diagnosis Date  . B12 deficiency 04/09/2016  . Choledocholithiasis   . Elevated blood pressure reading without diagnosis of hypertension   . History of chicken pox   . Hypertension   . Mild hyperlipidemia 03/18/2012  . OSA (obstructive sleep apnea) 03/25/2012   Severe OSA per sleep study 10/13.   Marland Kitchen Palpitations   .  PCOS (polycystic ovarian syndrome)    Past Surgical History:  Procedure Laterality Date  . ADENOIDECTOMY  1989  . BIOPSY  07/18/2020   Procedure: BIOPSY;  Surgeon: Rush Landmark Telford Nab., MD;  Location[facility];  Service: Gastroenterology;;  . Lorin Mercy N/A 07/19/2020   Procedure: LAPAROSCOPIC CHOLECYSTECTOMY  with intraoperative cholangiogram;  Surgeon: Johnathan Hausen, MD;  Location: [facility];  Service: General;  Laterality: N/A;  . DILATION AND CURETTAGE OF UTERUS    . DILATION AND CURETTAGE OF UTERUS    . ERCP N/A 07/15/2020   Procedure: ENDOSCOPIC RETROGRADE CHOLANGIOPANCREATOGRAPHY (ERCP);  Surgeon: Irene Shipper, MD;  Location: [facility];  Service: Endoscopy;  Laterality: N/A;  . ERCP N/A 07/18/2020   Procedure: ENDOSCOPIC RETROGRADE CHOLANGIOPANCREATOGRAPHY (ERCP);  Surgeon: Irving Copas., MD;  Location: [facility];  Service: Gastroenterology;  Laterality: N/A;  . REMOVAL OF STONES  07/18/2020   Procedure: REMOVAL OF STONES;  Surgeon: Rush Landmark Telford Nab., MD;  Location: [facility];  Service: Gastroenterology;;  . Joan Mayans  07/18/2020   Procedure: Joan Mayans;  Surgeon: Irving Copas., MD;  Location: [facility];  Service: Gastroenterology;;  . TONSILLECTOMY    . WISDOM TOOTH EXTRACTION     Social History   Socioeconomic History  . Marital status: Married    Spouse name: Not on file  . Number of children: 0  . Years of education: Not on file  . Highest education level: Not on file  Occupational History  . Occupation: Control and instrumentation engineer: [employer]  Tobacco Use  . Smoking status: Never Smoker  . Smokeless tobacco: Never Used  Vaping Use  . Vaping Use: Never used  Substance and Sexual Activity  . Alcohol use: Yes    Comment: Rarely  . Drug use: No  . Sexual activity: Yes    Partners: Male  Other Topics Concern  . 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 Heart disease Mother        CHF  . Hypertension Mother   . Depression Mother   . Asthma Mother   . Polycystic ovary syndrome Mother   . Arthritis Father   . Cancer Other        aunt  . Diabetes Maternal Aunt   . Arthritis Maternal Uncle   . Diabetes Paternal Uncle   . Arthritis Maternal Grandmother   . Cancer Maternal Grandmother        lung  . Arthritis Paternal Grandmother   . Cancer Paternal Grandmother        breast  . Stroke Paternal Grandmother   . Diabetes Paternal Grandmother   . Colon cancer Paternal Grandfather 25  . Prostate cancer Paternal Grandfather   . Cancer Paternal Aunt 71       breast  . Esophageal  cancer Neg Hx   . Inflammatory bowel disease Neg Hx   . Liver disease Neg Hx   . Pancreatic cancer Neg Hx   . Stomach cancer Neg Hx    I have reviewed her medical, social, and family history in detail and updated the electronic medical record as necessary.    PHYSICAL EXAMINATION  BP 140/84   Pulse 88   Ht 5' 4.5" (1.638 m)   Wt (!) 303 lb 3.2 oz (137.5 kg)   BMI 51.24 kg/m  Wt Readings from Last 3 Encounters:  09/20/20 (!) 303 lb 3.2 oz (137.5 kg)  09/14/20 (!) 302 lb 1.9 oz (137 kg)  09/02/20 (!) 301 lb (136.5 kg)  GEN: NAD, appears stated age, doesn't appear chronically ill PSYCH: Cooperative, without pressured speech EYE: Conjunctivae pink, sclerae anicteric ENT: Masked CV: Nontachycardic RESP: No audible wheezing GI: NABS, soft, protuberant abdomen, rounded, mild tenderness to palpation in the right upper quadrant at region of scars, no rebound or guarding, unable to appreciate hepatosplenomegaly due to body habitus MSK/EXT: Lower extremity edema noted SKIN: No jaundice NEURO:  Alert & Oriented x 3, no focal deficits   REVIEW  OF DATA  I reviewed the following data at the time of this encounter:  GI Procedures and Studies  1/22 ERCP - J-shaped deformity of the stomach noted. Gastritis - biopsied for HP. - No gross lesions in the duodenal bulb, in the first portion of the duodenum and in the second portion of the duodenum. - The major papilla appeared to be bulging and edematous. - Difficult cannulation requiring eventual use of precut fistulotomy and eventual extension of overall sphincterotomy. - A filling defect consistent with a stone was seen on the cholangiogram. - The entire main bile duct was moderately dilated, with a stone causing an obstruction. - Two stones, choledocholithiasis, were found. Complete removal was accomplished by biliary sphincterotomy/fistulotomy and balloon sweep.  Laboratory Studies  Reviewed those in epic  Imaging Studies  No relevant studies to review   ASSESSMENT  Ms. Athens is a 40 y.o. female with a pmh significant for HTN, HLD, OSA, PCOS, s/p CCK (for choledocholithiasis requiring ERCP).  The patient is seen today for evaluation and management of:  1. History of ERCP   2. History of cholecystectomy   3. Abnormal LFTs    The patient is clinically and hemodynamically stable.  She has done well since our repeat ERCP leading to stone extraction.  Liver tests have normalized.  Minimal abdominal discomfort at the region of the scar sites which does not feel like the discomfort that caused her to come into the hospital.  She is doing well.  Although there is a paternal grandparent with history of colon cancer she does not meet criteria for increased risk colon cancer screening.  She will begin colon cancer screening at age 13 unless she has changes in bowel habits or blood in her stools.  I asked her to be on the look out for changes in her bowels as result of her recent cholecystectomy although she seems to be doing well at this time.  All patient questions were answered to the best of  my ability, and the patient agrees to the aforementioned plan of action with follow-up as indicated.   PLAN  Follow-up as needed Screening colonoscopy recommended at age 38 Monitor stools for changes now that she is status post cholecystectomy for any signs of bile salt diarrhea in future Focusing on weight loss will be important to  decrease risk of fatty liver issues in the future   No orders of the defined types were placed in this encounter.   New Prescriptions   No medications on file   Modified Medications   No medications on file    Planned Follow Up No follow-ups on file.   Total Time in Face-to-Face and in Coordination of Care for patient including independent/personal interpretation/review of prior testing, medical history, examination, medication adjustment, communicating results with the patient directly, and documentation with the EHR is 20 minutes.   Justice Britain, MD [facility] Office # [phone]

## 2020-09-24 ENCOUNTER — Encounter: Payer: Self-pay | Admitting: Gastroenterology

## 2020-09-24 DIAGNOSIS — Z9889 Other specified postprocedural states: Secondary | ICD-10-CM | POA: Insufficient documentation

## 2020-09-24 DIAGNOSIS — R945 Abnormal results of liver function studies: Secondary | ICD-10-CM | POA: Insufficient documentation

## 2020-09-24 DIAGNOSIS — Z9049 Acquired absence of other specified parts of digestive tract: Secondary | ICD-10-CM

## 2020-09-24 DIAGNOSIS — R7989 Other specified abnormal findings of blood chemistry: Secondary | ICD-10-CM | POA: Insufficient documentation

## 2020-09-24 HISTORY — DX: Other specified postprocedural states: Z98.890

## 2020-09-24 HISTORY — DX: Acquired absence of other specified parts of digestive tract: Z90.49

## 2020-10-05 ENCOUNTER — Ambulatory Visit: Payer: PRIVATE HEALTH INSURANCE | Admitting: Family

## 2020-10-10 ENCOUNTER — Ambulatory Visit: Payer: PRIVATE HEALTH INSURANCE | Admitting: Family

## 2020-10-10 ENCOUNTER — Other Ambulatory Visit: Payer: Self-pay

## 2020-10-10 VITALS — BP 132/73 | HR 87 | Temp 98.4°F | Resp 16 | Ht 64.5 in | Wt 305.0 lb

## 2020-10-10 DIAGNOSIS — E538 Deficiency of other specified B group vitamins: Secondary | ICD-10-CM | POA: Diagnosis not present

## 2020-10-10 DIAGNOSIS — I10 Essential (primary) hypertension: Secondary | ICD-10-CM

## 2020-10-10 MED ORDER — CYANOCOBALAMIN 1000 MCG/ML IJ SOLN
1000.0000 ug | Freq: Once | INTRAMUSCULAR | Status: AC
  Administered 2020-10-10: 1000 ug via INTRAMUSCULAR

## 2020-10-10 NOTE — Progress Notes (Addendum)
Subjective:   By signing my name below, I, [name], attest that this documentation has been prepared under the direction and in the presence of Sandford Craze, NP. 10/10/2020     Patient ID: Tina Figueroa, female    DOB: May 18, 1981, 40 y.o.   MRN: [id number]  No chief complaint on file.   HPI Patient is in today for a office visit. She is doing well at this time.  Hypertension-  Last visit her blood pressure was elevated. Her blood pressure has improved since the last visit. Last visit we increased metoprolol from 50mg  to 100mg .  BP Readings from Last 3 Encounters:  10/10/20 132/73  09/20/20 140/84  09/14/20 116/78     Past Medical History:  Diagnosis Date  . B12 deficiency 04/09/2016  . Choledocholithiasis   . Elevated blood pressure reading without diagnosis of hypertension   . History of chicken pox   . Hypertension   . Mild hyperlipidemia 03/18/2012  . OSA (obstructive sleep apnea) 03/25/2012   Severe OSA per sleep study 10/13.   05/25/2012 Palpitations   . PCOS (polycystic ovarian syndrome)     Past Surgical History:  Procedure Laterality Date  . ADENOIDECTOMY  1989  . BIOPSY  07/18/2020   Procedure: BIOPSY;  Surgeon: Marland Kitchen 07/20/2020., MD;  Location: [facility];  Service: Gastroenterology;;  . Netty Starring N/A 07/19/2020   Procedure: LAPAROSCOPIC CHOLECYSTECTOMY with intraoperative cholangiogram;  Surgeon: Virgil Callas, MD;  Location: [facility];  Service: General;  Laterality: N/A;  . DILATION AND CURETTAGE OF UTERUS    . DILATION AND CURETTAGE OF UTERUS    . ERCP N/A 07/15/2020   Procedure: ENDOSCOPIC RETROGRADE CHOLANGIOPANCREATOGRAPHY (ERCP);  Surgeon: Luretha Murphy, MD;  Location: 07/17/2020 [facility];  Service: Endoscopy;  Laterality: N/A;  . ERCP N/A 07/18/2020   Procedure: ENDOSCOPIC RETROGRADE CHOLANGIOPANCREATOGRAPHY (ERCP);  Surgeon: [facility] MD;  Location: 07/20/2020 ENDOSCOPY;  Service: Gastroenterology;  Laterality: N/A;  . REMOVAL OF STONES   07/18/2020   Procedure: REMOVAL OF STONES;  Surgeon: [facility] 07/20/2020., MD;  Location: [facility];  Service: Gastroenterology;;  . Netty Starring  07/18/2020   Procedure: Dennison Mascot;  Surgeon: 07/20/2020., MD;  Location: [facility];  Service: Gastroenterology;;  . TONSILLECTOMY    . WISDOM TOOTH EXTRACTION      Family History  Problem Relation Age of Onset  . Heart disease Mother        CHF  . Hypertension Mother   . Depression Mother   . Asthma Mother   . Polycystic ovary syndrome Mother   . Arthritis Father   . Cancer Other        aunt  . Diabetes Maternal Aunt   . Arthritis Maternal Uncle   . Diabetes Paternal Uncle   . Arthritis Maternal Grandmother   . Cancer Maternal Grandmother        lung  . Arthritis Paternal Grandmother   . Cancer Paternal Grandmother        breast  . Stroke Paternal Grandmother   . Diabetes Paternal Grandmother   . Colon cancer Paternal Grandfather 70  . Prostate cancer Paternal Grandfather   . Cancer Paternal Aunt 64       breast  . Esophageal cancer Neg Hx   . Inflammatory bowel disease Neg Hx   . Liver disease Neg Hx   . Pancreatic cancer Neg Hx   . Stomach cancer Neg Hx     Social History   Socioeconomic History  . Marital status:  Married    Spouse name: Not on file  . Number of children: 0  . Years of education: Not on file  . Highest education level: Not on file  Occupational History  . Occupation: Chartered certified accountant: [employer]  Tobacco Use  . Smoking status: Never Smoker  . Smokeless tobacco: Never Used  Vaping Use  . Vaping Use: Never used  Substance and Sexual Activity  . Alcohol use: Yes    Comment: Rarely  . Drug use: No  . Sexual activity: Yes    Partners: Male  Other Topics Concern  . 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 acetaminophen (TYLENOL) 500 MG tablet Take 2 tablets (1,000 mg total) by mouth every 6 (six) hours as needed for headache, mild pain or fever. 30 tablet 0  . meloxicam (MOBIC) 7.5 MG tablet TAKE 1 TABLET DAILY 90 tablet 3  . metoprolol succinate (TOPROL-XL) 100 MG 24 hr tablet Take 1 tablet (100 mg total) by mouth daily. Take with or immediately following a meal. 90 tablet 1  . montelukast (SINGULAIR) 10 MG tablet Take 10 mg by mouth at bedtime.    . norethindrone-ethinyl estradiol (JUNEL FE 1/20) 1-20 MG-MCG tablet Take 1 tablet by mouth daily. 3 tablet 4   Facility-Administered Medications Prior to Visit  Medication Dose Route Frequency Provider Last Rate Last Admin  . cyanocobalamin ((VITAMIN B-12)) injection 1,000 mcg  1,000 mcg Intramuscular Q30 days Sandford Craze, NP   1,000 mcg at 09/02/20 1937    Allergies  Allergen Reactions  . Flagyl [Metronidazole Hcl]     Chalky and coated tongue  . Metformin And Related Other (See Comments)    Nausea, diarrhea  . Spironolactone     Increased menstrual bleeding.    ROS     Objective:    Physical Exam Constitutional:      Appearance: She is well-developed.  Neck:     Thyroid: No thyromegaly.  Cardiovascular:     Rate and Rhythm: Normal rate and regular rhythm.     Pulses: Normal pulses.     Heart sounds: Normal heart sounds. No murmur heard.   Pulmonary:     Effort: Pulmonary effort is normal. No respiratory distress.     Breath sounds: Normal breath sounds. No  wheezing.  Musculoskeletal:     Cervical back: Neck supple.  Skin:    General: Skin is warm and dry.  Neurological:     Mental Status: She is alert and oriented to person, place, and time.  Psychiatric:        Behavior: Behavior normal.        Thought Content: Thought content normal.        Judgment: Judgment normal.     There  were no vitals taken for this visit. Wt Readings from Last 3 Encounters:  09/20/20 (!) 303 lb 3.2 oz (137.5 kg)  09/14/20 (!) 302 lb 1.9 oz (137 kg)  09/02/20 (!) 301 lb (136.5 kg)    Diabetic Foot Exam - Simple   No data filed    Lab Results  Component Value Date   WBC 13.3 (H) 07/20/2020   HGB 12.8 07/20/2020   HCT 40.0 07/20/2020   PLT 326 07/20/2020   GLUCOSE 73 09/02/2020   CHOL 203 (H) 09/02/2020   TRIG 74.0 09/02/2020   HDL 46.30 09/02/2020   LDLCALC 142 (H) 09/02/2020   ALT 31 09/02/2020   AST 23 09/02/2020   NA 140 09/02/2020   K 4.4 09/02/2020   CL 107 09/02/2020   CREATININE 0.75 09/02/2020   BUN 7 09/02/2020   CO2 24 09/02/2020   TSH 2.07 05/20/2019   INR 1.1 07/14/2020   HGBA1C 4.6 09/02/2020    Lab Results  Component Value Date   TSH 2.07 05/20/2019   Lab Results  Component Value Date   WBC 13.3 (H) 07/20/2020   HGB 12.8 07/20/2020   HCT 40.0 07/20/2020   MCV 100.5 (H) 07/20/2020   PLT 326 07/20/2020   Lab Results  Component Value Date   NA 140 09/02/2020   K 4.4 09/02/2020   CO2 24 09/02/2020   GLUCOSE 73 09/02/2020   BUN 7 09/02/2020   CREATININE 0.75 09/02/2020   BILITOT 0.7 09/02/2020   ALKPHOS 87 09/02/2020   AST 23 09/02/2020   ALT 31 09/02/2020   PROT 6.7 09/02/2020   ALBUMIN 3.6 09/02/2020   CALCIUM 9.2 09/02/2020   ANIONGAP 9 07/20/2020   GFR 99.01 09/02/2020   Lab Results  Component Value Date   CHOL 203 (H) 09/02/2020   Lab Results  Component Value Date   HDL 46.30 09/02/2020   Lab Results  Component Value Date   LDLCALC 142 (H) 09/02/2020   Lab Results  Component Value Date    TRIG 74.0 09/02/2020   Lab Results  Component Value Date   CHOLHDL 4 09/02/2020   Lab Results  Component Value Date   HGBA1C 4.6 09/02/2020       Assessment & Plan:   Problem List Items Addressed This Visit   None      No orders of the defined types were placed in this encounter.    I,Shehryar Garment/textile technologist as a Neurosurgeon for [employer], NP.,have documented all relevant documentation on the behalf of [name], NP,as directed by  [name], NP while in the presence of [name], NP.   [name]   I, [name], NP, have reviewed all documentation for this visit. The documentation on 10/14/20 for the exam, diagnosis, procedures, and orders are all accurate and complete.

## 2020-10-10 NOTE — Assessment & Plan Note (Signed)
BP is stable/improved. Continue metoprolol 100mg . HR is also stable.

## 2020-10-12 NOTE — Progress Notes (Signed)
10/13/20- 39 yoF never smoker for sleep evaluation courtesy of Sandford Craze, NP with concern of OSA Medical problem list includes HTN, GERD, Fatty Liver, PCOS, Metabolic Syndrome,  HST 04/02/12- AHI 34.1/ hr, desaturation to 85%, body weight 264 lbs Saw Dr Vassie Loll in 2013. CPAP auto 5-12. Then referred to Orthodontics for OAP.but too expensive. Epworth score-2 Body weight today-306 lbs Covid vax-2 Moderna Flu vax- had Her original experience with CPAP uncomfortable, bothered by airflow. Husband complains of her loud snoring and witnessed apneas. She minimizes daytime sleepiness. Little caffeine. ENT+ tonsillectomy, Cardiac+ palpitations. Suspects her mother had OSA.  Prior to Admission medications   Medication Sig Start Date End Date Taking? Authorizing Provider  acetaminophen (TYLENOL) 500 MG tablet Take 2 tablets (1,000 mg total) by mouth every 6 (six) hours as needed for headache, mild pain or fever. 07/20/20   Meuth, Lina Sar, PA-C  meloxicam (MOBIC) 7.5 MG tablet TAKE 1 TABLET DAILY 08/30/20   Sandford Craze, NP  metoprolol succinate (TOPROL-XL) 100 MG 24 hr tablet Take 1 tablet (100 mg total) by mouth daily. Take with or immediately following a meal. 09/02/20   Sandford Craze, NP  montelukast (SINGULAIR) 10 MG tablet Take 10 mg by mouth at bedtime.    [provider]  norethindrone-ethinyl estradiol (JUNEL FE 1/20) 1-20 MG-MCG tablet Take 1 tablet by mouth daily. 09/14/20   Willodean Rosenthal, MD   Past Medical History:  Diagnosis Date   B12 deficiency 04/09/2016   Choledocholithiasis    Elevated blood pressure reading without diagnosis of hypertension    History of chicken pox    Hypertension    Mild hyperlipidemia 03/18/2012   OSA (obstructive sleep apnea) 03/25/2012   Severe OSA per sleep study 10/13.    Palpitations    PCOS (polycystic ovarian syndrome)    Past Surgical History:  Procedure Laterality Date   ADENOIDECTOMY  1989   BIOPSY  07/18/2020    Procedure: BIOPSY;  Surgeon: [facility] Netty Starring., MD;  Location: [facility];  Service: Gastroenterology;;   CHOLECYSTECTOMY N/A 07/19/2020   Procedure: LAPAROSCOPIC CHOLECYSTECTOMY with intraoperative cholangiogram;  Surgeon: Luretha Murphy, MD;  Location: [facility];  Service: General;  Laterality: N/A;   DILATION AND CURETTAGE OF UTERUS     DILATION AND CURETTAGE OF UTERUS     ERCP N/A 07/15/2020   Procedure: ENDOSCOPIC RETROGRADE CHOLANGIOPANCREATOGRAPHY (ERCP);  Surgeon: Hilarie Fredrickson, MD;  Location: [facility];  Service: Endoscopy;  Laterality: N/A;   ERCP N/A 07/18/2020   Procedure: ENDOSCOPIC RETROGRADE CHOLANGIOPANCREATOGRAPHY (ERCP);  Surgeon: Lemar Lofty., MD;  Location: [facility];  Service: Gastroenterology;  Laterality: N/A;   REMOVAL OF STONES  07/18/2020   Procedure: REMOVAL OF STONES;  Surgeon: [facility] Netty Starring., MD;  Location: [facility];  Service: Gastroenterology;;   Dennison Mascot  07/18/2020   Procedure: Dennison Mascot;  Surgeon: Mansouraty, Netty Starring., MD;  Location: [facility];  Service: Gastroenterology;;   TONSILLECTOMY     WISDOM TOOTH EXTRACTION     Family History  Problem Relation Age of Onset   Heart disease Mother        CHF   Hypertension Mother    Depression Mother    Asthma Mother    Polycystic ovary syndrome Mother    Arthritis Father    Cancer Other        aunt   Diabetes Maternal Aunt    Arthritis Maternal Uncle    Diabetes Paternal Uncle    Arthritis Maternal Grandmother    Cancer Maternal Grandmother  lung   Arthritis Paternal Grandmother    Cancer Paternal Grandmother        breast   Stroke Paternal Grandmother    Diabetes Paternal Grandmother    Colon cancer Paternal Grandfather 18   Prostate cancer Paternal Grandfather    Cancer Paternal Aunt 71       breast   Esophageal cancer Neg Hx    Inflammatory bowel disease Neg Hx    Liver disease Neg Hx    Pancreatic cancer Neg Hx    Stomach cancer Neg Hx     Social History   Socioeconomic History   Marital status: Married    Spouse name: Not on file   Number of children: 0   Years of education: Not on file   Highest education level: Not on file  Occupational History   Occupation: Chartered certified accountant: [employer]  Tobacco Use   Smoking status: Never   Smokeless tobacco: Never  Vaping Use   Vaping Use: Never used  Substance and Sexual Activity   Alcohol use: Yes    Comment: Rarely   Drug use: No   Sexual activity: Yes    Partners: Male  Other Topics Concern   Not on file  Social History Narrative   Regular exercise;  No   Caffeine Use:  1-2 daily   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 HEENT:    headaches, difficulty swallowing, tooth/dental problems, sore throat,       sneezing, itching, ear ache, nasal congestion, post nasal drip, snoring CV:    chest pain, orthopnea, PND, swelling in lower extremities, anasarca,                                   dizziness, palpitations Resp:   +shortness of breath with exertion or at rest.                productive cough,   non-productive cough, coughing up of blood.              change in color of mucus.  wheezing.   Skin:    rash or lesions. GI:  No-   heartburn, indigestion, abdominal pain, nausea, vomiting, diarrhea,                 change in bowel habits, loss of appetite GU: dysuria, change in color of urine, no urgency or  frequency.   flank pain. MS:   joint pain, stiffness, decreased range of motion, back pain. Neuro-     nothing unusual Psych:  change in mood or affect.  depression or anxiety.   memory loss.  OBJ- Physical Exam General- Alert, Oriented, Affect-appropriate, Distress- none acute, + obese, Skin- rash-none, lesions- none, excoriation- none Lymphadenopathy- none Head- atraumatic  Eyes- Gross vision intact, PERRLA, conjunctivae and secretions clear            Ears- Hearing, canals-normal            Nose- Clear, no-Septal dev, mucus, polyps, erosion, perforation             Throat- Mallampati IV , mucosa clear , drainage- none, tonsils absent, + teeth Neck- flexible , trachea midline, no stridor , thyroid nl, carotid no bruit Chest - symmetrical excursion , unlabored           Heart/CV- RRR , no murmur , no gallop  , no rub, nl s1 s2                           - JVD- none , edema- none, stasis changes- none, varices- none           Lung- clear to P&A, wheeze- none, cough- none , dullness-none, rub- none           Chest wall-  Abd-  Br/ Gen/ Rectal- Not done, not indicated Extrem- cyanosis- none, clubbing, none, atrophy- none, strength- nl Neuro- grossly intact to observation

## 2020-10-13 ENCOUNTER — Ambulatory Visit (INDEPENDENT_AMBULATORY_CARE_PROVIDER_SITE_OTHER): Payer: PRIVATE HEALTH INSURANCE | Admitting: Internal Medicine

## 2020-10-13 ENCOUNTER — Other Ambulatory Visit: Payer: Self-pay

## 2020-10-13 ENCOUNTER — Encounter: Payer: Self-pay | Admitting: Internal Medicine

## 2020-10-13 VITALS — BP 138/88 | HR 102 | Temp 97.4°F | Resp 20 | Ht 64.0 in | Wt 306.0 lb

## 2020-10-13 DIAGNOSIS — G4733 Obstructive sleep apnea (adult) (pediatric): Secondary | ICD-10-CM | POA: Diagnosis not present

## 2020-10-13 NOTE — Patient Instructions (Signed)
Order- schedule home sleep test   Dx OSA  Please call us about 2 weeks after your sleep test for results and recommendations.  As discussed, it would work out well if you could use CPAP while you work on your weight to get into a range where other treatments could be used.

## 2020-11-03 ENCOUNTER — Encounter: Payer: Self-pay | Admitting: Internal Medicine

## 2020-11-09 ENCOUNTER — Ambulatory Visit (INDEPENDENT_AMBULATORY_CARE_PROVIDER_SITE_OTHER): Payer: PRIVATE HEALTH INSURANCE

## 2020-11-09 ENCOUNTER — Other Ambulatory Visit: Payer: Self-pay

## 2020-11-09 DIAGNOSIS — E538 Deficiency of other specified B group vitamins: Secondary | ICD-10-CM

## 2020-11-09 MED ORDER — CYANOCOBALAMIN 1000 MCG/ML IJ SOLN
1000.0000 ug | Freq: Once | INTRAMUSCULAR | Status: AC
  Administered 2020-11-09: 1000 ug via INTRAMUSCULAR

## 2020-11-09 NOTE — Progress Notes (Signed)
Tina Figueroa is a 40 y.o. female presents to the office today for B12 injection injections, per physician's orders. Last order to continue monthly B12 shots on ov note from 06-03-2020. cyanocobnalamin (med), 1000 mg/ml (dose),  IM (route) was administered on left deltoid (location) today. Patient tolerated injection. Patient due for follow up labs/provider appt:   Patient next injection due: in one month, appt made for June.  Wilford Corner

## 2020-12-02 ENCOUNTER — Encounter: Payer: Self-pay | Admitting: Internal Medicine

## 2020-12-02 NOTE — Assessment & Plan Note (Signed)
Emphasized diet, exercise with life style change She may do best with Healthy Weight and Wellness then Bariatric Surgery consult

## 2020-12-02 NOTE — Assessment & Plan Note (Signed)
We discussed options, but nothing is likely appropriate for her execpt PAP. Discussed comfort Plan- update sleep study , then likely CPAP or BIPAP

## 2020-12-08 ENCOUNTER — Other Ambulatory Visit: Payer: Self-pay

## 2020-12-08 ENCOUNTER — Ambulatory Visit (INDEPENDENT_AMBULATORY_CARE_PROVIDER_SITE_OTHER): Payer: PRIVATE HEALTH INSURANCE

## 2020-12-08 DIAGNOSIS — E538 Deficiency of other specified B group vitamins: Secondary | ICD-10-CM | POA: Diagnosis not present

## 2020-12-08 MED ORDER — CYANOCOBALAMIN 1000 MCG/ML IJ SOLN
1000.0000 ug | Freq: Once | INTRAMUSCULAR | Status: AC
  Administered 2020-12-08: 1000 ug via INTRAMUSCULAR

## 2020-12-08 NOTE — Progress Notes (Signed)
Tina Figueroa is a 40 y.o. female presents to the office today for B12 injection injections, per physician's orders. Last order to continue monthly B12 shots on ov note from 06-03-2020. cyanocobnalamin (med), 1000 mg/ml (dose),  IM (route) was administered on left deltoid (location) today. Patient tolerated injection. Patient due for follow up labs/provider appt:   Patient next injection due: in one month, appt made for July.

## 2021-01-11 ENCOUNTER — Ambulatory Visit: Payer: PRIVATE HEALTH INSURANCE

## 2021-01-11 ENCOUNTER — Ambulatory Visit (INDEPENDENT_AMBULATORY_CARE_PROVIDER_SITE_OTHER): Payer: PRIVATE HEALTH INSURANCE

## 2021-01-11 ENCOUNTER — Other Ambulatory Visit: Payer: Self-pay

## 2021-01-11 DIAGNOSIS — E538 Deficiency of other specified B group vitamins: Secondary | ICD-10-CM

## 2021-01-11 NOTE — Progress Notes (Signed)
Pt here for monthly B12 injection per   B12 given left IM, and pt tolerated injection well.  Next B12 injection scheduled for 1 month

## 2021-02-07 ENCOUNTER — Ambulatory Visit (INDEPENDENT_AMBULATORY_CARE_PROVIDER_SITE_OTHER): Payer: PRIVATE HEALTH INSURANCE

## 2021-02-07 ENCOUNTER — Other Ambulatory Visit: Payer: Self-pay

## 2021-02-07 DIAGNOSIS — E538 Deficiency of other specified B group vitamins: Secondary | ICD-10-CM | POA: Diagnosis not present

## 2021-02-07 MED ORDER — CYANOCOBALAMIN 1000 MCG/ML IJ SOLN
1000.0000 ug | Freq: Once | INTRAMUSCULAR | Status: AC
  Administered 2021-02-07: 1000 ug via INTRAMUSCULAR

## 2021-02-07 NOTE — Progress Notes (Signed)
Tina Figueroa is a 41 y.o. female presents to the office today for monthly b12 injections, per physician's orders.  cyanocobalamin (med), 1,000 mcg (dose),  IM (route) was administered right deltoid (location) today. Patient tolerated injection. Patient next injection due: one month, appt made Yes  [name]

## 2021-02-13 ENCOUNTER — Other Ambulatory Visit: Payer: Self-pay | Admitting: Family

## 2021-02-13 ENCOUNTER — Ambulatory Visit: Payer: PRIVATE HEALTH INSURANCE | Admitting: Internal Medicine

## 2021-02-14 ENCOUNTER — Ambulatory Visit: Payer: PRIVATE HEALTH INSURANCE

## 2021-02-16 ENCOUNTER — Encounter: Payer: Self-pay | Admitting: Family

## 2021-02-17 NOTE — Addendum Note (Signed)
Addended by: Sandford Craze on: 02/17/2021 11:56 AM   Modules accepted: Orders

## 2021-02-28 ENCOUNTER — Encounter: Payer: Self-pay | Admitting: Family

## 2021-03-10 ENCOUNTER — Ambulatory Visit: Payer: PRIVATE HEALTH INSURANCE

## 2021-03-14 ENCOUNTER — Other Ambulatory Visit: Payer: Self-pay

## 2021-03-14 ENCOUNTER — Ambulatory Visit (INDEPENDENT_AMBULATORY_CARE_PROVIDER_SITE_OTHER): Payer: Managed Care, Other (non HMO)

## 2021-03-14 ENCOUNTER — Other Ambulatory Visit: Payer: PRIVATE HEALTH INSURANCE

## 2021-03-14 DIAGNOSIS — Z23 Encounter for immunization: Secondary | ICD-10-CM | POA: Diagnosis not present

## 2021-03-14 DIAGNOSIS — E538 Deficiency of other specified B group vitamins: Secondary | ICD-10-CM

## 2021-03-14 MED ORDER — CYANOCOBALAMIN 1000 MCG/ML IJ SOLN
1000.0000 ug | Freq: Once | INTRAMUSCULAR | Status: AC
  Administered 2021-03-14: 1000 ug via INTRAMUSCULAR

## 2021-03-14 NOTE — Progress Notes (Addendum)
Pt has come to the office for monthly B12 Shot on LD.   Ordered by Sandford Craze.   B12 labs ordered today.  Flu shot given today RD.   Pt tolerated injections and lab draw well.   Reviewed.  Sandford Craze NP

## 2021-03-15 LAB — VITAMIN B12: Vitamin B-12: 403 pg/mL (ref 211–911)

## 2021-04-02 ENCOUNTER — Encounter: Payer: Self-pay | Admitting: Family

## 2021-04-03 ENCOUNTER — Other Ambulatory Visit: Payer: Self-pay

## 2021-04-03 DIAGNOSIS — Z3009 Encounter for other general counseling and advice on contraception: Secondary | ICD-10-CM

## 2021-04-03 MED ORDER — NORETHIN ACE-ETH ESTRAD-FE 1-20 MG-MCG PO TABS: 1.0000 | ORAL_TABLET | Freq: Every day | ORAL | 4 refills | Status: DC

## 2021-04-03 MED ORDER — MELOXICAM 7.5 MG PO TABS: 7.5000 mg | ORAL_TABLET | Freq: Every day | ORAL | 1 refills | Status: DC

## 2021-04-03 MED ORDER — METOPROLOL SUCCINATE ER 100 MG PO TB24: ORAL_TABLET | ORAL | 1 refills | Status: DC

## 2021-04-10 ENCOUNTER — Ambulatory Visit (INDEPENDENT_AMBULATORY_CARE_PROVIDER_SITE_OTHER): Payer: Managed Care, Other (non HMO) | Admitting: Family

## 2021-04-10 ENCOUNTER — Encounter: Payer: Self-pay | Admitting: Family

## 2021-04-10 ENCOUNTER — Other Ambulatory Visit: Payer: Self-pay

## 2021-04-10 VITALS — BP 135/87 | HR 97 | Temp 98.0°F | Resp 16 | Wt 310.0 lb

## 2021-04-10 DIAGNOSIS — E538 Deficiency of other specified B group vitamins: Secondary | ICD-10-CM

## 2021-04-10 DIAGNOSIS — I10 Essential (primary) hypertension: Secondary | ICD-10-CM

## 2021-04-10 DIAGNOSIS — J309 Allergic rhinitis, unspecified: Secondary | ICD-10-CM | POA: Diagnosis not present

## 2021-04-10 LAB — COMPREHENSIVE METABOLIC PANEL
ALT: 11 U/L (ref 0–35)
AST: 12 U/L (ref 0–37)
Albumin: 3.9 g/dL (ref 3.5–5.2)
Alkaline Phosphatase: 93 U/L (ref 39–117)
BUN: 10 mg/dL (ref 6–23)
CO2: 23 mEq/L (ref 19–32)
Calcium: 9.1 mg/dL (ref 8.4–10.5)
Chloride: 107 mEq/L (ref 96–112)
Creatinine, Ser: 0.96 mg/dL (ref 0.40–1.20)
GFR: 73.32 mL/min (ref >60.00)
Glucose, Bld: 97 mg/dL (ref 70–99)
Potassium: 4 mEq/L (ref 3.5–5.1)
Sodium: 138 mEq/L (ref 135–145)
Total Bilirubin: 0.6 mg/dL (ref 0.2–1.2)
Total Protein: 7 g/dL (ref 6.0–8.3)

## 2021-04-10 MED ORDER — CYANOCOBALAMIN 1000 MCG/ML IJ SOLN
1000.0000 ug | Freq: Once | INTRAMUSCULAR | Status: AC
  Administered 2021-04-10: 1000 ug via INTRAMUSCULAR

## 2021-04-10 NOTE — Patient Instructions (Signed)
Please complete lab work prior to leaving.   

## 2021-04-10 NOTE — Progress Notes (Signed)
Subjective:   By signing my name below, I, Tina Figueroa, attest that this documentation has been prepared under the direction and in the presence of Tina Alar NP. 04/10/2021    Patient ID: Tina Figueroa, female    DOB: 22-Dec-1980, 40 y.o.   MRN: [id number]  Chief Complaint  Patient presents with   Hypertension    Here for follow up   B12 deficiency    Here for B12 shot    Hypertension  Patient is in today for a office visit.  Skin- She complains of a itchy dry patch of skin on her upper right arm. She notes that is where she received her Covid-19 vaccines and vitamin B12 injections.  B12 deficiency- continues monthly b12 injections. HTN- maintained on toprol xl 154m.     BP Readings from Last 3 Encounters:  10/13/20 138/88  10/10/20 132/73  09/20/20 140/84   Allergies- She reports stopping taking Singulair to manage her allergies and switched to OTC Zyrtec and found relief temporarily.  Feet pain- She continues taking 7.5 mg meloxicam daily PO to manage her feet pain. She reports her pain worsens when she is walking on her feet most of the day. OSA- following with Dr. YAnnamaria Boots Maintained on CPAP.  Immunizations- She is UTD on flu vaccines. She is interested in getting her 2nd Covid-19 booster vaccine. She was also informed of the new bivalent Covid-19 vaccine.  Weight- She has gained weight since her last visit. She is planning on getting a nutritionist to manage her diet.    Health Maintenance Due  Topic Date Due   Hepatitis C Screening  Never done   COVID-19 Vaccine (3 - Booster for Moderna series) 04/14/2020    Past Medical History:  Diagnosis Date   B12 deficiency 04/09/2016   Choledocholithiasis    Elevated blood pressure reading without diagnosis of hypertension    History of chicken pox    Hypertension    Mild hyperlipidemia 03/18/2012   OSA (obstructive sleep apnea) 03/25/2012   Severe OSA per sleep study 10/13.    Palpitations    PCOS (polycystic  ovarian syndrome)     Past Surgical History:  Procedure Laterality Date   ADENOIDECTOMY  1989   BIOPSY  07/18/2020   Procedure: BIOPSY;  Surgeon: MRush LandmarkGTelford Nab, MD;  [facility];  Service: Gastroenterology;;   CHOLECYSTECTOMY N/A 07/19/2020   Procedure: LAPAROSCOPIC CHOLECYSTECTOMY with intraoperative cholangiogram;  Surgeon: MJohnathan Hausen MD;  Location: [facility];  Service: General;  Laterality: N/A;   DILATION AND CURETTAGE OF UTERUS     DILATION AND CURETTAGE OF UTERUS     ERCP N/A 07/15/2020   Procedure: ENDOSCOPIC RETROGRADE CHOLANGIOPANCREATOGRAPHY (ERCP);  Surgeon: PIrene Shipper MD;  [facility];  Service: Endoscopy;  Laterality: N/A;   ERCP N/A 07/18/2020   Procedure: ENDOSCOPIC RETROGRADE CHOLANGIOPANCREATOGRAPHY (ERCP);  Surgeon: MIrving Copas, MD;  [facility];  Service: Gastroenterology;  Laterality: N/A;   REMOVAL OF STONES  07/18/2020   Procedure: REMOVAL OF STONES;  Surgeon: MRush LandmarkGTelford Nab, MD;  [facility];  Service: Gastroenterology;;   SJoan Mayans 07/18/2020   Procedure: SJoan Mayans  Surgeon: Mansouraty, GTelford Nab, MD;  [facility];  Service: Gastroenterology;;   TONSILLECTOMY     WISDOM TOOTH EXTRACTION      Family History  Problem Relation Age of Onset   Heart disease Mother        CHF   Hypertension Mother    Depression Mother  Asthma Mother    Polycystic ovary syndrome Mother    Arthritis Father    Cancer Other        aunt   Diabetes Maternal Aunt    Arthritis Maternal Uncle    Diabetes Paternal Uncle    Arthritis Maternal Grandmother    Cancer Maternal Grandmother        lung   Arthritis Paternal Grandmother    Cancer Paternal Grandmother        breast   Stroke Paternal Grandmother    Diabetes Paternal Grandmother    Colon cancer Paternal Grandfather 13   Prostate cancer Paternal Grandfather    Cancer Paternal Aunt 34       breast   Esophageal cancer Neg  Hx    Inflammatory bowel disease Neg Hx    Liver disease Neg Hx    Pancreatic cancer Neg Hx    Stomach cancer Neg Hx     Social History   Socioeconomic History   Marital status: Married    Spouse name: Not on file   Number of children: 0   Years of education: Not on file   Highest education level: Not on file  Occupational History   Occupation: Control and instrumentation engineer: [employer]  Tobacco Use   Smoking status: Never   Smokeless tobacco: Never  Vaping Use   Vaping Use: Never used  Substance and Sexual Activity   Alcohol use: Yes    Comment: Rarely   Drug use: No   Sexual activity: Yes    Partners: Male  Other Topics Concern   Not on file  Social History Narrative   Regular exercise;  No   Caffeine Use:  1-2 daily   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   No children   Married   Some college   Plans to return to school in January for medical office administration   Lives with dad and husband and her cat      Social Determinants of Radio broadcast assistant Strain: Not on file  Food Insecurity: Not on file  Transportation Needs: Not on file  Physical Activity: Not on file  Stress: Not on file  Social Connections: Not on file  Intimate Partner Violence: Not on file    Outpatient Medications Prior to Visit  Medication Sig Dispense Refill   acetaminophen (TYLENOL) 500 MG tablet Take 2 tablets (1,000 mg total) by mouth every 6 (six) hours as needed for headache, mild pain or fever. 30 tablet 0   meloxicam (MOBIC) 7.5 MG tablet Take 1 tablet (7.5 mg total) by mouth daily. 90 tablet 1   metoprolol succinate (TOPROL-XL) 100 MG 24 hr tablet TAKE 1 TABLET DAILY. TAKE WITH OR IMMEDIATELY FOLLOWING A MEAL 90 tablet 1   norethindrone-ethinyl estradiol-FE (JUNEL FE 1/20) 1-20 MG-MCG tablet Take 1 tablet by mouth daily. 3 tablet 4   montelukast (SINGULAIR) 10 MG tablet Take 10 mg by mouth at bedtime.     Facility-Administered Medications Prior to  Visit  Medication Dose Route Frequency Provider Last Rate Last Admin   cyanocobalamin ((VITAMIN B-12)) injection 1,000 mcg  1,000 mcg Intramuscular Q30 days Tina Alar, NP   1,000 mcg at 01/11/21 1518    Allergies  Allergen Reactions   Flagyl [Metronidazole Hcl]     Chalky and coated tongue   Metformin And Related Other (See Comments)    Nausea, diarrhea   Spironolactone     Increased menstrual bleeding.  Review of Systems  Skin:        (+)dry itching patch of skin on upper right arm      Objective:    Physical Exam Constitutional:      General: She is not in acute distress.    Appearance: Normal appearance. She is not ill-appearing.  HENT:     Head: Normocephalic and atraumatic.     Right Ear: External ear normal.     Left Ear: External ear normal.  Eyes:     Extraocular Movements: Extraocular movements intact.     Pupils: Pupils are equal, round, and reactive to light.  Cardiovascular:     Rate and Rhythm: Normal rate and regular rhythm.     Heart sounds: Normal heart sounds. No murmur heard.   No gallop.  Pulmonary:     Effort: Pulmonary effort is normal. No respiratory distress.     Breath sounds: Normal breath sounds. No wheezing or rales.  Skin:    General: Skin is warm and dry.  Neurological:     Mental Status: She is alert and oriented to person, place, and time.  Psychiatric:        Behavior: Behavior normal.    BP 135/87 (BP Location: Right Arm, Patient Position: Sitting, Cuff Size: Large)   Pulse 97   Temp 98 F (36.7 C) (Oral)   Resp 16   Wt (!) 310 lb (140.6 kg)   SpO2 98%   BMI 53.21 kg/m  Wt Readings from Last 3 Encounters:  04/10/21 (!) 310 lb (140.6 kg)  10/13/20 (!) 306 lb (138.8 kg)  10/10/20 (!) 305 lb (138.3 kg)       Assessment & Plan:   Problem List Items Addressed This Visit       Unprioritized   HTN (hypertension) - Primary    BP Readings from Last 3 Encounters:  04/10/21 135/87  10/13/20 138/88  10/10/20  132/73  BP is acceptable. Continue toprol xl 141m once daily.       Relevant Orders   Comp Met (CMET)   B12 deficiency    Clinically stable. Continue b12 injections.        Allergic rhinitis    Uncontrolled. Resume zyrtec once daily, add flonase 2 sprays each nostril once daily.         Meds ordered this encounter  Medications   cyanocobalamin ((VITAMIN B-12)) injection 1,000 mcg     I, MDebbrah AlarNP, personally preformed the services described in this documentation.  All medical record entries made by the scribe were at my direction and in my presence.  I have reviewed the chart and discharge instructions (if applicable) and agree that the record reflects my personal performance and is accurate and complete. 04/10/2021   I,Tina Figueroa,acting as a sEducation administratorfor MNance Pear NP.,have documented all relevant documentation on the behalf of MNance Pear NP,as directed by  MNance Pear NP while in the presence of MNance Pear NP.   MNance Pear NP

## 2021-04-10 NOTE — Progress Notes (Deleted)
Subjective:     Patient ID: Tina Figueroa, female    DOB: 06/08/1981, 40 y.o.   MRN: [id number]  Chief Complaint  Patient presents with   Hypertension    Here for follow up   B12 deficiency    Here for B12 shot    HPI Patient is in today for follow up.   B12 deficiency- continues monthly b12 injections.  HTN- maintained on toprol xl 180m.  BP Readings from Last 3 Encounters:  04/10/21 135/87  10/13/20 138/88  10/10/20 132/73   OSA- following with Dr. YAnnamaria Figueroa Maintained on CPAP.   Health Maintenance Due  Topic Date Due   Hepatitis C Screening  Never done   COVID-19 Vaccine (3 - Booster for Moderna series) 04/14/2020    Past Medical History:  Diagnosis Date   B12 deficiency 04/09/2016   Choledocholithiasis    Elevated blood pressure reading without diagnosis of hypertension    History of chicken pox    Hypertension    Mild hyperlipidemia 03/18/2012   OSA (obstructive sleep apnea) 03/25/2012   Severe OSA per sleep study 10/13.    Palpitations    PCOS (polycystic ovarian syndrome)     Past Surgical History:  Procedure Laterality Date   ADENOIDECTOMY  1989   BIOPSY  07/18/2020   Procedure: BIOPSY;  Surgeon: Tina LandmarkGTelford Nab, MD;  [facility];  Service: Gastroenterology;;   CHOLECYSTECTOMY N/A 07/19/2020   Procedure: LAPAROSCOPIC CHOLECYSTECTOMY with intraoperative cholangiogram;  Surgeon: MJohnathan Hausen MD;  Location: [facility];  Service: General;  Laterality: N/A;   DILATION AND CURETTAGE OF UTERUS     DILATION AND CURETTAGE OF UTERUS     ERCP N/A 07/15/2020   Procedure: ENDOSCOPIC RETROGRADE CHOLANGIOPANCREATOGRAPHY (ERCP);  Surgeon: Tina Shipper MD;  [facility];  Service: Endoscopy;  Laterality: N/A;   ERCP N/A 07/18/2020   Procedure: ENDOSCOPIC RETROGRADE CHOLANGIOPANCREATOGRAPHY (ERCP);  Surgeon: Tina Copas, MD;  [facility];  Service: Gastroenterology;  Laterality: N/A;   REMOVAL OF STONES  07/18/2020    Procedure: REMOVAL OF STONES;  Surgeon: Tina LandmarkGTelford Nab, MD;  [facility];  Service: Gastroenterology;;   SJoan Mayans 07/18/2020   Procedure: SJoan Mayans  Surgeon: Tina Figueroa, Tina Nab, MD;  [facility];  Service: Gastroenterology;;   TONSILLECTOMY     WISDOM TOOTH EXTRACTION      Family History  Problem Relation Age of Onset   Heart disease Mother        CHF   Hypertension Mother    Depression Mother    Asthma Mother    Polycystic ovary syndrome Mother    Arthritis Father    Cancer Other        aunt   Diabetes Maternal Aunt    Arthritis Maternal Uncle    Diabetes Paternal Uncle    Arthritis Maternal Grandmother    Cancer Maternal Grandmother        lung   Arthritis Paternal Grandmother    Cancer Paternal Grandmother        breast   Stroke Paternal Grandmother    Diabetes Paternal Grandmother    Colon cancer Paternal Grandfather 541  Prostate cancer Paternal Grandfather    Cancer Paternal AAunt 72      breast   Esophageal cancer Neg Hx    Inflammatory bowel disease Neg Hx    Liver disease Neg Hx    Pancreatic cancer Neg Hx    Stomach cancer Neg Hx  Social History   Socioeconomic History   Marital status: Married    Spouse name: Not on file   Number of children: 0   Years of education: Not on file   Highest education level: Not on file  Occupational History   Occupation: Control and instrumentation engineer: [employer]  Tobacco Use   Smoking status: Never   Smokeless tobacco: Never  Vaping Use   Vaping Use: Never used  Substance and Sexual Activity   Alcohol use: Yes    Comment: Rarely   Drug use: No   Sexual activity: Yes    Partners: Male  Other Topics Concern   Not on file  Social History Narrative   Regular exercise;  No   Caffeine Use:  1-2 daily   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   No children   Married   Some college   Plans to return to school in January for medical office  administration   Lives with dad and husband and her cat      Social Determinants of Radio broadcast assistant Strain: Not on file  Food Insecurity: Not on file  Transportation Needs: Not on file  Physical Activity: Not on file  Stress: Not on file  Social Connections: Not on file  Intimate Partner Violence: Not on file    Outpatient Medications Prior to Visit  Medication Sig Dispense Refill   acetaminophen (TYLENOL) 500 MG tablet Take 2 tablets (1,000 mg total) by mouth every 6 (six) hours as needed for headache, mild pain or fever. 30 tablet 0   meloxicam (MOBIC) 7.5 MG tablet Take 1 tablet (7.5 mg total) by mouth daily. 90 tablet 1   metoprolol succinate (TOPROL-XL) 100 MG 24 hr tablet TAKE 1 TABLET DAILY. TAKE WITH OR IMMEDIATELY FOLLOWING A MEAL 90 tablet 1   norethindrone-ethinyl estradiol-FE (JUNEL FE 1/20) 1-20 MG-MCG tablet Take 1 tablet by mouth daily. 3 tablet 4   montelukast (SINGULAIR) 10 MG tablet Take 10 mg by mouth at bedtime.     Facility-Administered Medications Prior to Visit  Medication Dose Route Frequency Provider Last Rate Last Admin   cyanocobalamin ((VITAMIN B-12)) injection 1,000 mcg  1,000 mcg Intramuscular Q30 days Tina Alar, NP   1,000 mcg at 01/11/21 1518    Allergies  Allergen Reactions   Flagyl [Metronidazole Hcl]     Chalky and coated tongue   Metformin And Related Other (See Comments)    Nausea, diarrhea   Spironolactone     Increased menstrual bleeding.    ROS See HPI    Objective:    Physical Exam  BP 135/87 (BP Location: Right Arm, Patient Position: Sitting, Cuff Size: Large)   Pulse 97   Temp 98 F (36.7 C) (Oral)   Resp 16   Wt (!) 310 lb (140.6 kg)   SpO2 98%   BMI 53.21 kg/m  Wt Readings from Last 3 Encounters:  04/10/21 (!) 310 lb (140.6 kg)  10/13/20 (!) 306 lb (138.8 kg)  10/10/20 (!) 305 lb (138.3 kg)       Assessment & Plan:   Problem List Items Addressed This Visit       Unprioritized   HTN  (hypertension) - Primary   Relevant Orders   Comp Met (CMET)    I have discontinued Tina Figueroa. Tina Figueroa's montelukast. I am also having her maintain her acetaminophen, meloxicam, norethindrone-ethinyl estradiol-FE, and metoprolol succinate. We will continue to administer cyanocobalamin.  No orders of  the defined types were placed in this encounter.

## 2021-04-10 NOTE — Assessment & Plan Note (Signed)
Clinically stable. Continue b12 injections.

## 2021-04-10 NOTE — Assessment & Plan Note (Signed)
BP Readings from Last 3 Encounters:  04/10/21 135/87  10/13/20 138/88  10/10/20 132/73   BP is acceptable. Continue toprol xl 100mg  once daily.

## 2021-04-10 NOTE — Assessment & Plan Note (Signed)
Uncontrolled. Resume zyrtec once daily, add flonase 2 sprays each nostril once daily.

## 2021-04-11 ENCOUNTER — Ambulatory Visit: Payer: PRIVATE HEALTH INSURANCE | Admitting: Family

## 2021-05-04 ENCOUNTER — Ambulatory Visit (INDEPENDENT_AMBULATORY_CARE_PROVIDER_SITE_OTHER): Payer: Managed Care, Other (non HMO)

## 2021-05-04 ENCOUNTER — Other Ambulatory Visit: Payer: Self-pay

## 2021-05-04 DIAGNOSIS — E538 Deficiency of other specified B group vitamins: Secondary | ICD-10-CM | POA: Diagnosis not present

## 2021-05-04 MED ORDER — CYANOCOBALAMIN 1000 MCG/ML IJ SOLN
1000.0000 ug | Freq: Once | INTRAMUSCULAR | Status: AC
Start: 2021-05-04 — End: 2021-05-04
  Administered 2021-05-04: 13:00:00 1000 ug via INTRAMUSCULAR

## 2021-05-04 NOTE — Progress Notes (Signed)
Tina Figueroa is a 40 y.o. female presents to the office today for monthly B12:  injections, per physician's orders. Original order: per Sandford Craze FNP cyanocobalamin (med), 1000 mg/ml (dose),  IM (route) was administered left deltoid (location) today. Patient tolerated injection.  Patient next injection due: in one month, patient will call back to set up one month nv B12 shot.   [place]

## 2021-06-07 ENCOUNTER — Ambulatory Visit (INDEPENDENT_AMBULATORY_CARE_PROVIDER_SITE_OTHER): Payer: Managed Care, Other (non HMO)

## 2021-06-07 DIAGNOSIS — E538 Deficiency of other specified B group vitamins: Secondary | ICD-10-CM

## 2021-06-07 MED ORDER — CYANOCOBALAMIN 1000 MCG/ML IJ SOLN: 1000.0000 ug | Freq: Once | INTRAMUSCULAR | Status: DC

## 2021-06-07 NOTE — Progress Notes (Addendum)
Tina Figueroa is a 40 y.o. female presents to the office today for monthly B12:  injections, per physician's orders. Original order: per Sandford Craze FNP cyanocobalamin (med), 1000 mg/ml (dose),  IM (route) was administered right deltoid (location) today. Patient tolerated injection.

## 2021-07-06 ENCOUNTER — Encounter: Payer: Self-pay | Admitting: General Practice

## 2021-07-12 ENCOUNTER — Ambulatory Visit (INDEPENDENT_AMBULATORY_CARE_PROVIDER_SITE_OTHER): Payer: Managed Care, Other (non HMO)

## 2021-07-12 DIAGNOSIS — E538 Deficiency of other specified B group vitamins: Secondary | ICD-10-CM

## 2021-07-12 MED ORDER — CYANOCOBALAMIN 1000 MCG/ML IJ SOLN
1000.0000 ug | Freq: Once | INTRAMUSCULAR | Status: AC
  Administered 2021-07-12: 10:00:00 1000 ug via INTRAMUSCULAR

## 2021-07-12 NOTE — Progress Notes (Signed)
Tina Figueroa is a 41 y.o. female presents to the office today for monthly B12:  injections, per physician's orders. Original order: per Sandford Craze FNP cyanocobalamin (med), 1000 mg/ml (dose),  IM (route) was administered left deltoid (location) today. Patient tolerated injection

## 2021-08-11 ENCOUNTER — Ambulatory Visit (INDEPENDENT_AMBULATORY_CARE_PROVIDER_SITE_OTHER): Payer: Managed Care, Other (non HMO) | Admitting: *Deleted

## 2021-08-11 DIAGNOSIS — E538 Deficiency of other specified B group vitamins: Secondary | ICD-10-CM

## 2021-08-11 NOTE — Progress Notes (Signed)
Tina Figueroa is a 41 y.o. female presents to the office today for monthly B12 injections, per physician's orders.  Original order: per Sandford Craze FNP  cyanocobalamin, 1000 mg/ml IM was administered in right deltoid today.   Patient tolerated injection well.  Scheduled pt's next injection for March 24th at 0900.

## 2021-08-23 ENCOUNTER — Encounter: Payer: Self-pay | Admitting: Family

## 2021-08-23 ENCOUNTER — Other Ambulatory Visit: Payer: Self-pay

## 2021-08-23 DIAGNOSIS — Z3009 Encounter for other general counseling and advice on contraception: Secondary | ICD-10-CM

## 2021-08-23 MED ORDER — METOPROLOL SUCCINATE ER 100 MG PO TB24: ORAL_TABLET | ORAL | 1 refills | Status: DC

## 2021-08-23 MED ORDER — MELOXICAM 7.5 MG PO TABS: 7.5000 mg | ORAL_TABLET | Freq: Every day | ORAL | 1 refills | Status: DC

## 2021-08-23 MED ORDER — NORETHIN ACE-ETH ESTRAD-FE 1-20 MG-MCG PO TABS: 1.0000 | ORAL_TABLET | Freq: Every day | ORAL | 4 refills | Status: DC

## 2021-08-23 NOTE — Telephone Encounter (Signed)
Patient called back and she reports she received a prescription from a "doctor in [place] that she sees virtually for PCOS, the prescription is Metformin er 500 mg quid"  ?Patient was advised this may not be enough information to send rx.  ?

## 2021-08-30 ENCOUNTER — Encounter: Payer: Self-pay | Admitting: Family

## 2021-09-01 ENCOUNTER — Telehealth (INDEPENDENT_AMBULATORY_CARE_PROVIDER_SITE_OTHER): Payer: Managed Care, Other (non HMO) | Admitting: Family

## 2021-09-01 DIAGNOSIS — F4321 Adjustment disorder with depressed mood: Secondary | ICD-10-CM | POA: Diagnosis not present

## 2021-09-01 DIAGNOSIS — F4320 Adjustment disorder, unspecified: Secondary | ICD-10-CM

## 2021-09-01 HISTORY — DX: Adjustment disorder, unspecified: F43.20

## 2021-09-01 HISTORY — DX: Adjustment disorder with depressed mood: F43.21

## 2021-09-01 MED ORDER — HYDROXYZINE PAMOATE 25 MG PO CAPS: 25.0000 mg | ORAL_CAPSULE | Freq: Three times a day (TID) | ORAL | 1 refills | Status: DC | PRN
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
## 2021-09-01 NOTE — Assessment & Plan Note (Signed)
Patient is having a normal response to a stressful life occurrence, losing her cat and valuables and sentimental items. I recommended that she try to establish with a counselor and I gave her the number for [facility] to try to schedule an appointment.  In the meantime, I gave her an rx for Atarax to be used as needed for anxiety/insomnia.  Will plan to bring her back in about 1 month.  ?

## 2021-09-02 IMAGING — MG MM DIGITAL SCREENING BILAT W/ TOMO AND CAD
6 of 10 series · 6 of 30 positions shown · non-contrast
Comparison: Previous exam(s).

ACR Breast Density Category a: The breast tissue is almost entirely
fatty.

CLINICAL DATA: Screening.

EXAM:
DIGITAL SCREENING BILATERAL MAMMOGRAM WITH TOMOSYNTHESIS AND CAD
TECHNIQUE: Bilateral screening digital craniocaudal and mediolateral oblique
mammograms were obtained. Bilateral screening digital breast
tomosynthesis was performed. The images were evaluated with
computer-aided detection.

[L MLO synth-2D]
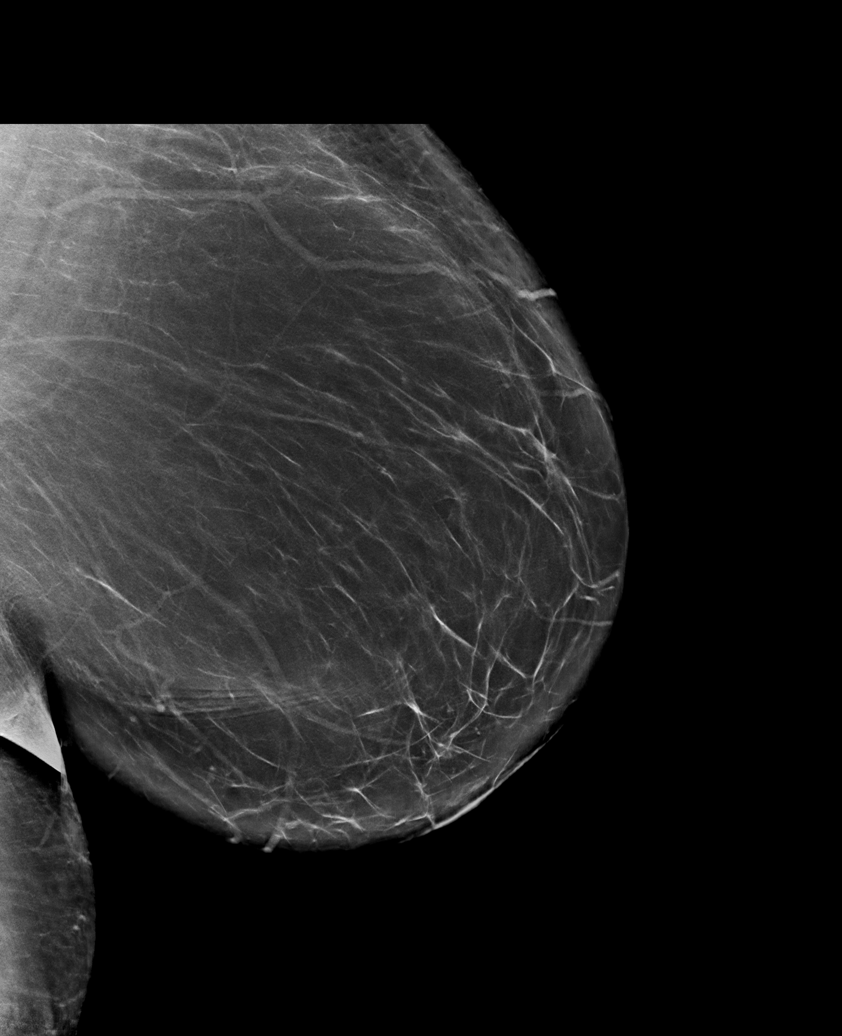

[R CV synth-2D]
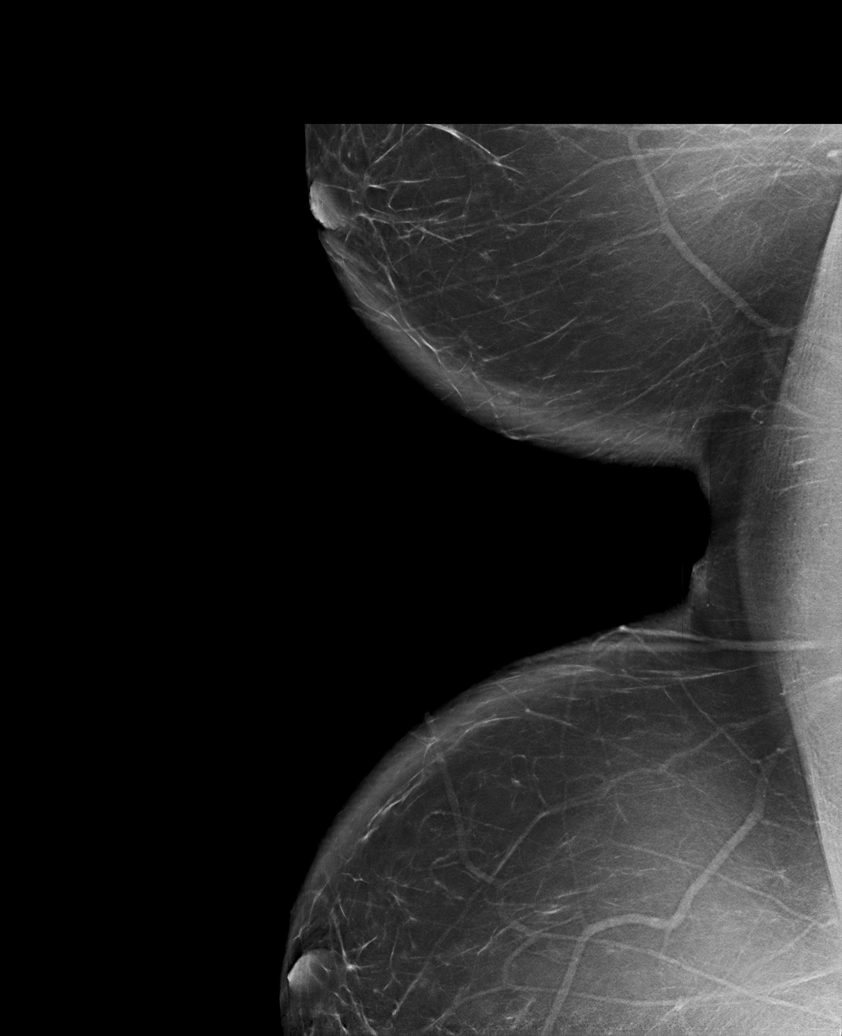

[L CC synth-2D]
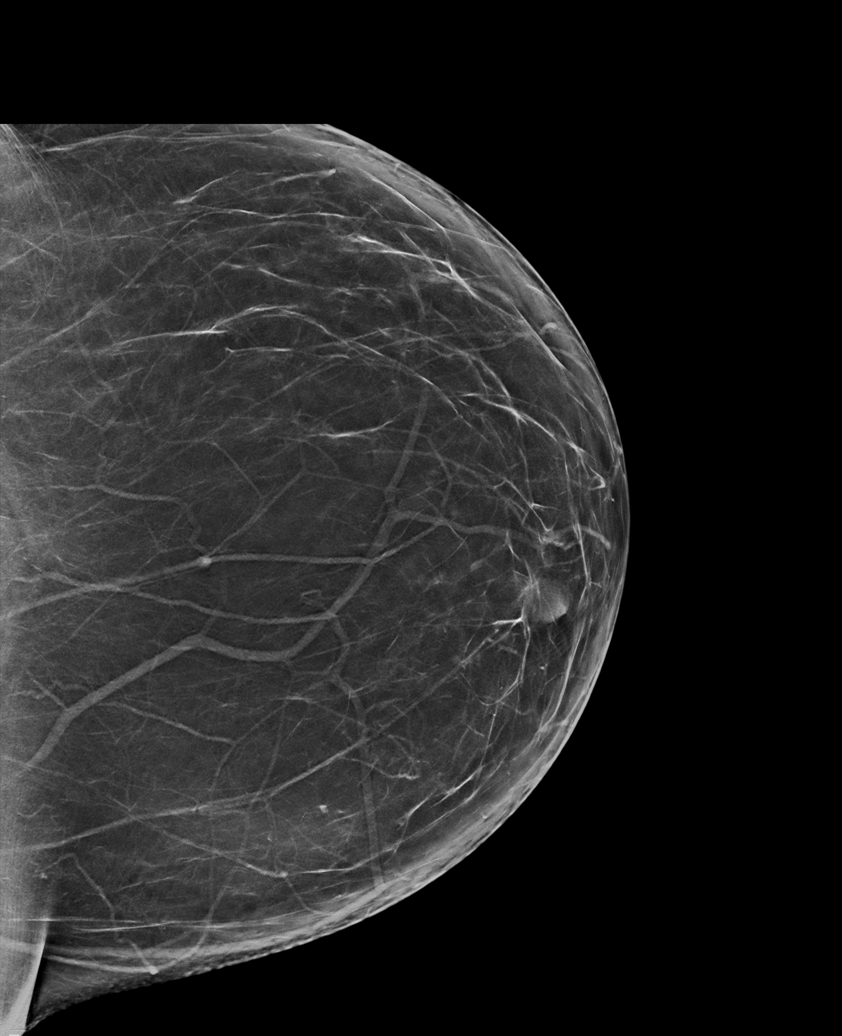

[R CC synth-2D]
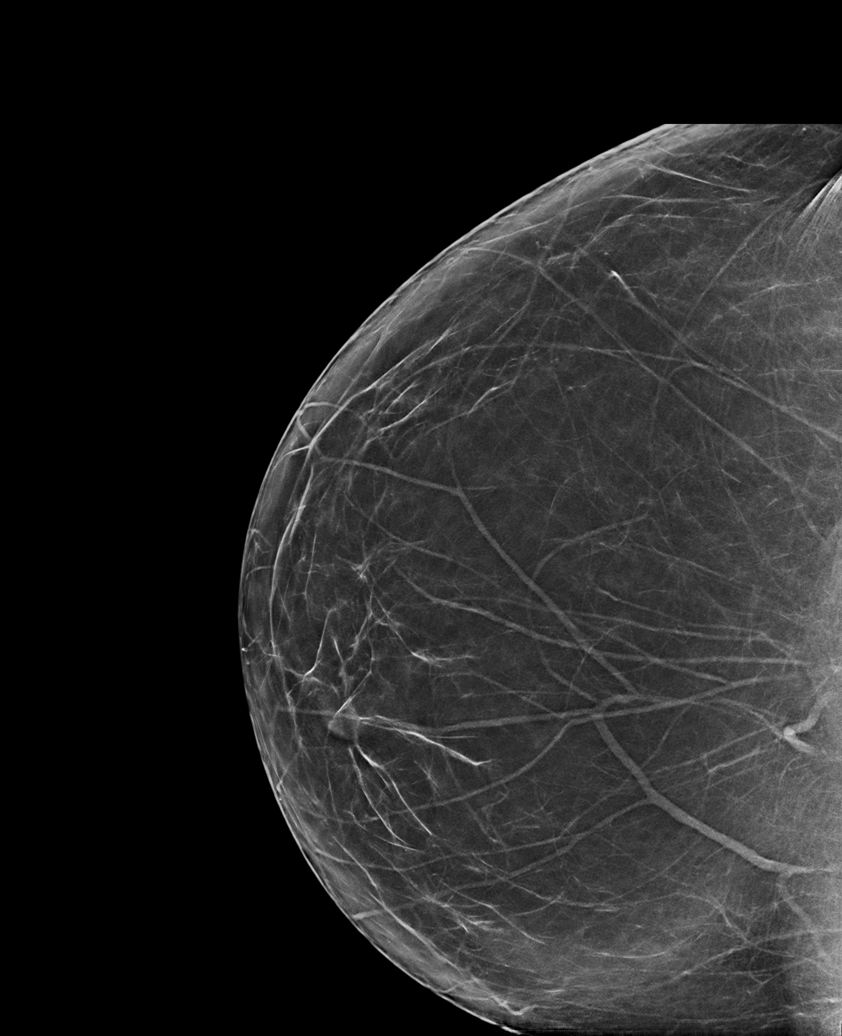

[R MLO synth-2D]
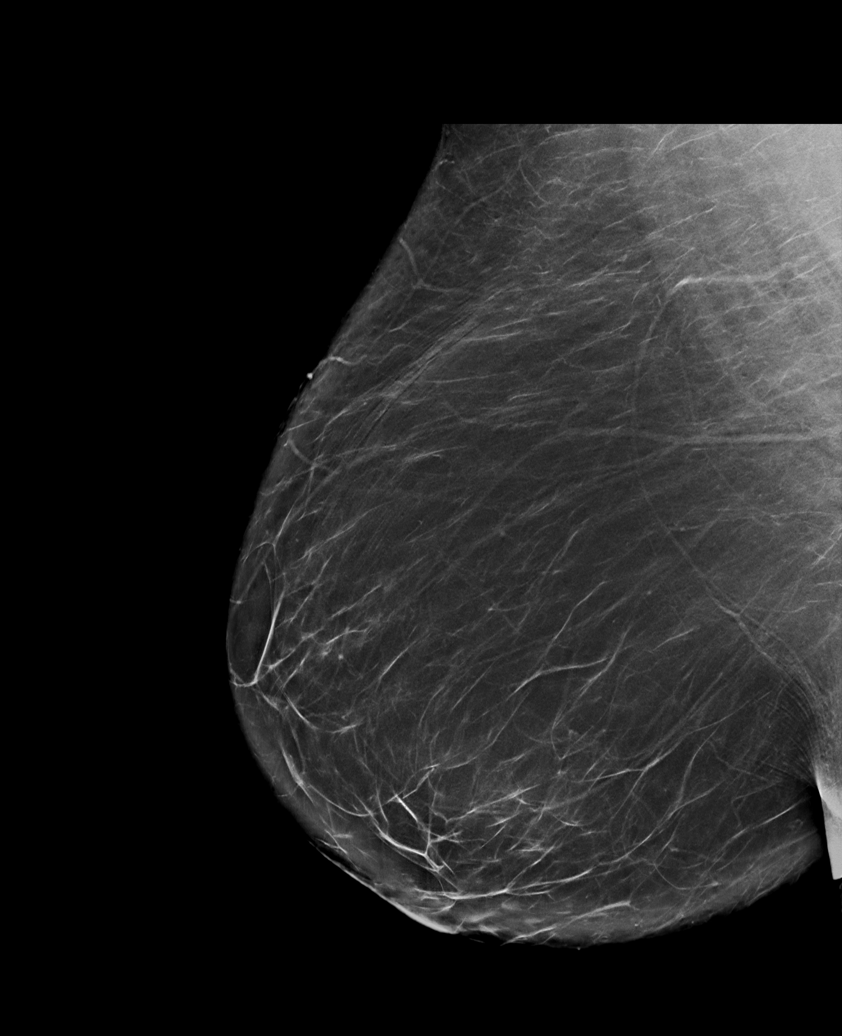

[L MLO tomo · tomo slice 49/98.0]
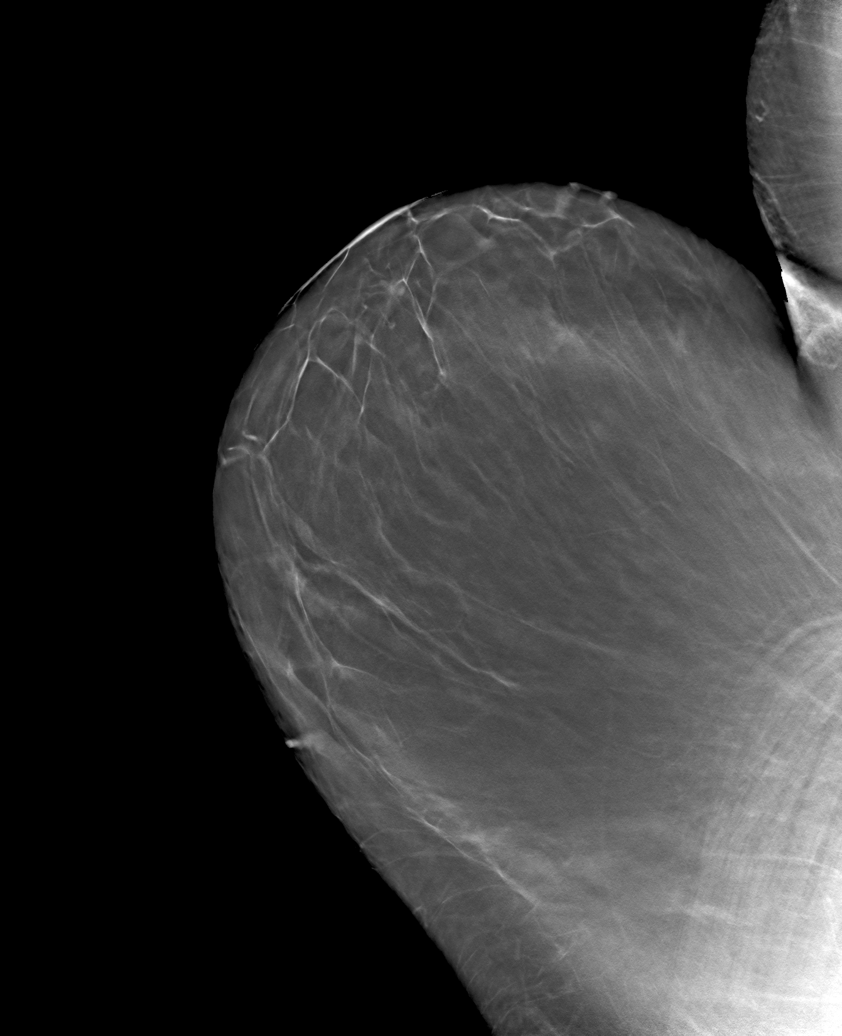

[6 of 30 positions shown; findings below may reference images not displayed]

FINDINGS: There are no findings suspicious for malignancy. The images were
evaluated with computer-aided detection.
IMPRESSION: No mammographic evidence of malignancy. A result letter of this
screening mammogram will be mailed directly to the patient.

RECOMMENDATION:
Screening mammogram in one year. (Code:JP-J-DD5)

BI-RADS CATEGORY  1: Negative.

## 2021-09-05 ENCOUNTER — Telehealth: Payer: Managed Care, Other (non HMO) | Admitting: Family

## 2021-09-08 ENCOUNTER — Ambulatory Visit (INDEPENDENT_AMBULATORY_CARE_PROVIDER_SITE_OTHER): Payer: Managed Care, Other (non HMO)

## 2021-09-08 DIAGNOSIS — E538 Deficiency of other specified B group vitamins: Secondary | ICD-10-CM

## 2021-09-08 MED ORDER — CYANOCOBALAMIN 1000 MCG/ML IJ SOLN
1000.0000 ug | Freq: Once | INTRAMUSCULAR | Status: AC
  Administered 2021-09-08: 1000 ug via INTRAMUSCULAR

## 2021-09-08 NOTE — Progress Notes (Signed)
Pt here for monthly B12 injection per [name] ? ?B12 1037mcg given IM, R deltoid and pt tolerated injection well. ? ?Next B12 injection scheduled for next month at CPE with PCP on 10/13/2021 ? ?

## 2021-09-18 ENCOUNTER — Encounter: Payer: Self-pay | Admitting: Family

## 2021-09-18 DIAGNOSIS — Z3009 Encounter for other general counseling and advice on contraception: Secondary | ICD-10-CM

## 2021-09-18 MED ORDER — NORETHIN ACE-ETH ESTRAD-FE 1-20 MG-MCG PO TABS: 1.0000 | ORAL_TABLET | Freq: Every day | ORAL | 3 refills | Status: DC

## 2021-09-20 ENCOUNTER — Other Ambulatory Visit (HOSPITAL_BASED_OUTPATIENT_CLINIC_OR_DEPARTMENT_OTHER): Payer: Self-pay | Admitting: Family

## 2021-09-20 DIAGNOSIS — Z1231 Encounter for screening mammogram for malignant neoplasm of breast: Secondary | ICD-10-CM

## 2021-09-27 ENCOUNTER — Ambulatory Visit (HOSPITAL_BASED_OUTPATIENT_CLINIC_OR_DEPARTMENT_OTHER): Payer: Managed Care, Other (non HMO)

## 2021-10-13 ENCOUNTER — Encounter: Payer: Self-pay | Admitting: Family

## 2021-10-13 ENCOUNTER — Ambulatory Visit (INDEPENDENT_AMBULATORY_CARE_PROVIDER_SITE_OTHER): Payer: Managed Care, Other (non HMO) | Admitting: Family

## 2021-10-13 VITALS — BP 136/85 | HR 105 | Temp 98.6°F | Ht 64.0 in | Wt 309.0 lb

## 2021-10-13 DIAGNOSIS — E785 Hyperlipidemia, unspecified: Secondary | ICD-10-CM | POA: Diagnosis not present

## 2021-10-13 DIAGNOSIS — E282 Polycystic ovarian syndrome: Secondary | ICD-10-CM

## 2021-10-13 DIAGNOSIS — Z0000 Encounter for general adult medical examination without abnormal findings: Secondary | ICD-10-CM | POA: Diagnosis not present

## 2021-10-13 DIAGNOSIS — E538 Deficiency of other specified B group vitamins: Secondary | ICD-10-CM | POA: Diagnosis not present

## 2021-10-13 DIAGNOSIS — R739 Hyperglycemia, unspecified: Secondary | ICD-10-CM | POA: Diagnosis not present

## 2021-10-13 DIAGNOSIS — Z23 Encounter for immunization: Secondary | ICD-10-CM

## 2021-10-13 DIAGNOSIS — L309 Dermatitis, unspecified: Secondary | ICD-10-CM

## 2021-10-13 LAB — BASIC METABOLIC PANEL
BUN: 11 mg/dL (ref 6–23)
CO2: 23 mEq/L (ref 19–32)
Calcium: 8.9 mg/dL (ref 8.4–10.5)
Chloride: 106 mEq/L (ref 96–112)
Creatinine, Ser: 0.74 mg/dL (ref 0.40–1.20)
GFR: 99.84 mL/min (ref >60.00)
Glucose, Bld: 92 mg/dL (ref 70–99)
Potassium: 3.7 mEq/L (ref 3.5–5.1)
Sodium: 138 mEq/L (ref 135–145)

## 2021-10-13 LAB — LIPID PANEL
Cholesterol: 210 mg/dL — ABNORMAL HIGH (ref 0–200)
HDL: 55.7 mg/dL (ref >39.00)
LDL Cholesterol: 138 mg/dL — ABNORMAL HIGH (ref 0–99)
NonHDL: 154.08
Total CHOL/HDL Ratio: 4
Triglycerides: 79 mg/dL (ref 0.0–149.0)
VLDL: 15.8 mg/dL (ref 0.0–40.0)

## 2021-10-13 LAB — HEMOGLOBIN A1C: Hgb A1c MFr Bld: 4.7 % (ref 4.6–6.5)

## 2021-10-13 MED ORDER — BETAMETHASONE DIPROPIONATE 0.05 % EX CREA: TOPICAL_CREAM | Freq: Two times a day (BID) | CUTANEOUS | 2 refills | Status: AC

## 2021-10-13 NOTE — Assessment & Plan Note (Signed)
b12 shot today. 

## 2021-10-13 NOTE — Assessment & Plan Note (Signed)
Has had good respons in the past with steroid cream, rx sent for betamethasone.  ?
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
## 2021-10-13 NOTE — Assessment & Plan Note (Signed)
Requesting referral to endocrinology
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
## 2021-10-13 NOTE — Patient Instructions (Signed)
Please complete lab work prior to leaving.  ?Continue your work on healthy diet, exercise and weight loss. ?Schedule mammogram on the first floor.  ?

## 2021-10-13 NOTE — Addendum Note (Signed)
Addended by: Lisbeth Renshaw, Waymond Meador HUA on: 10/13/2021 07:40 AM ? ? Modules accepted: Orders ? ?

## 2021-11-15 ENCOUNTER — Ambulatory Visit (INDEPENDENT_AMBULATORY_CARE_PROVIDER_SITE_OTHER): Payer: Managed Care, Other (non HMO)

## 2021-11-15 DIAGNOSIS — E538 Deficiency of other specified B group vitamins: Secondary | ICD-10-CM | POA: Diagnosis not present

## 2021-11-15 NOTE — Progress Notes (Signed)
Pt here for monthly B12 injection per [name]   B12 1052mcg given IM, L deltoid and pt tolerated injection well.   Next B12 injection scheduled for June 28th at 9am.

## 2021-11-21 ENCOUNTER — Other Ambulatory Visit: Payer: Self-pay | Admitting: Family

## 2021-12-13 ENCOUNTER — Ambulatory Visit: Payer: Managed Care, Other (non HMO)

## 2021-12-15 ENCOUNTER — Ambulatory Visit (INDEPENDENT_AMBULATORY_CARE_PROVIDER_SITE_OTHER): Payer: Managed Care, Other (non HMO)

## 2021-12-15 DIAGNOSIS — E538 Deficiency of other specified B group vitamins: Secondary | ICD-10-CM | POA: Diagnosis not present

## 2021-12-15 NOTE — Progress Notes (Signed)
Tina Figueroa is a 41 y.o. female presents to the office today for monthly B12 injection per physician's orders. Original order: Sandford Craze, NP on 03/29/21: "Clinically stable. Continue b12 injections." B12 given IM was administered L deltoid today. Patient tolerated injection. Patient due for follow up labs/provider appt: No.  Patient next injection due: 1 month, appt made Yes  [name], Melton Alar L

## 2022-01-17 ENCOUNTER — Ambulatory Visit (INDEPENDENT_AMBULATORY_CARE_PROVIDER_SITE_OTHER): Payer: Managed Care, Other (non HMO)

## 2022-01-17 DIAGNOSIS — E538 Deficiency of other specified B group vitamins: Secondary | ICD-10-CM

## 2022-01-17 NOTE — Progress Notes (Signed)
Tina Figueroa is a 41 y.o. female  presents to the office today for a B12 injection, per physician's orders. Original order: per Sandford Craze, NP  cyanocobalamin, 1000 mg/ml IM was administered in L deltoid today.   Patient tolerated injection well.   Next appointment:  02/16/22

## 2022-02-12 ENCOUNTER — Other Ambulatory Visit: Payer: Self-pay | Admitting: Family

## 2022-02-16 ENCOUNTER — Ambulatory Visit (INDEPENDENT_AMBULATORY_CARE_PROVIDER_SITE_OTHER): Payer: Managed Care, Other (non HMO)

## 2022-02-16 ENCOUNTER — Ambulatory Visit: Payer: Managed Care, Other (non HMO)

## 2022-02-16 DIAGNOSIS — E538 Deficiency of other specified B group vitamins: Secondary | ICD-10-CM

## 2022-02-16 NOTE — Progress Notes (Signed)
Tina Figueroa is a 40 y.o. female presents to the office today for Monthly B12 injection, per physician's orders. Original order: 03/29/21: "Clinically stable. Continue b12 injections." B12 1000mcg given IM was administered R  Deltoid (location) today. Patient tolerated injection. Patient due for follow up labs/provider appt: No.  Patient next injection due: 1 month, appt made Yes    [name], [name]  

## 2022-03-12 ENCOUNTER — Encounter (INDEPENDENT_AMBULATORY_CARE_PROVIDER_SITE_OTHER): Payer: Self-pay

## 2022-03-20 ENCOUNTER — Telehealth (INDEPENDENT_AMBULATORY_CARE_PROVIDER_SITE_OTHER): Payer: Managed Care, Other (non HMO) | Admitting: Family

## 2022-03-20 DIAGNOSIS — L259 Unspecified contact dermatitis, unspecified cause: Secondary | ICD-10-CM | POA: Diagnosis not present

## 2022-03-20 NOTE — Assessment & Plan Note (Signed)
Improving.  It is not clear what triggered this rash. Benadryl has been helping with the itching. Recommended that she apply betamethasone bid to affected areas- avoid contact with eyes. Call if symptoms worsen or if symptoms do not continue to improve.

## 2022-03-20 NOTE — Progress Notes (Signed)
MyChart Video Visit    Virtual Visit via Video Note   This visit type was conducted due to national recommendations for restrictions regarding the COVID-19 Pandemic (e.g. social distancing) in an effort to limit this patient's exposure and mitigate transmission in our community. This patient is at least at moderate risk for complications without adequate follow up. This format is felt to be most appropriate for this patient at this time. Physical exam was limited by quality of the video and audio technology used for the visit. CMA  was able to get the patient set up on a video visit.  Patient location: Home Patient and provider in visit Provider location: Office  I discussed the limitations of evaluation and management by telemedicine and the availability of in person appointments. The patient expressed understanding and agreed to proceed.  Visit Date: 03/20/2022  Today's healthcare provider: [name], NP     Subjective:    Patient ID: Tina Figueroa, female    DOB: 1981/03/08, 41 y.o.   MRN: [id number]  Chief Complaint  Patient presents with   Rash on Face    HPI Patient is in today for a virtual office visit  Rash: She complains of a rash on the side of her face with associated symptoms of itchiness. She was moving items that had soot on it and consequently, touched her face after coming in contact with the substance. She does not think she came into contact with poison ivy. She noticed bumps occurring on the side of her face after initial contact. Symptoms begun to appear on 03/12/2022. She is currently taking Benadryl which is improving her symptoms.   Past Medical History:  Diagnosis Date   B12 deficiency 04/09/2016   Choledocholithiasis    Cholelithiasis with acute cholecystitis 07/15/2020   Elevated blood pressure reading without diagnosis of hypertension    History of chicken pox    History of cholecystectomy 09/24/2020   History of ERCP 09/24/2020    Hypertension    Mild hyperlipidemia 03/18/2012   OSA (obstructive sleep apnea) 03/25/2012   Severe OSA per sleep study 10/13.    Palpitations    PCOS (polycystic ovarian syndrome)     Past Surgical History:  Procedure Laterality Date   ADENOIDECTOMY  1989   BIOPSY  07/18/2020   Procedure: BIOPSY;  Surgeon: Meridee Score Netty Starring., MD;  Location: [facility];  Service: Gastroenterology;;   CHOLECYSTECTOMY N/A 07/19/2020   Procedure: LAPAROSCOPIC CHOLECYSTECTOMY with intraoperative cholangiogram;  Surgeon: Luretha Murphy, MD;  Location: [facility];  Service: General;  Laterality: N/A;   DILATION AND CURETTAGE OF UTERUS     DILATION AND CURETTAGE OF UTERUS     ERCP N/A 07/15/2020   Procedure: ENDOSCOPIC RETROGRADE CHOLANGIOPANCREATOGRAPHY (ERCP);  Surgeon: Hilarie Fredrickson, MD;  Location: [facility];  Service: Endoscopy;  Laterality: N/A;   ERCP N/A 07/18/2020   Procedure: ENDOSCOPIC RETROGRADE CHOLANGIOPANCREATOGRAPHY (ERCP);  Surgeon: Lemar Lofty., MD;  Location: [facility];  Service: Gastroenterology;  Laterality: N/A;   REMOVAL OF STONES  07/18/2020   Procedure: REMOVAL OF STONES;  Surgeon: Meridee Score Netty Starring., MD;  Location: [facility];  Service: Gastroenterology;;   Dennison Mascot  07/18/2020   Procedure: Dennison Mascot;  Surgeon: Mansouraty, Netty Starring., MD;  Location: [facility];  Service: Gastroenterology;;   TONSILLECTOMY     WISDOM TOOTH EXTRACTION      Family History  Problem Relation Age of Onset   Heart disease Mother        CHF  Hypertension Mother    Depression Mother    Asthma Mother    Polycystic ovary syndrome Mother    Arthritis Father    Arthritis Maternal Grandmother    Cancer Maternal Grandmother        lung   Arthritis Paternal Grandmother    Cancer Paternal Grandmother        breast   Stroke Paternal Grandmother    Diabetes Paternal Grandmother    Colon cancer Paternal Grandfather 37   Prostate cancer Paternal Grandfather    Diabetes  Maternal Aunt    Arthritis Maternal Uncle    Cancer Paternal Aunt 72       breast   Diabetes Paternal Uncle    Cancer Other        aunt   Esophageal cancer Neg Hx    Inflammatory bowel disease Neg Hx    Liver disease Neg Hx    Pancreatic cancer Neg Hx    Stomach cancer Neg Hx     Social History   Socioeconomic History   Marital status: Married    Spouse name: Not on file   Number of children: 0   Years of education: Not on file   Highest education level: Not on file  Occupational History   Occupation: Control and instrumentation engineer: [employer]  Tobacco Use   Smoking status: Never   Smokeless tobacco: Never  Vaping Use   Vaping Use: Never used  Substance and Sexual Activity   Alcohol use: Yes    Comment: Rarely   Drug use: No   Sexual activity: Yes    Partners: Male  Other Topics Concern   Not on file  Social History Narrative   Regular exercise;  No   Caffeine Use:  1-2 daily   Works at [employer] (worker's comp insurance)   No children   Married   Some college   Plans to return to school in January for medical office administration   Lives with dad and husband and her Neurosurgeon      Social Determinants of Radio broadcast assistant Strain: Not on file  Food Insecurity: Not on file  Transportation Needs: Not on file  Physical Activity: Not on file  Stress: Not on file  Social Connections: Not on file  Intimate Partner Violence: Not on file    Outpatient Medications Prior to Visit  Medication Sig Dispense Refill   acetaminophen (TYLENOL) 500 MG tablet Take 2 tablets (1,000 mg total) by mouth every 6 (six) hours as needed for headache, mild pain or fever. 30 tablet 0   betamethasone dipropionate 0.05 % cream Apply topically 2 (two) times daily. 30 g 2   hydrOXYzine (VISTARIL) 25 MG capsule Take 1 capsule (25 mg total) by mouth every 8 (eight) hours as needed. 30 capsule 1   meloxicam (MOBIC) 7.5 MG tablet TAKE 1 TABLET BY MOUTH EVERY DAY 90 tablet 1    metoprolol succinate (TOPROL-XL) 100 MG 24 hr tablet TAKE 1 TABLET DAILY. TAKE WITH OR IMMEDIATELY FOLLOWING A MEAL 90 tablet 1   norethindrone-ethinyl estradiol-FE (JUNEL FE 1/20) 1-20 MG-MCG tablet Take 1 tablet by mouth daily. 84 tablet 3   Facility-Administered Medications Prior to Visit  Medication Dose Route Frequency Provider Last Rate Last Admin   cyanocobalamin ((VITAMIN B-12)) injection 1,000 mcg  1,000 mcg Intramuscular Q30 days [name], NP   1,000 mcg at 02/16/22 1433    Allergies  Allergen Reactions   Flagyl [Metronidazole Hcl]  Chalky and coated tongue   Metformin And Related Other (See Comments)    Nausea, diarrhea   Spironolactone     Increased menstrual bleeding.    Review of Systems  Skin:  Positive for itching (Side of Face) and rash (Side of Face).       Objective:    Physical Exam   Gen: Awake, alert, no acute distress Resp: Breathing is even and non-labored Psych: calm/pleasant demeanor Neuro: Alert and Oriented x 3, + facial symmetry, speech is clear. Skin: raised dry papules noted on chin and at corners of mouth      Assessment & Plan:   Problem List Items Addressed This Visit       Unprioritized   Contact dermatitis - Primary    Improving.  It is not clear what triggered this rash. Benadryl has been helping with the itching. Recommended that she apply betamethasone bid to affected areas- avoid contact with eyes. Call if symptoms worsen or if symptoms do not continue to improve.      No orders of the defined types were placed in this encounter.   I discussed the assessment and treatment plan with the patient. The patient was provided an opportunity to ask questions and all were answered. The patient agreed with the plan and demonstrated an understanding of the instructions.   The patient was advised to call back or seek an in-person evaluation if the symptoms worsen or if the condition fails to improve as anticipated.  I  provided 20 minutes of face-to-face time during this encounter.   I,Amber Collins,acting as a Neurosurgeon for [employer], NP.,have documented all relevant documentation on the behalf of [name], NP,as directed by  [name], NP while in the presence of [name], NP.   [name], NP [employer] [facility] [employer] [phone] (phone) [phone] (fax)  [facility]

## 2022-03-22 ENCOUNTER — Encounter: Payer: Self-pay | Admitting: Family

## 2022-03-23 ENCOUNTER — Ambulatory Visit: Payer: Managed Care, Other (non HMO) | Admitting: *Deleted

## 2022-03-23 ENCOUNTER — Encounter: Payer: Self-pay | Admitting: Family

## 2022-03-23 DIAGNOSIS — Z23 Encounter for immunization: Secondary | ICD-10-CM

## 2022-03-23 DIAGNOSIS — E538 Deficiency of other specified B group vitamins: Secondary | ICD-10-CM

## 2022-03-23 MED ORDER — CYANOCOBALAMIN 1000 MCG/ML IJ SOLN
1000.0000 ug | Freq: Once | INTRAMUSCULAR | Status: AC
  Administered 2022-03-23: 1000 ug via INTRAMUSCULAR

## 2022-03-23 NOTE — Progress Notes (Signed)
Patient here for monthly b12 injection and flu vaccine.  Original order: 03/14/21 B12 levels are acceptable.  Please continue your monthly injections.  B12 injection given in left deltoid and flu vaccine given in right deltoid.  Patient tolerated both injection and vaccine well.

## 2022-04-20 ENCOUNTER — Ambulatory Visit (INDEPENDENT_AMBULATORY_CARE_PROVIDER_SITE_OTHER): Payer: Managed Care, Other (non HMO) | Admitting: Family

## 2022-04-20 ENCOUNTER — Encounter: Payer: Self-pay | Admitting: Family

## 2022-04-20 VITALS — BP 124/72 | HR 100 | Temp 98.5°F | Resp 18 | Ht 64.0 in | Wt 321.0 lb

## 2022-04-20 DIAGNOSIS — L309 Dermatitis, unspecified: Secondary | ICD-10-CM

## 2022-04-20 DIAGNOSIS — J309 Allergic rhinitis, unspecified: Secondary | ICD-10-CM

## 2022-04-20 DIAGNOSIS — Z1159 Encounter for screening for other viral diseases: Secondary | ICD-10-CM

## 2022-04-20 DIAGNOSIS — I10 Essential (primary) hypertension: Secondary | ICD-10-CM | POA: Diagnosis not present

## 2022-04-20 DIAGNOSIS — E538 Deficiency of other specified B group vitamins: Secondary | ICD-10-CM

## 2022-04-20 DIAGNOSIS — F32A Depression, unspecified: Secondary | ICD-10-CM

## 2022-04-20 DIAGNOSIS — G4733 Obstructive sleep apnea (adult) (pediatric): Secondary | ICD-10-CM | POA: Diagnosis not present

## 2022-04-20 LAB — COMPREHENSIVE METABOLIC PANEL
ALT: 10 U/L (ref 0–35)
AST: 11 U/L (ref 0–37)
Albumin: 3.8 g/dL (ref 3.5–5.2)
Alkaline Phosphatase: 67 U/L (ref 39–117)
BUN: 10 mg/dL (ref 6–23)
CO2: 24 mEq/L (ref 19–32)
Calcium: 8.8 mg/dL (ref 8.4–10.5)
Chloride: 106 mEq/L (ref 96–112)
Creatinine, Ser: 0.78 mg/dL (ref 0.40–1.20)
GFR: 93.39 mL/min (ref >60.00)
Glucose, Bld: 95 mg/dL (ref 70–99)
Potassium: 3.9 mEq/L (ref 3.5–5.1)
Sodium: 139 mEq/L (ref 135–145)
Total Bilirubin: 0.5 mg/dL (ref 0.2–1.2)
Total Protein: 6.8 g/dL (ref 6.0–8.3)

## 2022-04-20 LAB — VITAMIN B12: Vitamin B-12: 431 pg/mL (ref 211–911)

## 2022-04-20 MED ORDER — CYANOCOBALAMIN 1000 MCG/ML IJ SOLN
1000.0000 ug | Freq: Once | INTRAMUSCULAR | Status: AC
  Administered 2022-04-20: 1000 ug via INTRAMUSCULAR

## 2022-04-20 NOTE — Progress Notes (Signed)
Subjective:   By signing my name below, I, Tina Figueroa, attest that this documentation has been prepared under the direction and in the presence of [place], NP 04/20/2022   Patient ID: Tina Figueroa, female    DOB: 09-04-1980, 41 y.o.   MRN: [id number]  Chief Complaint  Patient presents with   Follow-up    6 month     HPI Patient is in today for an office visit  Blood Pressure: As of today's visit, her blood pressure is elevated. She states that she has not taken her blood pressure medications prior to today's visit.  She usually takes her medication between 07:00 - 08:00. She recorded her blood pressure on 04/13/2022 and reports a reading of 120/87. She is currently taking 100 Mg of Metoprolol Succinate. Her blood pressure during today's visit is 124/72. BP Readings from Last 3 Encounters:  04/20/22 124/72  10/13/21 136/85  04/10/21 135/87   Pulse Readings from Last 3 Encounters:  04/20/22 100  10/13/21 (!) 105  04/10/21 97   Allergies: She denies of any fall/winter allergies. Her allergies worsen during the Spring time.   Sleep: She has not received her Cpap yet. She is waiting until she changes her insurance.   Vitamin B-12: She is requesting to receive a vitamin B-12 injection during today's visit.   Eczema: She believes that her facial symptoms are due to her eczema. Flare-ups primarily occur on her bicep and outer sides of her right leg. She applies the 0.05% Betamethasone Dipropionate on those areas as well as her face.   Mood: She states that her mood is fine.   Immunizations: She is UTD on her influenza vaccine. She is interested in receiving a HepC screening.   Health Maintenance Due  Topic Date Due   Hepatitis C Screening  Never done   COVID-19 Vaccine (3 - Moderna series) 04/14/2020    Past Medical History:  Diagnosis Date   B12 deficiency 04/09/2016   Choledocholithiasis    Cholelithiasis with acute cholecystitis 07/15/2020   Elevated  blood pressure reading without diagnosis of hypertension    Grief reaction 09/01/2021   History of chicken pox    History of cholecystectomy 09/24/2020   History of ERCP 09/24/2020   Hypertension    Mild hyperlipidemia 03/18/2012   OSA (obstructive sleep apnea) 03/25/2012   Severe OSA per sleep study 10/13.    Palpitations    PCOS (polycystic ovarian syndrome)     Past Surgical History:  Procedure Laterality Date   ADENOIDECTOMY  1989   BIOPSY  07/18/2020   Procedure: BIOPSY;  Surgeon: Tina Figueroa., MD;  Location: [facility];  Service: Gastroenterology;;   CHOLECYSTECTOMY N/A 07/19/2020   Procedure: LAPAROSCOPIC CHOLECYSTECTOMY with intraoperative cholangiogram;  Surgeon: Tina Hausen, MD;  Location: [facility];  Service: General;  Laterality: N/A;   DILATION AND CURETTAGE OF UTERUS     DILATION AND CURETTAGE OF UTERUS     ERCP N/A 07/15/2020   Procedure: ENDOSCOPIC RETROGRADE CHOLANGIOPANCREATOGRAPHY (ERCP);  Surgeon: Tina Shipper, MD;  Location: [facility];  Service: Endoscopy;  Laterality: N/A;   ERCP N/A 07/18/2020   Procedure: ENDOSCOPIC RETROGRADE CHOLANGIOPANCREATOGRAPHY (ERCP);  Surgeon: Tina Figueroa., MD;  Location: [facility];  Service: Gastroenterology;  Laterality: N/A;   REMOVAL OF STONES  07/18/2020   Procedure: REMOVAL OF STONES;  Surgeon: Tina Figueroa., MD;  Location: [facility];  Service: Gastroenterology;;   Joan Mayans  07/18/2020   Procedure: Joan Mayans;  Surgeon: Tina Figueroa.,  MD;  Location: [facility];  Service: Gastroenterology;;   TONSILLECTOMY     WISDOM TOOTH EXTRACTION      Family History  Problem Relation Age of Onset   Heart disease Mother        CHF   Hypertension Mother    Depression Mother    Asthma Mother    Polycystic ovary syndrome Mother    Arthritis Father    Arthritis Maternal Grandmother    Cancer Maternal Grandmother        lung   Arthritis Paternal Grandmother    Cancer Paternal  Grandmother        breast   Stroke Paternal Grandmother    Diabetes Paternal Grandmother    Colon cancer Paternal Grandfather 49   Prostate cancer Paternal Grandfather    Diabetes Maternal Aunt    Arthritis Maternal Uncle    Cancer Paternal Aunt 72       breast   Diabetes Paternal Uncle    Cancer Other        aunt   Esophageal cancer Neg Hx    Inflammatory bowel disease Neg Hx    Liver disease Neg Hx    Pancreatic cancer Neg Hx    Stomach cancer Neg Hx     Social History   Socioeconomic History   Marital status: Married    Spouse name: Not on file   Number of children: 0   Years of education: Not on file   Highest education level: Not on file  Occupational History   Occupation: Control and instrumentation engineer: [employer]  Tobacco Use   Smoking status: Never   Smokeless tobacco: Never  Vaping Use   Vaping Use: Never used  Substance and Sexual Activity   Alcohol use: Yes    Comment: Rarely   Drug use: No   Sexual activity: Yes    Partners: Male  Other Topics Concern   Not on file  Social History Narrative   Regular exercise;  No   Caffeine Use:  1-2 daily   Works at [employer] (worker's comp insurance)   No children   Married   Some college   Plans to return to school in January for medical office administration   Lives with dad and husband and her Neurosurgeon      Social Determinants of Radio broadcast assistant Strain: Not on file  Food Insecurity: Not on file  Transportation Needs: Not on file  Physical Activity: Not on file  Stress: Not on file  Social Connections: Not on file  Intimate Partner Violence: Not on file    Outpatient Medications Prior to Visit  Medication Sig Dispense Refill   acetaminophen (TYLENOL) 500 MG tablet Take 2 tablets (1,000 mg total) by mouth every 6 (six) hours as needed for headache, mild pain or fever. 30 tablet 0   betamethasone dipropionate 0.05 % cream Apply topically 2 (two) times daily. 30 g 2   hydrOXYzine (VISTARIL) 25  MG capsule Take 1 capsule (25 mg total) by mouth every 8 (eight) hours as needed. 30 capsule 1   meloxicam (MOBIC) 7.5 MG tablet TAKE 1 TABLET BY MOUTH EVERY DAY 90 tablet 1   metoprolol succinate (TOPROL-XL) 100 MG 24 hr tablet TAKE 1 TABLET DAILY. TAKE WITH OR IMMEDIATELY FOLLOWING A MEAL 90 tablet 1   norethindrone-ethinyl estradiol-FE (JUNEL FE 1/20) 1-20 MG-MCG tablet Take 1 tablet by mouth daily. 84 tablet 3   No facility-administered medications prior to visit.  Allergies  Allergen Reactions   Flagyl [Metronidazole Hcl]     Chalky and coated tongue   Metformin And Related Other (See Comments)    Nausea, diarrhea   Spironolactone     Increased menstrual bleeding.    ROS    See HPI Objective:    Physical Exam Constitutional:      General: She is not in acute distress.    Appearance: Normal appearance. She is not ill-appearing.  HENT:     Head: Normocephalic and atraumatic.     Right Ear: External ear normal.     Left Ear: External ear normal.  Eyes:     Extraocular Movements: Extraocular movements intact.     Pupils: Pupils are equal, round, and reactive to light.  Cardiovascular:     Rate and Rhythm: Normal rate and regular rhythm.     Heart sounds: Normal heart sounds. No murmur heard.    No gallop.  Pulmonary:     Effort: Pulmonary effort is normal. No respiratory distress.     Breath sounds: Normal breath sounds. No wheezing or rales.  Skin:    General: Skin is warm and dry.  Neurological:     Mental Status: She is alert and oriented to person, place, and time.  Psychiatric:        Mood and Affect: Mood normal.        Behavior: Behavior normal.        Judgment: Judgment normal.     BP 124/72   Pulse 100   Temp 98.5 F (36.9 C)   Resp 18   Ht _0  (1.626 m)   Wt (!) 321 lb (145.6 kg)   SpO2 100%   BMI 55.10 kg/m  Wt Readings from Last 3 Encounters:  04/20/22 (!) 321 lb (145.6 kg)  10/13/21 (!) 309 lb (140.2 kg)  04/10/21 (!) 310 lb (140.6  kg)       Assessment & Plan:   Problem List Items Addressed This Visit       Unprioritized   OSA (obstructive sleep apnea)    Advised pt to reach out to pulmonary to reorder CPAP now that she has new insuance.       HTN (hypertension) - Primary    Was 120/87 on 10/27 PM.  Initial bp today was elevated but repeat bp is WNL.  Continue current dose of metoprolol.  BP Readings from Last 3 Encounters:  04/20/22 124/72  10/13/21 136/85  04/10/21 135/87        Relevant Orders   Comp Met (CMET)   Eczema    Using betamethasone prn. Works well when she uses betamethasone.       Depression    Stable without medication.       B12 deficiency    B12 injection today.       Relevant Orders   B12   Allergic rhinitis    Stale currently.       Other Visit Diagnoses     Need for hepatitis C screening test       Relevant Orders   Hepatitis C Antibody      Meds ordered this encounter  Medications   cyanocobalamin (VITAMIN B12) injection 1,000 mcg    I, Nance Pear, NP, personally preformed the services described in this documentation.  All medical record entries made by the scribe were at my direction and in my presence.  I have reviewed the chart and discharge instructions (if applicable) and agree that the  record reflects my personal performance and is accurate and complete. 04/20/2022   I,Amber Collins,acting as a scribe for Nance Pear, NP.,have documented all relevant documentation on the behalf of Nance Pear, NP,as directed by  Nance Pear, NP while in the presence of Nance Pear, NP.    Nance Pear, NP

## 2022-04-20 NOTE — Assessment & Plan Note (Signed)
Advised pt to reach out to pulmonary to reorder CPAP now that she has new insuance.

## 2022-04-20 NOTE — Assessment & Plan Note (Signed)
Stable without medication  

## 2022-04-20 NOTE — Assessment & Plan Note (Addendum)
Was 120/87 on 10/27 PM.  Initial bp today was elevated but repeat bp is WNL.  Continue current dose of metoprolol.  BP Readings from Last 3 Encounters:  04/20/22 124/72  10/13/21 136/85  04/10/21 135/87

## 2022-04-20 NOTE — Assessment & Plan Note (Signed)
Stale currently.

## 2022-04-20 NOTE — Assessment & Plan Note (Signed)
Using betamethasone prn. Works well when she uses betamethasone.

## 2022-04-20 NOTE — Assessment & Plan Note (Signed)
B12 injection today 

## 2022-04-23 LAB — HEPATITIS C ANTIBODY: Hepatitis C Ab: NONREACTIVE

## 2022-05-11 ENCOUNTER — Other Ambulatory Visit: Payer: Self-pay | Admitting: Family

## 2022-05-25 ENCOUNTER — Ambulatory Visit (INDEPENDENT_AMBULATORY_CARE_PROVIDER_SITE_OTHER): Payer: Managed Care, Other (non HMO)

## 2022-05-25 DIAGNOSIS — E538 Deficiency of other specified B group vitamins: Secondary | ICD-10-CM

## 2022-05-25 MED ORDER — CYANOCOBALAMIN 1000 MCG/ML IJ SOLN
1000.0000 ug | Freq: Once | INTRAMUSCULAR | Status: AC
  Administered 2022-05-25: 1000 ug via INTRAMUSCULAR

## 2022-05-25 NOTE — Progress Notes (Signed)
Tina Figueroa is a 41 y.o. female presents to the office today for Monthly B12 injection, per physician's orders. Original order: 03/29/21: "Clinically stable. Continue b12 injections." B12 given IM was administered R Deltoid (location) today. Patient tolerated injection. Patient due for follow up labs/provider appt: No.

## 2022-06-08 ENCOUNTER — Telehealth: Payer: Managed Care, Other (non HMO) | Admitting: Physician Assistant

## 2022-06-08 DIAGNOSIS — J069 Acute upper respiratory infection, unspecified: Secondary | ICD-10-CM

## 2022-06-08 MED ORDER — BENZONATATE 100 MG PO CAPS: 100.0000 mg | ORAL_CAPSULE | Freq: Three times a day (TID) | ORAL | 0 refills | Status: DC | PRN

## 2022-06-08 MED ORDER — NAPROXEN 500 MG PO TABS: 500.0000 mg | ORAL_TABLET | Freq: Two times a day (BID) | ORAL | 0 refills | Status: DC

## 2022-06-08 MED ORDER — FLUTICASONE PROPIONATE 50 MCG/ACT NA SUSP: 2.0000 | Freq: Every day | NASAL | 0 refills | Status: AC

## 2022-06-08 NOTE — Progress Notes (Signed)
E-Visit for Upper Respiratory Infection   We are sorry you are not feeling well.  Here is how we plan to help!  Based on what you have shared with me, it looks like you may have a viral upper respiratory infection.  Upper respiratory infections are caused by a large number of viruses; however, rhinovirus is the most common cause.   Symptoms vary from person to person, with common symptoms including sore throat, cough, fatigue or lack of energy and feeling of general discomfort.  A low-grade fever of up to 100.4 may present, but is often uncommon.  Symptoms vary however, and are closely related to a person's age or underlying illnesses.  The most common symptoms associated with an upper respiratory infection are nasal discharge or congestion, cough, sneezing, headache and pressure in the ears and face.  These symptoms usually persist for about 3 to 10 days, but can last up to 2 weeks.  It is important to know that upper respiratory infections do not cause serious illness or complications in most cases.    Upper respiratory infections can be transmitted from person to person, with the most common method of transmission being a person's hands.  The virus is able to live on the skin and can infect other persons for up to 2 hours after direct contact.  Also, these can be transmitted when someone coughs or sneezes; thus, it is important to cover the mouth to reduce this risk.  To keep the spread of the illness at bay, good hand hygiene is very important.  This is an infection that is most likely caused by a virus. There are no specific treatments other than to help you with the symptoms until the infection runs its course.  We are sorry you are not feeling well.  Here is how we plan to help!   For nasal congestion, you may use an oral decongestants such as Mucinex D or if you have glaucoma or high blood pressure use plain Mucinex.  Saline nasal spray or nasal drops can help and can safely be used as often as  needed for congestion.  For your congestion, I have prescribed Fluticasone nasal spray one spray in each nostril twice a day  If you do not have a history of heart disease, hypertension, diabetes or thyroid disease, prostate/bladder issues or glaucoma, you may also use Sudafed to treat nasal congestion.  It is highly recommended that you consult with a pharmacist or your primary care physician to ensure this medication is safe for you to take.     If you have a cough, you may use cough suppressants such as Delsym and Robitussin.  If you have glaucoma or high blood pressure, you can also use Coricidin HBP.   For cough I have prescribed for you A prescription cough medication called Tessalon Perles 100 mg. You may take 1-2 capsules every 8 hours as needed for cough  If you have a sore or scratchy throat, use a saltwater gargle-  to  teaspoon of salt dissolved in a 4-ounce to 8-ounce glass of warm water.  Gargle the solution for approximately 15-30 seconds and then spit.  It is important not to swallow the solution.  You can also use throat lozenges/cough drops and Chloraseptic spray to help with throat pain or discomfort.  Warm or cold liquids can also be helpful in relieving throat pain.  For headache, pain or general discomfort, you can use Ibuprofen or Tylenol as directed.   Some authorities believe   that zinc sprays or the use of Echinacea may shorten the course of your symptoms.  I would recommend for you to take an at home Covid 19 test. Please let us know the test result and we can update care management plan for you.  HOME CARE Only take medications as instructed by your medical team. Be sure to drink plenty of fluids. Water is fine as well as fruit juices, sodas and electrolyte beverages. You may want to stay away from caffeine or alcohol. If you are nauseated, try taking small sips of liquids. How do you know if you are getting enough fluid? Your urine should be a pale yellow or almost  colorless. Get rest. Taking a steamy shower or using a humidifier may help nasal congestion and ease sore throat pain. You can place a towel over your head and breathe in the steam from hot water coming from a faucet. Using a saline nasal spray works much the same way. Cough drops, hard candies and sore throat lozenges may ease your cough. Avoid close contacts especially the very young and the elderly Cover your mouth if you cough or sneeze Always remember to wash your hands.   GET HELP RIGHT AWAY IF: You develop worsening fever. If your symptoms do not improve within 10 days You develop yellow or green discharge from your nose over 3 days. You have coughing fits You develop a severe head ache or visual changes. You develop shortness of breath, difficulty breathing or start having chest pain Your symptoms persist after you have completed your treatment plan  MAKE SURE YOU  Understand these instructions. Will watch your condition. Will get help right away if you are not doing well or get worse.  Thank you for choosing an e-visit.  Your e-visit answers were reviewed by a board certified advanced clinical practitioner to complete your personal care plan. Depending upon the condition, your plan could have included both over the counter or prescription medications.  Please review your pharmacy choice. Make sure the pharmacy is open so you can pick up prescription now. If there is a problem, you may contact your provider through [employer] and have the prescription routed to another pharmacy.  Your safety is important to [place]. If you have drug allergies check your prescription carefully.   For the next 24 hours you can use MyChart to ask questions about today's visit, request a non-urgent call back, or ask for a work or school excuse. You will get an email in the next two days asking about your experience. I hope that your e-visit has been valuable and will speed your recovery.  I have  spent 5 minutes in review of e-visit questionnaire, review and updating patient chart, medical decision making and response to patient.   Margaretann Loveless, PA-C

## 2022-06-12 ENCOUNTER — Telehealth (HOSPITAL_BASED_OUTPATIENT_CLINIC_OR_DEPARTMENT_OTHER): Payer: Self-pay

## 2022-06-13 ENCOUNTER — Inpatient Hospital Stay (HOSPITAL_BASED_OUTPATIENT_CLINIC_OR_DEPARTMENT_OTHER): Admission: RE | Admit: 2022-06-13 | Payer: Managed Care, Other (non HMO) | Source: Ambulatory Visit

## 2022-06-18 ENCOUNTER — Encounter: Payer: Self-pay | Admitting: Family

## 2022-06-18 DIAGNOSIS — Z3009 Encounter for other general counseling and advice on contraception: Secondary | ICD-10-CM

## 2022-06-19 MED ORDER — NORETHIN ACE-ETH ESTRAD-FE 1-20 MG-MCG PO TABS: 1.0000 | ORAL_TABLET | Freq: Every day | ORAL | 3 refills | Status: DC

## 2022-06-22 ENCOUNTER — Ambulatory Visit (INDEPENDENT_AMBULATORY_CARE_PROVIDER_SITE_OTHER): Payer: 59

## 2022-06-22 DIAGNOSIS — E538 Deficiency of other specified B group vitamins: Secondary | ICD-10-CM | POA: Diagnosis not present

## 2022-06-22 MED ORDER — CYANOCOBALAMIN 1000 MCG/ML IJ SOLN
1000.0000 ug | Freq: Once | INTRAMUSCULAR | Status: AC
  Administered 2022-06-22: 1000 ug via INTRAMUSCULAR

## 2022-06-22 NOTE — Progress Notes (Signed)
Tina Figueroa is a 42 y.o. female presents to the office today for Monthly B12 injection, per physician's orders. Original order: 03/29/21: "Clinically stable. Continue b12 injections." B12 1067mcg given IM was administered R  Deltoid (location) today. Patient tolerated injection. Patient due for follow up labs/provider appt: No.  Patient next injection due: 1 month, appt made Yes    [name], Kristine Garbe L

## 2022-07-27 ENCOUNTER — Ambulatory Visit (INDEPENDENT_AMBULATORY_CARE_PROVIDER_SITE_OTHER): Payer: 59

## 2022-07-27 DIAGNOSIS — E538 Deficiency of other specified B group vitamins: Secondary | ICD-10-CM

## 2022-07-27 MED ORDER — CYANOCOBALAMIN 1000 MCG/ML IJ SOLN
1000.0000 ug | Freq: Once | INTRAMUSCULAR | Status: AC
  Administered 2022-07-27: 1000 ug via INTRAMUSCULAR

## 2022-07-27 NOTE — Progress Notes (Signed)
Tina Figueroa is a 42 y.o. female presents to the office today for Monthly B12 injection, per physician's orders. Original order: 03/29/21: "Clinically stable. Continue b12 injections." B12 1029mg given IM was administered R  Deltoid (location) today. Patient tolerated injection. Patient due for follow up labs/provider appt: No.  Patient next injection due: 1 month, appt made Yes

## 2022-08-09 ENCOUNTER — Other Ambulatory Visit: Payer: Self-pay | Admitting: Family

## 2022-08-24 ENCOUNTER — Other Ambulatory Visit: Payer: Self-pay | Admitting: Family

## 2022-08-31 ENCOUNTER — Ambulatory Visit (INDEPENDENT_AMBULATORY_CARE_PROVIDER_SITE_OTHER): Payer: 59

## 2022-08-31 DIAGNOSIS — E538 Deficiency of other specified B group vitamins: Secondary | ICD-10-CM

## 2022-08-31 MED ORDER — CYANOCOBALAMIN 1000 MCG/ML IJ SOLN
1000.0000 ug | Freq: Once | INTRAMUSCULAR | Status: AC
  Administered 2022-08-31: 1000 ug via INTRAMUSCULAR

## 2022-08-31 NOTE — Progress Notes (Signed)
Tina Figueroa is a 42 y.o. female presents to the office today for Monthly B12 injection, per physician's orders. Original order: 03/29/21: "Clinically stable. Continue b12 injections." B12 1000mcg given IM was administered R  Deltoid (location) today. Patient tolerated injection. Patient due for follow up labs/provider appt: No.  Patient next injection due: 1 month, appt made Yes 

## 2022-10-05 ENCOUNTER — Ambulatory Visit (INDEPENDENT_AMBULATORY_CARE_PROVIDER_SITE_OTHER): Payer: 59

## 2022-10-05 DIAGNOSIS — E538 Deficiency of other specified B group vitamins: Secondary | ICD-10-CM

## 2022-10-05 MED ORDER — CYANOCOBALAMIN 1000 MCG/ML IJ SOLN
1000.0000 ug | Freq: Once | INTRAMUSCULAR | Status: AC
Start: 2022-10-05 — End: 2022-10-05
  Administered 2022-10-05: 1000 ug via INTRAMUSCULAR

## 2022-10-05 NOTE — Progress Notes (Signed)
Tina Figueroa is a 42 y.o. female presents to the office today for Monthly B12 injection, per physician's orders. Original order: 03/29/21: "Clinically stable. Continue b12 injections." B12 1000mcg given IM was administered R Deltoid (location) today. Patient tolerated injection. Patient due for follow up labs/provider appt: No. 

## 2022-10-19 ENCOUNTER — Encounter: Payer: Self-pay | Admitting: Family

## 2022-10-25 NOTE — Progress Notes (Signed)
Subjective:   By signing my name below, I, Tina Figueroa, attest that this documentation has been prepared under the direction and in the presence of [name], NP 10/26/22   Patient ID: Tina Figueroa, female    DOB: 04/04/1981, 42 y.o.   MRN: [id number]  Chief Complaint  Patient presents with   Annual Exam    HPI Patient is in today for a comprehensive physical exam.  Hypertension:  Her blood pressure was uncontrolled at initial check at 160/98, which she attributes to rushing to get ready this morning. When rechecked it was 152/95. She is compliant with 100 mg Metoprolol daily, that she typically takes in the morning with breakfast. She has not taken her Metoprolol today since she is fasting. She occasionally checks her blood pressure and notes it is also elevated at home. She previously used her wrist cuff, but since having wrist pain she has not checked it.   Allergies:  She has taken fluticasone propionate and loratadine which she reports did not help her much. She tried taking them together and separately and notes they have not worked.   Wrist pain: She complains of wrist pain which she believes to be carpal tunnel. For work she is constantly typing.  Foot pain: She complains of foot pain. She has been compliant with Meloxicam but notes it has not been helping in the last couple of months.   Acne: She reports her acne has been flaring up recently due to her PCOS. She is interested in a derm referral.   Acute:  She denies having any fever, new muscle pain, new moles, congestion, sinus pain, sore throat, chest pain, palpitations, cough, SOB, wheezing, n/v/d, constipation, blood in stool, dysuria, frequency, hematuria, or headaches at this time.  Immunizations: UTD on Tdap. Plans to get Covid and flu vaccine next season.    Diet: Healthier than before, eating more home cooked meals and more vegetables like cabbage.   Exercise: She has kettle bell weights that she  uses at home. She also walks up and down the stairs throughout the day while at work.   Last mammogram: 08/20/2020. Results were normal. Plans to schedule next soon.   Last pap: 09/14/2020. Results were normal. Plans to schedule next soon.   Vision/Dental: She is UTD on routine vision and dental care.  Past Medical History:  Diagnosis Date   B12 deficiency 04/09/2016   Choledocholithiasis    Cholelithiasis with acute cholecystitis 07/15/2020   Elevated blood pressure reading without diagnosis of hypertension    Grief reaction 09/01/2021   History of chicken pox    History of cholecystectomy 09/24/2020   History of ERCP 09/24/2020   Hypertension    Mild hyperlipidemia 03/18/2012   OSA (obstructive sleep apnea) 03/25/2012   Severe OSA per sleep study 10/13.    Palpitations    PCOS (polycystic ovarian syndrome)     Past Surgical History:  Procedure Laterality Date   ADENOIDECTOMY  1989   BIOPSY  07/18/2020   Procedure: BIOPSY;  Surgeon: Meridee Score Netty Starring., MD;  Location: [facility];  Service: Gastroenterology;;   CHOLECYSTECTOMY N/A 07/19/2020   Procedure: LAPAROSCOPIC CHOLECYSTECTOMY with intraoperative cholangiogram;  Surgeon: Luretha Murphy, MD;  Location: [facility];  Service: General;  Laterality: N/A;   DILATION AND CURETTAGE OF UTERUS     DILATION AND CURETTAGE OF UTERUS     ERCP N/A 07/15/2020   Procedure: ENDOSCOPIC RETROGRADE CHOLANGIOPANCREATOGRAPHY (ERCP);  Surgeon: Hilarie Fredrickson, MD;  Location: [facility];  Service: Endoscopy;  Laterality: N/A;   ERCP N/A 07/18/2020   Procedure: ENDOSCOPIC RETROGRADE CHOLANGIOPANCREATOGRAPHY (ERCP);  Surgeon: Lemar Lofty., MD;  Location: [facility];  Service: Gastroenterology;  Laterality: N/A;   REMOVAL OF STONES  07/18/2020   Procedure: REMOVAL OF STONES;  Surgeon: Mansouraty, Netty Starring., MD;  Location: [facility];  Service: Gastroenterology;;   Dennison Mascot  07/18/2020   Procedure: Dennison Mascot;  Surgeon:  Mansouraty, Netty Starring., MD;  Location: [facility];  Service: Gastroenterology;;   TONSILLECTOMY     WISDOM TOOTH EXTRACTION      Family History  Problem Relation Age of Onset   Heart disease Mother        CHF   Hypertension Mother    Depression Mother    Asthma Mother    Polycystic ovary syndrome Mother    Arthritis Father    Arthritis Maternal Grandmother    Cancer Maternal Grandmother        lung   Arthritis Paternal Grandmother    Cancer Paternal Grandmother        breast   Stroke Paternal Grandmother    Diabetes Paternal Grandmother    Colon cancer Paternal Grandfather 57   Prostate cancer Paternal Grandfather    Diabetes Maternal Aunt    Arthritis Maternal Uncle    Cancer Paternal Aunt 8       breast   Diabetes Paternal Uncle    Cancer Other        aunt   Esophageal cancer Neg Hx    Inflammatory bowel disease Neg Hx    Liver disease Neg Hx    Pancreatic cancer Neg Hx    Stomach cancer Neg Hx     Social History   Socioeconomic History   Marital status: Married    Spouse name: Not on file   Number of children: 0   Years of education: Not on file   Highest education level: Not on file  Occupational History   Occupation: Chartered certified accountant: [employer]  Tobacco Use   Smoking status: Never   Smokeless tobacco: Never  Vaping Use   Vaping Use: Never used  Substance and Sexual Activity   Alcohol use: Yes    Comment: Rarely   Drug use: No   Sexual activity: Yes    Partners: Male  Other Topics Concern   Not on file  Social History Narrative   Regular exercise;  No   Caffeine Use:  1-2 daily   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)   No children   Married   Some college   Plans to return to school in January for medical office administration   Lives with dad and husband and her Medical laboratory scientific officer      Social Determinants of Corporate investment banker Strain: Not on file  Food Insecurity: Not on file  Transportation Needs: Not on file   Physical Activity: Not on file  Stress: Not on file  Social Connections: Not on file  Intimate Partner Violence: Not on file    Outpatient Medications Prior to Visit  Medication Sig Dispense Refill   acetaminophen (TYLENOL) 500 MG tablet Take 2 tablets (1,000 mg total) by mouth every 6 (six) hours as needed for headache, mild pain or fever. 30 tablet 0   benzonatate (TESSALON) 100 MG capsule Take 1 capsule (100 mg total) by mouth 3 (three) times daily as needed. 30 capsule 0   betamethasone dipropionate 0.05 % cream Apply topically 2 (two)  times daily. 30 g 2   fluticasone (FLONASE) 50 MCG/ACT nasal spray Place 2 sprays into both nostrils daily. 16 g 0   hydrOXYzine (VISTARIL) 25 MG capsule Take 1 capsule (25 mg total) by mouth every 8 (eight) hours as needed. 30 capsule 1   metoprolol succinate (TOPROL-XL) 100 MG 24 hr tablet TAKE 1 TABLET DAILY. TAKE WITH OR IMMEDIATELY FOLLOWING A MEAL 90 tablet 1   norethindrone-ethinyl estradiol-FE (JUNEL FE 1/20) 1-20 MG-MCG tablet Take 1 tablet by mouth daily. 84 tablet 3   meloxicam (MOBIC) 7.5 MG tablet TAKE 1 TABLET BY MOUTH EVERY DAY 90 tablet 1   No facility-administered medications prior to visit.    Allergies  Allergen Reactions   Flagyl [Metronidazole Hcl]     Chalky and coated tongue   Metformin And Related Other (See Comments)    Nausea, diarrhea   Spironolactone     Increased menstrual bleeding.    Review of Systems  Musculoskeletal:  Positive for joint pain (wrist pain (carpal tunnel)) and myalgias (foot pain).       Objective:    Physical Exam Constitutional:      General: She is not in acute distress.    Appearance: Normal appearance. She is not ill-appearing.  HENT:     Head: Normocephalic and atraumatic.     Right Ear: Tympanic membrane, ear canal and external ear normal.     Left Ear: Tympanic membrane, ear canal and external ear normal.  Eyes:     Extraocular Movements: Extraocular movements intact.     Right  eye: No nystagmus.     Left eye: No nystagmus.     Pupils: Pupils are equal, round, and reactive to light.  Neck:     Thyroid: No thyroid tenderness.  Cardiovascular:     Rate and Rhythm: Normal rate and regular rhythm.     Heart sounds: Normal heart sounds. No murmur heard.    No gallop.  Pulmonary:     Effort: Pulmonary effort is normal. No respiratory distress.     Breath sounds: Normal breath sounds. No wheezing or rales.  Abdominal:     General: There is no distension.     Palpations: Abdomen is soft.     Tenderness: There is no abdominal tenderness. There is no guarding.  Musculoskeletal:     Comments: 5/5 strength in both upper and lower extremities  Lymphadenopathy:     Cervical: No cervical adenopathy.  Skin:    General: Skin is warm and dry.     Comments: Hirsuitism noted neck/chin Facial acne  Neurological:     Mental Status: She is alert and oriented to person, place, and time.     Deep Tendon Reflexes:     Reflex Scores:      Patellar reflexes are 2+ on the right side and 2+ on the left side. Psychiatric:        Judgment: Judgment normal.     BP (!) 152/95 (BP Location: Right Arm, Patient Position: Sitting)   Pulse 100   Temp 98 F (36.7 C)   Resp 18   Ht 5\' 4"  (1.626 m)   Wt (!) 303 lb (137.4 kg)   SpO2 98%   BMI 52.01 kg/m  Wt Readings from Last 3 Encounters:  10/26/22 (!) 303 lb (137.4 kg)  04/20/22 (!) 321 lb (145.6 kg)  10/13/21 (!) 309 lb (140.2 kg)       Assessment & Plan:  Preventative health care Assessment & Plan: Discussed diet/exercise/weight  loss. Vision/dental up to date.   Orders: -     VITAMIN D 25 Hydroxy (Vit-D Deficiency, Fractures) -     CBC with Differential/Platelet -     Lipid panel  Breast cancer screening by mammogram -     3D Screening Mammogram, Left and Right; Future  Routine general medical examination at a health care facility Assessment & Plan: Discussed diet/exercise/weight loss. Vision/dental up to date.     B12 deficiency Assessment & Plan: Continues monthly b12 shots.   Orders: -     Vitamin B12  Primary hypertension Assessment & Plan: BP Readings from Last 3 Encounters:  10/26/22 (!) 152/95  04/20/22 124/72  10/13/21 136/85   Uncontrolled. She continues toprol xl.  Will add amlodipine 5mg  once daily.   Orders: -     Comprehensive metabolic panel  Screening for diabetes mellitus -     Hemoglobin A1c  Acne, unspecified acne type -     Ambulatory referral to Dermatology  Morbid obesity due to excess calories (HCC) -     Amb Ref to Medical Weight Management  Bilateral carpal tunnel syndrome Assessment & Plan: D/c meloxicam, trial of celebrex. Recommended bilateral wrist splints at night and as able during the day.    Plantar fasciitis Assessment & Plan: D/c meloxicam, try celebrex.    Other orders -     Fish Oil; Take 1 capsule by mouth daily. -     Vitamin D3; Take 2 tablets (10,000 Units total) by mouth every other day.  Dispense: 30 tablet -     Celecoxib; Take 1 capsule (100 mg total) by mouth 2 (two) times daily.  Dispense: 60 capsule; Refill: 2 -     amLODIPine Besylate; Take 1 tablet (5 mg total) by mouth daily.  Dispense: 90 tablet; Refill: 1     I,Rachel Rivera,acting as a scribe for [name], NP.,have documented all relevant documentation on the behalf of [name], NP,as directed by  [name], NP while in the presence of [name], NP.   I, [name], NP, personally preformed the services described in this documentation.  All medical record entries made by the scribe were at my direction and in my presence.  I have reviewed the chart and discharge instructions (if applicable) and agree that the record reflects my personal performance and is accurate and complete. 10/26/22   [name], NP

## 2022-10-26 ENCOUNTER — Encounter: Payer: Self-pay | Admitting: Family

## 2022-10-26 ENCOUNTER — Ambulatory Visit (INDEPENDENT_AMBULATORY_CARE_PROVIDER_SITE_OTHER): Payer: 59 | Admitting: Family

## 2022-10-26 ENCOUNTER — Other Ambulatory Visit: Payer: Self-pay | Admitting: Family

## 2022-10-26 VITALS — BP 152/95 | HR 100 | Temp 98.0°F | Resp 18 | Ht 64.0 in | Wt 303.0 lb

## 2022-10-26 DIAGNOSIS — L709 Acne, unspecified: Secondary | ICD-10-CM | POA: Diagnosis not present

## 2022-10-26 DIAGNOSIS — E538 Deficiency of other specified B group vitamins: Secondary | ICD-10-CM

## 2022-10-26 DIAGNOSIS — M722 Plantar fascial fibromatosis: Secondary | ICD-10-CM | POA: Diagnosis not present

## 2022-10-26 DIAGNOSIS — Z0001 Encounter for general adult medical examination with abnormal findings: Secondary | ICD-10-CM

## 2022-10-26 DIAGNOSIS — Z13220 Encounter for screening for lipoid disorders: Secondary | ICD-10-CM

## 2022-10-26 DIAGNOSIS — Z0000 Encounter for general adult medical examination without abnormal findings: Secondary | ICD-10-CM

## 2022-10-26 DIAGNOSIS — Z131 Encounter for screening for diabetes mellitus: Secondary | ICD-10-CM

## 2022-10-26 DIAGNOSIS — G5603 Carpal tunnel syndrome, bilateral upper limbs: Secondary | ICD-10-CM

## 2022-10-26 DIAGNOSIS — I10 Essential (primary) hypertension: Secondary | ICD-10-CM

## 2022-10-26 DIAGNOSIS — Z1231 Encounter for screening mammogram for malignant neoplasm of breast: Secondary | ICD-10-CM

## 2022-10-26 LAB — CBC WITH DIFFERENTIAL/PLATELET
Basophils Absolute: 0.1 10*3/uL (ref 0.0–0.1)
Basophils Relative: 0.7 % (ref 0.0–3.0)
Eosinophils Absolute: 0.3 10*3/uL (ref 0.0–0.7)
Eosinophils Relative: 3.6 % (ref 0.0–5.0)
HCT: 43 % (ref 36.0–46.0)
Hemoglobin: 14.5 g/dL (ref 12.0–15.0)
Lymphocytes Relative: 27.1 % (ref 12.0–46.0)
Lymphs Abs: 2.4 10*3/uL (ref 0.7–4.0)
MCHC: 33.8 g/dL (ref 30.0–36.0)
MCV: 93.5 fl (ref 78.0–100.0)
Monocytes Absolute: 0.5 10*3/uL (ref 0.1–1.0)
Monocytes Relative: 5.3 % (ref 3.0–12.0)
Neutro Abs: 5.6 10*3/uL (ref 1.4–7.7)
Neutrophils Relative %: 63.3 % (ref 43.0–77.0)
Platelets: 285 10*3/uL (ref 150.0–400.0)
RBC: 4.6 Mil/uL (ref 3.87–5.11)
RDW: 12.7 % (ref 11.5–15.5)
WBC: 8.9 10*3/uL (ref 4.0–10.5)

## 2022-10-26 LAB — COMPREHENSIVE METABOLIC PANEL
ALT: 21 U/L (ref 0–35)
AST: 17 U/L (ref 0–37)
Albumin: 4 g/dL (ref 3.5–5.2)
Alkaline Phosphatase: 81 U/L (ref 39–117)
BUN: 9 mg/dL (ref 6–23)
CO2: 23 mEq/L (ref 19–32)
Calcium: 9.1 mg/dL (ref 8.4–10.5)
Chloride: 105 mEq/L (ref 96–112)
Creatinine, Ser: 0.79 mg/dL (ref 0.40–1.20)
GFR: 91.64 mL/min (ref >60.00)
Glucose, Bld: 83 mg/dL (ref 70–99)
Potassium: 3.7 mEq/L (ref 3.5–5.1)
Sodium: 139 mEq/L (ref 135–145)
Total Bilirubin: 0.8 mg/dL (ref 0.2–1.2)
Total Protein: 7.3 g/dL (ref 6.0–8.3)

## 2022-10-26 LAB — LIPID PANEL
Cholesterol: 208 mg/dL — ABNORMAL HIGH (ref 0–200)
HDL: 49.1 mg/dL (ref >39.00)
LDL Cholesterol: 144 mg/dL — ABNORMAL HIGH (ref 0–99)
NonHDL: 158.78
Total CHOL/HDL Ratio: 4
Triglycerides: 75 mg/dL (ref 0.0–149.0)
VLDL: 15 mg/dL (ref 0.0–40.0)

## 2022-10-26 LAB — HEMOGLOBIN A1C: Hgb A1c MFr Bld: 4.8 % (ref 4.6–6.5)

## 2022-10-26 LAB — VITAMIN D 25 HYDROXY (VIT D DEFICIENCY, FRACTURES): VITD: 60.53 ng/mL (ref 30.00–100.00)

## 2022-10-26 LAB — VITAMIN B12: Vitamin B-12: >1500 pg/mL — ABNORMAL HIGH (ref 211–911)

## 2022-10-26 MED ORDER — CYANOCOBALAMIN 1000 MCG/ML IJ SOLN
1000.0000 ug | Freq: Once | INTRAMUSCULAR | Status: AC
Start: 2022-10-26 — End: 2022-10-26
  Administered 2022-10-26: 1000 ug via INTRAMUSCULAR

## 2022-10-26 MED ORDER — CELECOXIB 100 MG PO CAPS: 100.0000 mg | ORAL_CAPSULE | Freq: Two times a day (BID) | ORAL | 2 refills | Status: DC

## 2022-10-26 MED ORDER — VITAMIN D3 75 MCG (3000 UT) PO TABS: 1.0000 | ORAL_TABLET | Freq: Every day | ORAL | Status: AC

## 2022-10-26 MED ORDER — AMLODIPINE BESYLATE 5 MG PO TABS: 5.0000 mg | ORAL_TABLET | Freq: Every day | ORAL | 1 refills | Status: DC

## 2022-10-26 MED ORDER — VITAMIN D3 125 MCG (5000 UT) PO TABS: 2.0000 | ORAL_TABLET | ORAL | Status: DC

## 2022-10-26 MED ORDER — FISH OIL 1000 MG PO CPDR: 1.0000 | DELAYED_RELEASE_CAPSULE | Freq: Every day | ORAL | Status: AC

## 2022-10-26 NOTE — Assessment & Plan Note (Addendum)
BP Readings from Last 3 Encounters:  10/26/22 (!) 152/95  04/20/22 124/72  10/13/21 136/85   Uncontrolled. She continues toprol xl.  Will add amlodipine 5mg  once daily.

## 2022-10-26 NOTE — Assessment & Plan Note (Signed)
D/c meloxicam, trial of celebrex. Recommended bilateral wrist splints at night and as able during the day.

## 2022-10-26 NOTE — Addendum Note (Signed)
Addended by: Maximino Sarin on: 10/26/2022 07:54 AM   Modules accepted: Orders

## 2022-10-26 NOTE — Assessment & Plan Note (Addendum)
Discussed diet/exercise/weight loss. Vision/dental up to date.

## 2022-10-26 NOTE — Assessment & Plan Note (Signed)
Continues monthly b12 shots. 

## 2022-10-26 NOTE — Assessment & Plan Note (Signed)
D/c meloxicam, try celebrex.

## 2022-11-02 ENCOUNTER — Ambulatory Visit: Payer: 59

## 2022-11-06 ENCOUNTER — Ambulatory Visit: Payer: 59 | Admitting: Family

## 2022-11-07 ENCOUNTER — Ambulatory Visit: Payer: 59 | Admitting: Family

## 2022-11-08 ENCOUNTER — Encounter (HOSPITAL_BASED_OUTPATIENT_CLINIC_OR_DEPARTMENT_OTHER): Payer: Self-pay

## 2022-11-08 ENCOUNTER — Ambulatory Visit (HOSPITAL_BASED_OUTPATIENT_CLINIC_OR_DEPARTMENT_OTHER)
Admission: RE | Admit: 2022-11-08 | Discharge: 2022-11-08 | Disposition: A | Discharge status: Home / Self Care | Payer: 59 | Source: Ambulatory Visit | Attending: Family | Admitting: Family

## 2022-11-08 DIAGNOSIS — Z1231 Encounter for screening mammogram for malignant neoplasm of breast: Secondary | ICD-10-CM | POA: Insufficient documentation

## 2022-11-09 ENCOUNTER — Ambulatory Visit: Payer: 59 | Admitting: Family

## 2022-11-13 ENCOUNTER — Telehealth: Payer: Self-pay | Admitting: Family

## 2022-11-13 ENCOUNTER — Ambulatory Visit (INDEPENDENT_AMBULATORY_CARE_PROVIDER_SITE_OTHER): Payer: 59 | Admitting: Family

## 2022-11-13 VITALS — BP 125/86 | HR 92 | Temp 98.5°F | Resp 16 | Wt 302.0 lb

## 2022-11-13 DIAGNOSIS — I10 Essential (primary) hypertension: Secondary | ICD-10-CM | POA: Diagnosis not present

## 2022-11-13 NOTE — Progress Notes (Signed)
Subjective:   By signing my name below, I, Tina Figueroa, attest that this documentation has been prepared under the direction and in the presence of Tina Fillers, NP 11/15/22   Patient ID: Tina Figueroa, female    DOB: 1980-08-18, 42 y.o.   MRN: [id number]  Chief Complaint  Patient presents with   Hypertension    Here for follow up    HPI Patient is in today for a 2 week follow-up.  Hypertension: Last visit we added amlodipine for blood pressure. She has been compliant with amlodipine 5mg  and metoprolol succinate 100 mg daily with or immediately following meal, amlodipine 5 mg daily. Blood pressure normal today at 125/86. No issues with amlodipine. Denies swelling in feet.  Past Medical History:  Diagnosis Date   B12 deficiency 04/09/2016   Choledocholithiasis    Cholelithiasis with acute cholecystitis 07/15/2020   Elevated blood pressure reading without diagnosis of hypertension    Grief reaction 09/01/2021   History of chicken pox    History of cholecystectomy 09/24/2020   History of ERCP 09/24/2020   Hypertension    Mild hyperlipidemia 03/18/2012   OSA (obstructive sleep apnea) 03/25/2012   Severe OSA per sleep study 10/13.    Palpitations    PCOS (polycystic ovarian syndrome)     Past Surgical History:  Procedure Laterality Date   ADENOIDECTOMY  1989   BIOPSY  07/18/2020   Procedure: BIOPSY;  Surgeon: Meridee Score Netty Starring., MD;  Location: [facility];  Service: Gastroenterology;;   CHOLECYSTECTOMY N/A 07/19/2020   Procedure: LAPAROSCOPIC CHOLECYSTECTOMY with intraoperative cholangiogram;  Surgeon: Luretha Murphy, MD;  Location: [facility];  Service: General;  Laterality: N/A;   DILATION AND CURETTAGE OF UTERUS     DILATION AND CURETTAGE OF UTERUS     ERCP N/A 07/15/2020   Procedure: ENDOSCOPIC RETROGRADE CHOLANGIOPANCREATOGRAPHY (ERCP);  Surgeon: Hilarie Fredrickson, MD;  Location: [facility];  Service: Endoscopy;  Laterality: N/A;   ERCP N/A 07/18/2020    Procedure: ENDOSCOPIC RETROGRADE CHOLANGIOPANCREATOGRAPHY (ERCP);  Surgeon: Lemar Lofty., MD;  Location: [facility];  Service: Gastroenterology;  Laterality: N/A;   REMOVAL OF STONES  07/18/2020   Procedure: REMOVAL OF STONES;  Surgeon: Meridee Score Netty Starring., MD;  Location: [facility];  Service: Gastroenterology;;   Dennison Mascot  07/18/2020   Procedure: Dennison Mascot;  Surgeon: Mansouraty, Netty Starring., MD;  Location: [facility];  Service: Gastroenterology;;   TONSILLECTOMY     WISDOM TOOTH EXTRACTION      Family History  Problem Relation Age of Onset   Heart disease Mother        CHF   Hypertension Mother    Depression Mother    Asthma Mother    Polycystic ovary syndrome Mother    Arthritis Father    Arthritis Maternal Grandmother    Cancer Maternal Grandmother        lung   Arthritis Paternal Grandmother    Cancer Paternal Grandmother        breast   Stroke Paternal Grandmother    Diabetes Paternal Grandmother    Colon cancer Paternal Grandfather 62   Prostate cancer Paternal Grandfather    Diabetes Maternal Aunt    Arthritis Maternal Uncle    Cancer Paternal Aunt 15       breast   Diabetes Paternal Uncle    Cancer Other        aunt   Esophageal cancer Neg Hx    Inflammatory bowel disease Neg Hx    Liver disease Neg Hx  Pancreatic cancer Neg Hx    Stomach cancer Neg Hx     Social History   Socioeconomic History   Marital status: Married    Spouse name: Not on file   Number of children: 0   Years of education: Not on file   Highest education level: Some college, no degree  Occupational History   Occupation: Chartered certified accountant: [employer]  Tobacco Use   Smoking status: Never   Smokeless tobacco: Never  Vaping Use   Vaping Use: Never used  Substance and Sexual Activity   Alcohol use: Yes    Comment: Rarely   Drug use: No   Sexual activity: Yes    Partners: Male  Other Topics Concern   Not on file  Social History  Narrative   Regular exercise;  No   Caffeine Use:  1-2 daily   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)   No children   Married   Some college   Plans to return to school in January for medical office administration   Lives with dad and husband and her cat      Social Determinants of Health   Financial Resource Strain: Low Risk  (11/06/2022)   Overall Financial Resource Strain (CARDIA)    Difficulty of Paying Living Expenses: Not very hard  Food Insecurity: No Food Insecurity (11/06/2022)   Hunger Vital Sign    Worried About Running Out of Food in the Last Year: Never true    Ran Out of Food in the Last Year: Never true  Transportation Needs: No Transportation Needs (11/06/2022)   PRAPARE - Administrator, Civil Service (Medical): No    Lack of Transportation (Non-Medical): No  Physical Activity: Insufficiently Active (11/06/2022)   Exercise Vital Sign    Days of Exercise per Week: 3 days    Minutes of Exercise per Session: 20 min  Stress: No Stress Concern Present (11/06/2022)   [employer] of Occupational Health - Occupational Stress Questionnaire    Feeling of Stress : Only a little  Social Connections: Moderately Isolated (11/06/2022)   Social Connection and Isolation Panel [NHANES]    Frequency of Communication with Friends and Family: More than three times a week    Frequency of Social Gatherings with Friends and Family: Twice a week    Attends Religious Services: Never    Database administrator or Organizations: No    Attends Engineer, structural: Not on file    Marital Status: Married  Catering manager Violence: Not on file    Outpatient Medications Prior to Visit  Medication Sig Dispense Refill   acetaminophen (TYLENOL) 500 MG tablet Take 2 tablets (1,000 mg total) by mouth every 6 (six) hours as needed for headache, mild pain or fever. 30 tablet 0   amLODipine (NORVASC) 5 MG tablet Take 1 tablet (5 mg total) by mouth daily. 90 tablet 1    betamethasone dipropionate 0.05 % cream Apply topically 2 (two) times daily. 30 g 2   celecoxib (CELEBREX) 100 MG capsule Take 1 capsule (100 mg total) by mouth 2 (two) times daily. 60 capsule 2   Cholecalciferol (VITAMIN D3) 75 MCG (3000 UT) TABS Take 1 tablet by mouth daily at 6 (six) AM. 30 tablet    fluticasone (FLONASE) 50 MCG/ACT nasal spray Place 2 sprays into both nostrils daily. 16 g 0   hydrOXYzine (VISTARIL) 25 MG capsule Take 1 capsule (25 mg total) by mouth every  8 (eight) hours as needed. 30 capsule 1   metoprolol succinate (TOPROL-XL) 100 MG 24 hr tablet TAKE 1 TABLET DAILY. TAKE WITH OR IMMEDIATELY FOLLOWING A MEAL 90 tablet 1   norethindrone-ethinyl estradiol-FE (JUNEL FE 1/20) 1-20 MG-MCG tablet Take 1 tablet by mouth daily. 84 tablet 3   Omega-3 Fatty Acids (FISH OIL) 1000 MG CPDR Take 1 capsule by mouth daily.     benzonatate (TESSALON) 100 MG capsule Take 1 capsule (100 mg total) by mouth 3 (three) times daily as needed. 30 capsule 0   No facility-administered medications prior to visit.    Allergies  Allergen Reactions   Flagyl [Metronidazole Hcl]     Chalky and coated tongue   Metformin And Related Other (See Comments)    Nausea, diarrhea   Spironolactone     Increased menstrual bleeding.    Review of Systems  Constitutional:  Negative for fever and malaise/fatigue.  HENT:  Negative for congestion.   Eyes:  Negative for blurred vision.  Respiratory:  Negative for cough and shortness of breath.   Cardiovascular:  Negative for chest pain, palpitations and leg swelling.  Gastrointestinal:  Negative for vomiting.  Musculoskeletal:  Negative for back pain.  Skin:  Negative for rash.  Neurological:  Negative for loss of consciousness and headaches.       Objective:    Physical Exam Vitals and nursing note reviewed.  Constitutional:      General: She is not in acute distress.    Appearance: She is well-developed. She is not ill-appearing or toxic-appearing.   Cardiovascular:     Rate and Rhythm: Normal rate and regular rhythm. No extrasystoles are present.    Pulses: Normal pulses.     Heart sounds: Normal heart sounds, S1 normal and S2 normal. No murmur heard.    No friction rub. No gallop.  Pulmonary:     Effort: Pulmonary effort is normal.     Breath sounds: Normal breath sounds.  Skin:    General: Skin is warm and dry.  Neurological:     Mental Status: She is alert.     GCS: GCS eye subscore is 4. GCS verbal subscore is 5. GCS motor subscore is 6.  Psychiatric:        Speech: Speech normal.        Behavior: Behavior normal. Behavior is cooperative.     BP 125/86 (BP Location: Right Arm, Patient Position: Sitting, Cuff Size: Large)   Pulse 92   Temp 98.5 F (36.9 C) (Oral)   Resp 16   Wt (!) 302 lb (137 kg)   LMP 10/21/2022   SpO2 98%   BMI 51.84 kg/m  Wt Readings from Last 3 Encounters:  11/13/22 (!) 302 lb (137 kg)  10/26/22 (!) 303 lb (137.4 kg)  04/20/22 (!) 321 lb (145.6 kg)       Assessment & Plan:  Primary hypertension Assessment & Plan: BP Readings from Last 3 Encounters:  11/13/22 125/86  10/26/22 (!) 152/95  04/20/22 124/72   BP stable/improved.  Continue current doses of metoprolol and amlodipine.        I,Alexander Ruley,acting as a Neurosurgeon for [employer], NP.,have documented all relevant documentation on the behalf of Tina Fillers, NP,as directed by  Tina Fillers, NP while in the presence of Tina Fillers, NP.

## 2022-11-13 NOTE — Telephone Encounter (Signed)
Patient states there were forms that she needs to pick up that are for her insurance. Unable to find them up-front. Please advise.

## 2022-11-14 NOTE — Telephone Encounter (Signed)
Forms given to patient yesterda

## 2022-11-15 NOTE — Assessment & Plan Note (Signed)
BP Readings from Last 3 Encounters:  11/13/22 125/86  10/26/22 (!) 152/95  04/20/22 124/72   BP stable/improved.  Continue current doses of metoprolol and amlodipine.

## 2022-11-19 ENCOUNTER — Encounter: Payer: Self-pay | Admitting: Family

## 2022-11-20 MED ORDER — HYDROXYZINE PAMOATE 25 MG PO CAPS: 25.0000 mg | ORAL_CAPSULE | Freq: Three times a day (TID) | ORAL | 1 refills | Status: DC | PRN

## 2022-11-29 ENCOUNTER — Other Ambulatory Visit: Payer: Self-pay | Admitting: Family

## 2022-11-29 ENCOUNTER — Encounter: Payer: Self-pay | Admitting: Family

## 2022-11-29 MED ORDER — CYANOCOBALAMIN 1000 MCG/ML IJ SOLN: 1000.0000 ug | INTRAMUSCULAR | 5 refills | Status: DC

## 2022-11-29 NOTE — Progress Notes (Deleted)
Tina Figueroa is a 42 y.o. female presents to the office today for Monthly B12 injection, per physician's orders. Original order: 10/26/22: B12 Deficiency Continues monthly b12 shots.  Cyanocobalamin 1000 mg/ml IM was administered *** deltoid today. Patient tolerated injection. Patient due for follow up labs/provider appt: {yes/no:20286}. Date due: ***, appt made {yes/no:20286} Patient next injection due: 1 month, appt made {yes/no:20286}  DOD:   [name], Feliberto Harts

## 2022-11-30 ENCOUNTER — Ambulatory Visit: Payer: 59

## 2022-11-30 DIAGNOSIS — E538 Deficiency of other specified B group vitamins: Secondary | ICD-10-CM

## 2022-11-30 NOTE — Telephone Encounter (Signed)
Patient reports her friend is a Engineer, civil (consulting) that used to work on ED. She is going to have her do the injections IM at home.

## 2023-01-16 ENCOUNTER — Encounter (INDEPENDENT_AMBULATORY_CARE_PROVIDER_SITE_OTHER): Payer: Self-pay

## 2023-01-17 ENCOUNTER — Other Ambulatory Visit: Payer: Self-pay | Admitting: Family

## 2023-01-19 ENCOUNTER — Other Ambulatory Visit: Payer: Self-pay | Admitting: Family

## 2023-02-05 ENCOUNTER — Other Ambulatory Visit: Payer: Self-pay | Admitting: Family

## 2023-02-07 ENCOUNTER — Other Ambulatory Visit: Payer: Self-pay | Admitting: Family

## 2023-02-15 ENCOUNTER — Ambulatory Visit: Payer: 59 | Admitting: Family

## 2023-02-19 ENCOUNTER — Ambulatory Visit: Payer: 59 | Admitting: Family

## 2023-03-03 ENCOUNTER — Other Ambulatory Visit: Payer: Self-pay | Admitting: Family

## 2023-03-03 DIAGNOSIS — Z3009 Encounter for other general counseling and advice on contraception: Secondary | ICD-10-CM

## 2023-03-05 ENCOUNTER — Ambulatory Visit (INDEPENDENT_AMBULATORY_CARE_PROVIDER_SITE_OTHER): Payer: 59 | Admitting: Family

## 2023-03-05 ENCOUNTER — Encounter: Payer: Self-pay | Admitting: Family

## 2023-03-05 VITALS — BP 131/83 | HR 83 | Temp 98.0°F | Resp 16 | Ht 64.0 in | Wt 304.0 lb

## 2023-03-05 DIAGNOSIS — I10 Essential (primary) hypertension: Secondary | ICD-10-CM | POA: Diagnosis not present

## 2023-03-05 DIAGNOSIS — Z23 Encounter for immunization: Secondary | ICD-10-CM

## 2023-03-05 DIAGNOSIS — G5603 Carpal tunnel syndrome, bilateral upper limbs: Secondary | ICD-10-CM

## 2023-03-05 DIAGNOSIS — K219 Gastro-esophageal reflux disease without esophagitis: Secondary | ICD-10-CM | POA: Diagnosis not present

## 2023-03-05 DIAGNOSIS — E538 Deficiency of other specified B group vitamins: Secondary | ICD-10-CM

## 2023-03-05 DIAGNOSIS — G4733 Obstructive sleep apnea (adult) (pediatric): Secondary | ICD-10-CM

## 2023-03-05 LAB — VITAMIN B12: Vitamin B-12: 407 pg/mL (ref 211–911)

## 2023-03-05 NOTE — Patient Instructions (Signed)
VISIT SUMMARY:  During your visit, we discussed your hypertension, anxiety, and vitamin B12 deficiency. Your blood pressure is well controlled with your current medications, amlodipine and metoprolol. You are also managing your anxiety well with occasional use of hydroxyzine. You are receiving monthly B12 injections for your vitamin B12 deficiency. We also discussed your general health maintenance, including your recent flu shot, upcoming COVID-19 booster, and potential Hepatitis B vaccination.  YOUR PLAN:  -HYPERTENSION: Hypertension, or high blood pressure, is well controlled with your current medications, amlodipine and metoprolol. You should continue taking these medications and we will check your blood pressure again in six months.  -ANXIETY: Your anxiety is being managed with occasional use of hydroxyzine. Continue to take this medication as needed.  -VITAMIN B12 DEFICIENCY: You have a vitamin B12 deficiency, which is being treated with monthly B12 injections. Continue these injections and we will check your B12 levels today.  -GENERAL HEALTH MAINTENANCE: You have received your flu shot and are scheduled for a COVID-19 booster. We discussed the Hepatitis B vaccine and decided it is up to you whether you want to receive it. You have completed your mammogram and need to schedule an OB appointment.  INSTRUCTIONS:  Please continue your current medications and treatments as discussed. Remember to schedule your OB appointment. We will see you again in six months for a follow-up appointment and to check your blood pressure.
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
## 2023-03-05 NOTE — Assessment & Plan Note (Signed)
Stable at this time. Continue on Amlodipine and Metoprolol

## 2023-03-05 NOTE — Assessment & Plan Note (Signed)
Stable at this time

## 2023-03-05 NOTE — Assessment & Plan Note (Signed)
No issues since gallbladder removal

## 2023-03-05 NOTE — Progress Notes (Signed)
Subjective:     Patient ID: Tina Figueroa, female    DOB: [birth year], 42 y.o.   MRN: [id number]  Chief Complaint  Patient presents with   Hypertension    Here for follow up, bp at home about 130/80    Hypertension Pertinent negatives include no chest pain, headaches, neck pain or palpitations.    Discussed the use of AI scribe software for clinical note transcription with the patient, who gave verbal consent to proceed.  42 year old female presents to the clinic today for follow up for blood pressure. She states that she is checking her blood pressure at home with no issues and that her average readings at home are around 130/80s. No swelling noted in feet and ankles being on the Amlodipine.   Denies any headaches or palpitations.   She states that she is trying to eat better in regards to the cholesterol levels. But she states that is hard sometimes but she is trying to stay active as much as possible.       Health Maintenance Due  Topic Date Due   INFLUENZA VACCINE  01/17/2023    Past Medical History:  Diagnosis Date   B12 deficiency 04/09/2016   Choledocholithiasis    Cholelithiasis with acute cholecystitis 07/15/2020   Elevated blood pressure reading without diagnosis of hypertension    Grief reaction 09/01/2021   History of chicken pox    History of cholecystectomy 09/24/2020   History of ERCP 09/24/2020   Hypertension    Mild hyperlipidemia 03/18/2012   OSA (obstructive sleep apnea) 03/25/2012   Severe OSA per sleep study 10/13.    Palpitations    PCOS (polycystic ovarian syndrome)     Past Surgical History:  Procedure Laterality Date   ADENOIDECTOMY  1989   BIOPSY  07/18/2020   Procedure: BIOPSY;  Surgeon: Meridee Score Netty Starring., MD;  Location: [facility];  Service: Gastroenterology;;   CHOLECYSTECTOMY N/A 07/19/2020   Procedure: LAPAROSCOPIC CHOLECYSTECTOMY with intraoperative cholangiogram;  Surgeon: Luretha Murphy, MD;  Location: [facility];  Service:  General;  Laterality: N/A;   DILATION AND CURETTAGE OF UTERUS     DILATION AND CURETTAGE OF UTERUS     ERCP N/A 07/15/2020   Procedure: ENDOSCOPIC RETROGRADE CHOLANGIOPANCREATOGRAPHY (ERCP);  Surgeon: Hilarie Fredrickson, MD;  Location: [facility];  Service: Endoscopy;  Laterality: N/A;   ERCP N/A 07/18/2020   Procedure: ENDOSCOPIC RETROGRADE CHOLANGIOPANCREATOGRAPHY (ERCP);  Surgeon: Lemar Lofty., MD;  Location: [facility];  Service: Gastroenterology;  Laterality: N/A;   REMOVAL OF STONES  07/18/2020   Procedure: REMOVAL OF STONES;  Surgeon: Meridee Score Netty Starring., MD;  Location: [facility];  Service: Gastroenterology;;   Dennison Mascot  07/18/2020   Procedure: Dennison Mascot;  Surgeon: Mansouraty, Netty Starring., MD;  Location: [facility];  Service: Gastroenterology;;   TONSILLECTOMY     WISDOM TOOTH EXTRACTION      Family History  Problem Relation Age of Onset   Heart disease Mother        CHF   Hypertension Mother    Depression Mother    Asthma Mother    Polycystic ovary syndrome Mother    Arthritis Father    Arthritis Maternal Grandmother    Cancer Maternal Grandmother        lung   Arthritis Paternal Grandmother    Cancer Paternal Grandmother        breast   Stroke Paternal Grandmother    Diabetes Paternal Grandmother    Colon cancer Paternal Grandfather 48  Prostate cancer Paternal Grandfather    Diabetes Maternal Aunt    Arthritis Maternal Uncle    Cancer Paternal Aunt 77       breast   Diabetes Paternal Uncle    Cancer Other        aunt   Esophageal cancer Neg Hx    Inflammatory bowel disease Neg Hx    Liver disease Neg Hx    Pancreatic cancer Neg Hx    Stomach cancer Neg Hx     Social History   Socioeconomic History   Marital status: Married    Spouse name: Not on file   Number of children: 0   Years of education: Not on file   Highest education level: Some college, no degree  Occupational History   Occupation: Chartered certified accountant:  [employer]  Tobacco Use   Smoking status: Never   Smokeless tobacco: Never  Vaping Use   Vaping status: Never Used  Substance and Sexual Activity   Alcohol use: Yes    Comment: Rarely   Drug use: No   Sexual activity: Yes    Partners: Male  Other Topics Concern   Not on file  Social History Narrative   Regular exercise;  No   Caffeine Use:  1-2 daily   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)   No children   Married   Some college   Plans to return to school in January for medical office administration   Lives with dad and husband and her cat      Social Determinants of Health   Financial Resource Strain: Low Risk  (11/06/2022)   Overall Financial Resource Strain (CARDIA)    Difficulty of Paying Living Expenses: Not very hard  Food Insecurity: No Food Insecurity (11/06/2022)   Hunger Vital Sign    Worried About Running Out of Food in the Last Year: Never true    Ran Out of Food in the Last Year: Never true  Transportation Needs: No Transportation Needs (11/06/2022)   PRAPARE - Administrator, Civil Service (Medical): No    Lack of Transportation (Non-Medical): No  Physical Activity: Insufficiently Active (11/06/2022)   Exercise Vital Sign    Days of Exercise per Week: 3 days    Minutes of Exercise per Session: 20 min  Stress: No Stress Concern Present (11/06/2022)   [employer] of Occupational Health - Occupational Stress Questionnaire    Feeling of Stress : Only a little  Social Connections: Moderately Isolated (11/06/2022)   Social Connection and Isolation Panel [NHANES]    Frequency of Communication with Friends and Family: More than three times a week    Frequency of Social Gatherings with Friends and Family: Twice a week    Attends Religious Services: Never    Database administrator or Organizations: No    Attends Engineer, structural: Not on file    Marital Status: Married  Catering manager Violence: Not on file    Outpatient  Medications Prior to Visit  Medication Sig Dispense Refill   acetaminophen (TYLENOL) 500 MG tablet Take 2 tablets (1,000 mg total) by mouth every 6 (six) hours as needed for headache, mild pain or fever. 30 tablet 0   amLODipine (NORVASC) 5 MG tablet Take 1 tablet (5 mg total) by mouth daily. 90 tablet 0   betamethasone dipropionate 0.05 % cream Apply topically 2 (two) times daily. 30 g 2   celecoxib (CELEBREX) 100 MG capsule  TAKE 1 CAPSULE BY MOUTH TWICE A DAY 60 capsule 2   Cholecalciferol (VITAMIN D3) 75 MCG (3000 UT) TABS Take 1 tablet by mouth daily at 6 (six) AM. 30 tablet    cyanocobalamin (VITAMIN B12) 1000 MCG/ML injection Inject 1 mL (1,000 mcg total) into the muscle every 30 (thirty) days. 1 mL 5   fluticasone (FLONASE) 50 MCG/ACT nasal spray Place 2 sprays into both nostrils daily. 16 g 0   hydrOXYzine (VISTARIL) 25 MG capsule TAKE 1 CAPSULE (25 MG TOTAL) BY MOUTH EVERY 8 (EIGHT) HOURS AS NEEDED. 30 capsule 1   JUNEL FE 1/20 1-20 MG-MCG tablet TAKE 1 TABLET BY MOUTH EVERY DAY 84 tablet 3   metoprolol succinate (TOPROL-XL) 100 MG 24 hr tablet TAKE 1 TABLET DAILY. TAKE WITH OR IMMEDIATELY FOLLOWING A MEAL 90 tablet 1   Omega-3 Fatty Acids (FISH OIL) 1000 MG CPDR Take 1 capsule by mouth daily.     No facility-administered medications prior to visit.    Allergies  Allergen Reactions   Flagyl [Metronidazole Hcl]     Chalky and coated tongue   Metformin And Related Other (See Comments)    Nausea, diarrhea   Spironolactone     Increased menstrual bleeding.    Review of Systems  Constitutional:  Negative for chills and diaphoresis.  HENT:  Negative for nosebleeds, sore throat and tinnitus.   Eyes:  Negative for double vision.  Respiratory:  Negative for cough and wheezing.   Cardiovascular:  Negative for chest pain and palpitations.  Gastrointestinal:  Negative for diarrhea, heartburn and nausea.  Musculoskeletal:  Negative for back pain and neck pain.  Neurological:  Negative  for dizziness and headaches.  Psychiatric/Behavioral:  Negative for depression, substance abuse and suicidal ideas.        Objective:    Physical Exam Constitutional:      Appearance: Normal appearance.  HENT:     Head: Normocephalic.  Cardiovascular:     Rate and Rhythm: Normal rate and regular rhythm.     Pulses: Normal pulses.  Pulmonary:     Effort: Pulmonary effort is normal.     Breath sounds: Normal breath sounds.  Musculoskeletal:        General: Normal range of motion.     Cervical back: Normal range of motion.  Skin:    General: Skin is warm.  Neurological:     General: No focal deficit present.     Mental Status: She is alert and oriented to person, place, and time. Mental status is at baseline.  Psychiatric:        Mood and Affect: Mood normal.        Behavior: Behavior normal.        Thought Content: Thought content normal.        Judgment: Judgment normal.      BP 131/83 (BP Location: Right Arm, Patient Position: Sitting, Cuff Size: Large)   Pulse 83   Temp 98 F (36.7 C) (Oral)   Resp 16   Ht 5\' 4"  (1.626 m)   Wt (!) 304 lb (137.9 kg)   SpO2 100%   BMI 52.18 kg/m  Wt Readings from Last 3 Encounters:  03/05/23 (!) 304 lb (137.9 kg)  11/13/22 (!) 302 lb (137 kg)  10/26/22 (!) 303 lb (137.4 kg)       Assessment & Plan:   Problem List Items Addressed This Visit   None   I am having Tina Figueroa maintain her acetaminophen, betamethasone dipropionate,  fluticasone, Fish Oil, Vitamin D3, cyanocobalamin, hydrOXYzine, celecoxib, metoprolol succinate, amLODipine, and Junel FE 1/20.  No orders of the defined types were placed in this encounter.

## 2023-03-05 NOTE — Assessment & Plan Note (Signed)
No longer using CPAP. Will follow up with pulmonary.

## 2023-03-06 ENCOUNTER — Encounter: Payer: Self-pay | Admitting: Family

## 2023-03-06 MED ORDER — CYANOCOBALAMIN 1000 MCG/ML IJ SOLN: 1000.0000 ug | INTRAMUSCULAR | 5 refills | Status: DC

## 2023-03-21 ENCOUNTER — Encounter: Payer: Self-pay | Admitting: Family

## 2023-04-22 ENCOUNTER — Other Ambulatory Visit: Payer: Self-pay | Admitting: Family

## 2023-05-07 ENCOUNTER — Other Ambulatory Visit: Payer: Self-pay | Admitting: Family

## 2023-08-04 ENCOUNTER — Other Ambulatory Visit: Payer: Self-pay | Admitting: Family

## 2023-08-14 ENCOUNTER — Other Ambulatory Visit: Payer: Self-pay | Admitting: Family

## 2023-09-06 ENCOUNTER — Ambulatory Visit: Payer: 59 | Admitting: Family

## 2023-10-11 ENCOUNTER — Other Ambulatory Visit: Payer: Self-pay | Admitting: Family

## 2023-11-17 ENCOUNTER — Other Ambulatory Visit: Payer: Self-pay | Admitting: Family

## 2023-12-19 ENCOUNTER — Other Ambulatory Visit: Payer: Self-pay | Admitting: Family

## 2023-12-19 DIAGNOSIS — Z3009 Encounter for other general counseling and advice on contraception: Secondary | ICD-10-CM

## 2024-01-13 ENCOUNTER — Other Ambulatory Visit: Payer: Self-pay | Admitting: Family

## 2024-01-13 DIAGNOSIS — Z3009 Encounter for other general counseling and advice on contraception: Secondary | ICD-10-CM

## 2024-01-18 ENCOUNTER — Other Ambulatory Visit: Payer: Self-pay | Admitting: Family

## 2024-01-20 ENCOUNTER — Encounter: Payer: Self-pay | Admitting: *Deleted
